# Patient Record
Sex: Male | Born: 2000
Health system: Southern US, Community
[De-identification: ages and names within clinical notes are randomized; demographics above are authoritative.]

## PROBLEM LIST (undated history)

## (undated) DIAGNOSIS — F32A Depression, unspecified: Secondary | ICD-10-CM

## (undated) DIAGNOSIS — K5090 Crohn's disease, unspecified, without complications: Secondary | ICD-10-CM

## (undated) DIAGNOSIS — T7840XA Allergy, unspecified, initial encounter: Secondary | ICD-10-CM

## (undated) DIAGNOSIS — H209 Unspecified iridocyclitis: Secondary | ICD-10-CM

## (undated) DIAGNOSIS — L309 Dermatitis, unspecified: Secondary | ICD-10-CM

## (undated) DIAGNOSIS — A281 Cat-scratch disease: Secondary | ICD-10-CM

## (undated) HISTORY — DX: Cat-scratch disease: A28.1

## (undated) HISTORY — DX: Allergy, unspecified, initial encounter: T78.40XA

## (undated) HISTORY — DX: Crohn's disease, unspecified, without complications: K50.90

---
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
## 2021-01-08 DIAGNOSIS — F4323 Adjustment disorder with mixed anxiety and depressed mood: Secondary | ICD-10-CM | POA: Diagnosis not present

## 2021-01-14 DIAGNOSIS — Z23 Encounter for immunization: Secondary | ICD-10-CM | POA: Diagnosis not present

## 2021-01-16 ENCOUNTER — Other Ambulatory Visit (HOSPITAL_BASED_OUTPATIENT_CLINIC_OR_DEPARTMENT_OTHER): Payer: Self-pay

## 2021-01-21 ENCOUNTER — Other Ambulatory Visit (INDEPENDENT_AMBULATORY_CARE_PROVIDER_SITE_OTHER): Payer: Self-pay | Admitting: Pediatric Gastroenterology

## 2021-01-21 MED FILL — buPROPion HCL ER (XL) 150 M: 150 | 30 days supply | Qty: 90 | Fill #1

## 2021-01-21 MED FILL — ESCITALOPRAM 10 MG TABLET: 10 | 30 days supply | Qty: 30 | Fill #2

## 2021-01-22 DIAGNOSIS — F4323 Adjustment disorder with mixed anxiety and depressed mood: Secondary | ICD-10-CM | POA: Diagnosis not present

## 2021-01-28 DIAGNOSIS — J343 Hypertrophy of nasal turbinates: Secondary | ICD-10-CM | POA: Diagnosis not present

## 2021-01-28 DIAGNOSIS — J310 Chronic rhinitis: Secondary | ICD-10-CM | POA: Diagnosis not present

## 2021-01-28 DIAGNOSIS — H9313 Tinnitus, bilateral: Secondary | ICD-10-CM | POA: Diagnosis not present

## 2021-01-29 ENCOUNTER — Other Ambulatory Visit (HOSPITAL_COMMUNITY): Payer: Self-pay

## 2021-02-02 ENCOUNTER — Other Ambulatory Visit (HOSPITAL_COMMUNITY): Payer: Self-pay

## 2021-02-02 MED FILL — Methotrexate Sodium Inj 50 MG/2ML (25 MG/ML): INTRAMUSCULAR | 28 days supply | Qty: 4 | Fill #0 | Status: AC

## 2021-02-16 ENCOUNTER — Encounter (INDEPENDENT_AMBULATORY_CARE_PROVIDER_SITE_OTHER): Payer: Self-pay

## 2021-02-16 ENCOUNTER — Telehealth (INDEPENDENT_AMBULATORY_CARE_PROVIDER_SITE_OTHER): Payer: Self-pay | Admitting: Pediatric Gastroenterology

## 2021-02-16 ENCOUNTER — Other Ambulatory Visit (HOSPITAL_COMMUNITY): Payer: Self-pay

## 2021-02-16 MED FILL — Escitalopram Oxalate Tab 10 MG (Base Equiv): ORAL | 30 days supply | Qty: 30 | Fill #0 | Status: AC

## 2021-02-16 MED FILL — Methotrexate Sodium Inj 50 MG/2ML (25 MG/ML): INTRAMUSCULAR | 84 days supply | Qty: 8 | Fill #1 | Status: AC

## 2021-02-16 MED FILL — Bupropion HCl Tab ER 24HR 150 MG: ORAL | 30 days supply | Qty: 90 | Fill #0 | Status: AC

## 2021-02-16 NOTE — Telephone Encounter (Signed)
Who's calling (name and relationship to patient) : [place] contact number: [phone]  Provider they see: Dr. Yehuda Savannah  Reason for call:  Pharmacy called in with questions regarding the rx dosing of methotrexate   Call ID:      PRESCRIPTION REFILL ONLY  Name of prescription: methotrexate 50 MG/2ML injection [[id number]]    Pharmacy:    [facility], [place] [address]  Phone:  [phone] Fax:  [phone]  DEA #:  [id number]

## 2021-02-16 NOTE — Telephone Encounter (Signed)
Returned call to pharmacy. Mom stated to the pharmacy that the 0.6 mL dose is not the right dose, and that it should be 0.8 mL. I relayed to the pharmacy that Billy Allen has not been seen since 06/16/2020, and according to the note, the dose is 15 mg (0.6 mL), so unless Billy Allen has seen another provider, the dose that is noted is 0.6 mL. Billy Allen has an appointment on 02/23/21, so the pharmacy is going to call mom and verify that he is not seeing another provider for the dosage change, and let them know that they need to keep their appointment next week.

## 2021-02-17 ENCOUNTER — Other Ambulatory Visit: Payer: Self-pay | Admitting: Otolaryngology

## 2021-02-17 NOTE — Telephone Encounter (Signed)
Mom sent back a My Chart message confirming dose. Billy Allen is taking the dosage as prescribed. Message from mom - Billy Allen is taking 15 mg which is .6 ml of methotrexate subcutaneous once a week. I think the refills in the past was a bit short. So this time we got 4 vials of 51m/2 ml so if none will be wasted during withdrawal then it should last 3 months.

## 2021-02-23 ENCOUNTER — Ambulatory Visit (INDEPENDENT_AMBULATORY_CARE_PROVIDER_SITE_OTHER): Payer: Self-pay | Admitting: Pediatric Gastroenterology

## 2021-02-25 ENCOUNTER — Ambulatory Visit (INDEPENDENT_AMBULATORY_CARE_PROVIDER_SITE_OTHER): Payer: 59 | Admitting: Adult Health

## 2021-02-25 ENCOUNTER — Encounter (INDEPENDENT_AMBULATORY_CARE_PROVIDER_SITE_OTHER): Payer: Self-pay

## 2021-02-25 ENCOUNTER — Other Ambulatory Visit (HOSPITAL_COMMUNITY): Payer: Self-pay

## 2021-02-25 ENCOUNTER — Other Ambulatory Visit (INDEPENDENT_AMBULATORY_CARE_PROVIDER_SITE_OTHER): Payer: Self-pay

## 2021-02-25 ENCOUNTER — Encounter: Payer: Self-pay | Admitting: Adult Health

## 2021-02-25 ENCOUNTER — Other Ambulatory Visit: Payer: Self-pay

## 2021-02-25 DIAGNOSIS — F331 Major depressive disorder, recurrent, moderate: Secondary | ICD-10-CM | POA: Diagnosis not present

## 2021-02-25 DIAGNOSIS — K5090 Crohn's disease, unspecified, without complications: Secondary | ICD-10-CM

## 2021-02-25 DIAGNOSIS — H209 Unspecified iridocyclitis: Secondary | ICD-10-CM | POA: Diagnosis not present

## 2021-02-25 MED ORDER — PREDNISOLONE ACETATE 1 % OP SUSP
OPHTHALMIC | 0 refills | Status: DC
  Filled 2021-02-25: qty 5, 4d supply, fill #0

## 2021-02-25 MED ORDER — ESCITALOPRAM OXALATE 10 MG PO TABS
ORAL_TABLET | Freq: Every day | ORAL | 5 refills | Status: DC
  Filled 2021-02-25: qty 30, 30d supply, fill #0
  Filled 2021-04-15: qty 30, 30d supply, fill #1

## 2021-02-25 MED ORDER — BUPROPION HCL ER (XL) 150 MG PO TB24
ORAL_TABLET | Freq: Every day | ORAL | 5 refills | Status: DC
  Filled 2021-02-25: qty 90, 30d supply, fill #0
  Filled 2021-04-15: qty 90, 30d supply, fill #1

## 2021-02-25 NOTE — Progress Notes (Signed)
Billy Allen [id number] [birth year] 20 y.o.  Subjective:   Patient ID:  Billy Allen is a 20 y.o. (DOB [birth year]) male.  Chief Complaint: No chief complaint on file.   HPI Billy Allen presents to the office today for follow-up of MDD.   Describes mood today as "ok". Denies tearfulness. Mood symptoms - reports decreased depression - "still somewhat there". Denies anxiety and irritability. Decreased intrusive thoughts. Has been journaling. Has set some goals, but has difficulties taking action. Stating "I have not set up an environment and lifestyle that would be conducive to mood maintenance". Plans to work on himself over the summer to be better prepared for fall semester. Seeing therapist at [place]. Taking medications as prescribed.  Energy levels stable. Active, has started going to the gym - taking some time off for finals.  Enjoys some usual interests and activities. Live alone in residence hall. Family local. Spending time with family and friends. Appetite adequate. Weight gain - noticing more body fat. Sleeps well most nights. Averages 5 to 6 hours with completing semester.  Focus and concentration about the same. Completing tasks. Managing aspects of household. Student - freshman at [employer]. Denies SI or HI. Passive ideation - "less" frequent. Denies AH or VH.  Previous medication trials: Denies  Review of Systems:  Review of Systems  Musculoskeletal: Negative for gait problem.  Neurological: Negative for tremors.  Psychiatric/Behavioral:       Please refer to HPI    Medications: I have reviewed the patient's current medications.  Current Outpatient Medications  Medication Sig Dispense Refill  . B-D 3CC LUER-LOK SYR 25GX5/8" 25G X 5/8" 3 ML MISC USE TO INJECT METHOTREXATE INTO THE SKIN ONCE A WEEK.    Marland Kitchen buPROPion (WELLBUTRIN XL) 150 MG 24 hr tablet TAKE 3 TABLETS BY MOUTH ONCE A DAY 90 tablet 5  . clindamycin-benzoyl peroxide (BENZACLIN) gel Apply 1  application topically daily.  3  . clindamycin-benzoyl peroxide (BENZACLIN) gel APPLY TO ACNE AREAS ON SKIN IN THE MORNING 50 g 11  . clobetasol ointment (TEMOVATE) 0.05 % APPLY TO THE AFFECTED AREA(S) TWICE DAILY 60 g 3  . Dapsone 5 % topical gel APPLY 1 APPLICATION ON THE SKIN IN THE MORNING 60 g 1  . dupilumab (DUPIXENT) 300 MG/2ML prefilled syringe Inject 300 mg into the skin once.    Marland Kitchen EPINEPHrine 0.3 mg/0.3 mL IJ SOAJ injection Inject into the muscle as directed.    . escitalopram (LEXAPRO) 10 MG tablet TAKE 1 TABLET BY MOUTH DAILY 30 tablet 5  . FOLIC ACID PO Take by mouth.    . levocetirizine (XYZAL) 5 MG tablet TAKE 1 TABLET BY MOUTH EVERY EVENING 30 tablet 5  . loratadine (CLARITIN REDITABS) 10 MG dissolvable tablet Take by mouth. (Patient not taking: Reported on 06/16/2020)    . methotrexate 50 MG/2ML injection INJECT 0.6 ML INTO THE SKIN ONCE A WEEK DISCARD VIAL 28 DAYS AFTER INITIAL USE 8 mL 1  . tacrolimus (PROTOPIC) 0.1 % ointment APPLY TO THE ECZEMA PRONE AREAS DAILY 60 g 2  . tretinoin (RETIN-A) 0.05 % cream Apply topically.    Marland Kitchen VITAMIN D PO Take by mouth.     No current facility-administered medications for this visit.    Medication Side Effects: None  Allergies: No Known Allergies  Past Medical History:  Diagnosis Date  . Crohn's disease ([place])   . Eczema     Past Medical History, Surgical history, Social history, and Family history were  reviewed and updated as appropriate.   Please see review of systems for further details on the patient's review from today.   Objective:   Physical Exam:  There were no vitals taken for this visit.  Physical Exam Constitutional:      General: He is not in acute distress. Musculoskeletal:        General: No deformity.  Neurological:     Mental Status: He is alert and oriented to person, place, and time.     Coordination: Coordination normal.  Psychiatric:        Attention and Perception: Attention and perception normal.  He does not perceive auditory or visual hallucinations.        Mood and Affect: Mood normal. Mood is not anxious or depressed. Affect is not labile, blunt, angry or inappropriate.        Speech: Speech normal.        Behavior: Behavior normal.        Thought Content: Thought content normal. Thought content is not paranoid or delusional. Thought content does not include homicidal or suicidal ideation. Thought content does not include homicidal or suicidal plan.        Cognition and Memory: Cognition and memory normal.        Judgment: Judgment normal.     Comments: Insight intact     Lab Review:     Component Value Date/Time   NA 141 10/27/2020 0925   K 3.8 10/27/2020 0925   CL 105 10/27/2020 0925   CO2 26 10/27/2020 0925   GLUCOSE 76 10/27/2020 0925   BUN 14 10/27/2020 0925   CREATININE 0.79 10/27/2020 0925   CALCIUM 9.8 10/27/2020 0925   PROT 7.4 10/27/2020 0925   ALBUMIN 3.0 (L) 11/24/2019 1315   AST 20 10/27/2020 0925   ALT 22 10/27/2020 0925   ALKPHOS 150 11/24/2019 1315   BILITOT 0.6 10/27/2020 0925   GFRNONAA NOT CALCULATED 11/26/2019 0649   GFRAA NOT CALCULATED 11/26/2019 0649       Component Value Date/Time   WBC 8.3 10/27/2020 0925   RBC 4.79 10/27/2020 0925   HGB 14.5 10/27/2020 0925   HCT 44.4 10/27/2020 0925   PLT 377 10/27/2020 0925   MCV 92.7 10/27/2020 0925   MCH 30.3 10/27/2020 0925   MCHC 32.7 10/27/2020 0925   RDW 12.1 10/27/2020 0925   LYMPHSABS 2,117 10/27/2020 0925   MONOABS 0.6 11/27/2019 1549   EOSABS 506 (H) 10/27/2020 0925   BASOSABS 91 10/27/2020 0925    No results found for: POCLITH, LITHIUM   No results found for: PHENYTOIN, PHENOBARB, VALPROATE, CBMZ   .res Assessment: Plan:     Plan:  1. Wellbutrin XL 469m daily. 2. Lexapro 110mdaily  Read and reviewed note with patient for accuracy.   RTC 8/10 weeks  Patient advised to contact office with any questions, adverse effects, or acute worsening in signs and  symptoms.     Diagnoses and all orders for this visit:  Major depressive disorder, recurrent episode, moderate (HCC) -     buPROPion (WELLBUTRIN XL) 150 MG 24 hr tablet; TAKE 3 TABLETS BY MOUTH ONCE A DAY -     escitalopram (LEXAPRO) 10 MG tablet; TAKE 1 TABLET BY MOUTH DAILY     Please see After Visit Summary for patient specific instructions.  Future Appointments  Date Time Provider DeR[place]8/01/2021  9:00 AM Brody Bonneau, ReBerdie OgrenNP CP-CP None  06/15/2021  2:30 PM SyKandis BanMD PS-[facility]  No orders of the defined types were placed in this encounter.   -------------------------------

## 2021-02-26 ENCOUNTER — Other Ambulatory Visit (INDEPENDENT_AMBULATORY_CARE_PROVIDER_SITE_OTHER): Payer: Self-pay

## 2021-02-26 DIAGNOSIS — K50118 Crohn's disease of large intestine with other complication: Secondary | ICD-10-CM

## 2021-02-26 DIAGNOSIS — K5090 Crohn's disease, unspecified, without complications: Secondary | ICD-10-CM

## 2021-02-26 LAB — COMPREHENSIVE METABOLIC PANEL
AG Ratio: 1.7 (calc) (ref 1.0–2.5)
ALT: 157 U/L — ABNORMAL HIGH (ref 8–46)
AST: 72 U/L — ABNORMAL HIGH (ref 12–32)
Albumin: 4.5 g/dL (ref 3.6–5.1)
Alkaline phosphatase (APISO): 112 U/L (ref 46–169)
BUN: 11 mg/dL (ref 7–20)
CO2: 26 mmol/L (ref 20–32)
Calcium: 9.4 mg/dL (ref 8.9–10.4)
Chloride: 104 mmol/L (ref 98–110)
Creat: 0.77 mg/dL (ref 0.60–1.26)
Globulin: 2.7 g/dL (ref 2.1–3.5)
Glucose, Bld: 112 mg/dL (ref 65–139)
Potassium: 4 mmol/L (ref 3.8–5.1)
Sodium: 140 mmol/L (ref 135–146)
Total Bilirubin: 0.3 mg/dL (ref 0.2–1.1)
Total Protein: 7.2 g/dL (ref 6.3–8.2)

## 2021-02-26 LAB — CBC WITH DIFFERENTIAL/PLATELET
Absolute Monocytes: 703 {cells}/uL (ref 200–900)
Basophils Absolute: 57 {cells}/uL (ref 0–200)
Basophils Relative: 0.6 %
Eosinophils Absolute: 333 {cells}/uL (ref 15–500)
Eosinophils Relative: 3.5 %
HCT: 39.4 % (ref 36.0–49.0)
Hemoglobin: 12.9 g/dL (ref 12.0–16.9)
Lymphs Abs: 2214 {cells}/uL (ref 1200–5200)
MCH: 31.2 pg (ref 25.0–35.0)
MCHC: 32.7 g/dL (ref 31.0–36.0)
MCV: 95.4 fL (ref 78.0–98.0)
MPV: 9.2 fL (ref 7.5–12.5)
Monocytes Relative: 7.4 %
Neutro Abs: 6194 {cells}/uL (ref 1800–8000)
Neutrophils Relative %: 65.2 %
Platelets: 347 10*3/uL (ref 140–400)
RBC: 4.13 10*6/uL (ref 4.10–5.70)
RDW: 11.4 % (ref 11.0–15.0)
Total Lymphocyte: 23.3 %
WBC: 9.5 10*3/uL (ref 4.5–13.0)

## 2021-02-26 LAB — SEDIMENTATION RATE: Sed Rate: 6 mm/h (ref 0–15)

## 2021-02-26 LAB — GAMMA GT: GGT: 123 U/L — ABNORMAL HIGH (ref 9–31)

## 2021-02-26 LAB — C-REACTIVE PROTEIN: CRP: 9.1 mg/L — ABNORMAL HIGH (ref <8.0)

## 2021-02-27 ENCOUNTER — Other Ambulatory Visit (HOSPITAL_COMMUNITY): Payer: Self-pay

## 2021-02-27 DIAGNOSIS — H209 Unspecified iridocyclitis: Secondary | ICD-10-CM | POA: Diagnosis not present

## 2021-02-27 DIAGNOSIS — K50118 Crohn's disease of large intestine with other complication: Secondary | ICD-10-CM | POA: Diagnosis not present

## 2021-02-27 MED ORDER — PREDNISOLONE ACETATE 1 % OP SUSP
OPHTHALMIC | 0 refills | Status: DC
  Filled 2021-02-27: qty 5, 7d supply, fill #0

## 2021-02-28 ENCOUNTER — Other Ambulatory Visit (HOSPITAL_COMMUNITY): Payer: Self-pay

## 2021-03-03 ENCOUNTER — Other Ambulatory Visit (HOSPITAL_COMMUNITY): Payer: Self-pay

## 2021-03-04 ENCOUNTER — Encounter (HOSPITAL_BASED_OUTPATIENT_CLINIC_OR_DEPARTMENT_OTHER): Payer: Self-pay | Admitting: Otolaryngology

## 2021-03-04 ENCOUNTER — Other Ambulatory Visit: Payer: Self-pay

## 2021-03-06 ENCOUNTER — Other Ambulatory Visit (HOSPITAL_COMMUNITY)
Admission: RE | Admit: 2021-03-06 | Discharge: 2021-03-06 | Disposition: A | Discharge status: Home / Self Care | Payer: 59 | Source: Ambulatory Visit | Attending: Otolaryngology | Admitting: Otolaryngology

## 2021-03-06 DIAGNOSIS — Z20822 Contact with and (suspected) exposure to covid-19: Secondary | ICD-10-CM | POA: Diagnosis not present

## 2021-03-06 DIAGNOSIS — Z01812 Encounter for preprocedural laboratory examination: Secondary | ICD-10-CM | POA: Insufficient documentation

## 2021-03-06 DIAGNOSIS — H209 Unspecified iridocyclitis: Secondary | ICD-10-CM | POA: Diagnosis not present

## 2021-03-06 DIAGNOSIS — H16002 Unspecified corneal ulcer, left eye: Secondary | ICD-10-CM | POA: Diagnosis not present

## 2021-03-07 LAB — CALPROTECTIN: Calprotectin: 1710 ug/g — ABNORMAL HIGH

## 2021-03-07 LAB — SARS CORONAVIRUS 2 (TAT 6-24 HRS): SARS Coronavirus 2: NEGATIVE

## 2021-03-10 ENCOUNTER — Ambulatory Visit (HOSPITAL_BASED_OUTPATIENT_CLINIC_OR_DEPARTMENT_OTHER): Payer: 59 | Admitting: Anesthesiology

## 2021-03-10 ENCOUNTER — Ambulatory Visit (HOSPITAL_BASED_OUTPATIENT_CLINIC_OR_DEPARTMENT_OTHER)
Admission: RE | Admit: 2021-03-10 | Discharge: 2021-03-10 | Disposition: A | Discharge status: Home / Self Care | Payer: 59 | Attending: Otolaryngology | Admitting: Otolaryngology

## 2021-03-10 ENCOUNTER — Other Ambulatory Visit: Payer: Self-pay

## 2021-03-10 ENCOUNTER — Encounter (HOSPITAL_BASED_OUTPATIENT_CLINIC_OR_DEPARTMENT_OTHER)
Admission: RE | Disposition: A | Discharge status: Home / Self Care | Payer: Self-pay | Source: Home / Self Care | Attending: Otolaryngology

## 2021-03-10 ENCOUNTER — Encounter (HOSPITAL_BASED_OUTPATIENT_CLINIC_OR_DEPARTMENT_OTHER): Payer: Self-pay | Admitting: Otolaryngology

## 2021-03-10 ENCOUNTER — Other Ambulatory Visit (HOSPITAL_COMMUNITY): Payer: Self-pay

## 2021-03-10 DIAGNOSIS — J343 Hypertrophy of nasal turbinates: Secondary | ICD-10-CM | POA: Insufficient documentation

## 2021-03-10 DIAGNOSIS — J310 Chronic rhinitis: Secondary | ICD-10-CM | POA: Insufficient documentation

## 2021-03-10 DIAGNOSIS — J3489 Other specified disorders of nose and nasal sinuses: Secondary | ICD-10-CM | POA: Diagnosis not present

## 2021-03-10 DIAGNOSIS — F32A Depression, unspecified: Secondary | ICD-10-CM | POA: Diagnosis not present

## 2021-03-10 DIAGNOSIS — L309 Dermatitis, unspecified: Secondary | ICD-10-CM | POA: Diagnosis not present

## 2021-03-10 HISTORY — DX: Unspecified iridocyclitis: H20.9

## 2021-03-10 HISTORY — DX: Depression, unspecified: F32.A

## 2021-03-10 HISTORY — PX: TURBINATE REDUCTION: SHX6157

## 2021-03-10 SURGERY — REDUCTION, NASAL TURBINATE
Anesthesia: General | Site: Nose | Laterality: Bilateral

## 2021-03-10 MED ORDER — PROMETHAZINE HCL 25 MG/ML IJ SOLN
6.2500 mg | INTRAMUSCULAR | Status: DC | PRN
Start: 2021-03-10 — End: 2021-03-10

## 2021-03-10 MED ORDER — CEFAZOLIN SODIUM-DEXTROSE 2-3 GM-%(50ML) IV SOLR
INTRAVENOUS | Status: DC | PRN
  Administered 2021-03-10: 2 g via INTRAVENOUS

## 2021-03-10 MED ORDER — ONDANSETRON HCL 4 MG/2ML IJ SOLN
INTRAMUSCULAR | Status: DC | PRN
  Administered 2021-03-10: 4 mg via INTRAVENOUS

## 2021-03-10 MED ORDER — OXYCODONE-ACETAMINOPHEN 5-325 MG PO TABS
1.0000 | ORAL_TABLET | ORAL | 0 refills | Status: AC | PRN
  Filled 2021-03-10: qty 12, 2d supply, fill #0

## 2021-03-10 MED ORDER — ONDANSETRON HCL 4 MG/2ML IJ SOLN
INTRAMUSCULAR | Status: AC
  Filled 2021-03-10: qty 2

## 2021-03-10 MED ORDER — MEPERIDINE HCL 25 MG/ML IJ SOLN: 6.2500 mg | INTRAMUSCULAR | Status: DC | PRN

## 2021-03-10 MED ORDER — OXYCODONE HCL 5 MG PO TABS: 5.0000 mg | ORAL_TABLET | Freq: Once | ORAL | Status: DC | PRN

## 2021-03-10 MED ORDER — OXYCODONE HCL 5 MG/5ML PO SOLN: 5.0000 mg | Freq: Once | ORAL | Status: DC | PRN

## 2021-03-10 MED ORDER — DEXAMETHASONE SODIUM PHOSPHATE 4 MG/ML IJ SOLN
INTRAMUSCULAR | Status: DC | PRN
  Administered 2021-03-10: 10 mg via INTRAVENOUS

## 2021-03-10 MED ORDER — AMISULPRIDE (ANTIEMETIC) 5 MG/2ML IV SOLN: 10.0000 mg | Freq: Once | INTRAVENOUS | Status: DC | PRN

## 2021-03-10 MED ORDER — PROPOFOL 500 MG/50ML IV EMUL
INTRAVENOUS | Status: AC
  Filled 2021-03-10: qty 50

## 2021-03-10 MED ORDER — MIDAZOLAM HCL 2 MG/2ML IJ SOLN
INTRAMUSCULAR | Status: AC
  Filled 2021-03-10: qty 2

## 2021-03-10 MED ORDER — PROPOFOL 10 MG/ML IV BOLUS
INTRAVENOUS | Status: DC | PRN
  Administered 2021-03-10: 150 mg via INTRAVENOUS

## 2021-03-10 MED ORDER — FENTANYL CITRATE (PF) 100 MCG/2ML IJ SOLN
INTRAMUSCULAR | Status: DC | PRN
  Administered 2021-03-10: 100 ug via INTRAVENOUS

## 2021-03-10 MED ORDER — OXYMETAZOLINE HCL 0.05 % NA SOLN
NASAL | Status: DC | PRN
  Administered 2021-03-10: 1 via TOPICAL

## 2021-03-10 MED ORDER — FENTANYL CITRATE (PF) 100 MCG/2ML IJ SOLN
INTRAMUSCULAR | Status: AC
  Filled 2021-03-10: qty 2

## 2021-03-10 MED ORDER — HYDROMORPHONE HCL 1 MG/ML IJ SOLN: 0.2500 mg | INTRAMUSCULAR | Status: DC | PRN

## 2021-03-10 MED ORDER — DEXAMETHASONE SODIUM PHOSPHATE 10 MG/ML IJ SOLN
INTRAMUSCULAR | Status: AC
  Filled 2021-03-10: qty 1

## 2021-03-10 MED ORDER — MIDAZOLAM HCL 5 MG/5ML IJ SOLN
INTRAMUSCULAR | Status: DC | PRN
  Administered 2021-03-10: 2 mg via INTRAVENOUS

## 2021-03-10 MED ORDER — LIDOCAINE HCL (CARDIAC) PF 100 MG/5ML IV SOSY
PREFILLED_SYRINGE | INTRAVENOUS | Status: DC | PRN
  Administered 2021-03-10: 100 mg via INTRAVENOUS

## 2021-03-10 MED ORDER — LACTATED RINGERS IV SOLN: INTRAVENOUS | Status: DC

## 2021-03-10 MED ORDER — SUCCINYLCHOLINE CHLORIDE 20 MG/ML IJ SOLN
INTRAMUSCULAR | Status: DC | PRN
  Administered 2021-03-10: 120 mg via INTRAVENOUS

## 2021-03-10 SURGICAL SUPPLY — 21 items
ATTRACTOMAT 16X20 MAGNETIC DRP (DRAPES) IMPLANT
CANISTER SUCT 1200ML W/VALVE (MISCELLANEOUS) ×2 IMPLANT
COAGULATOR SUCT 8FR VV (MISCELLANEOUS) ×2 IMPLANT
COVER WAND RF STERILE (DRAPES) IMPLANT
DECANTER SPIKE VIAL GLASS SM (MISCELLANEOUS) IMPLANT
ELECT REM PT RETURN 9FT ADLT (ELECTROSURGICAL) ×2 IMPLANT
ELECTRODE REM PT RTRN 9FT ADLT (ELECTROSURGICAL) ×1 IMPLANT
GLOVE SURG ENC MOIS LTX SZ7.5 (GLOVE) ×4 IMPLANT
GOWN STRL REUS W/ TWL LRG LVL3 (GOWN DISPOSABLE) ×2 IMPLANT
GOWN STRL REUS W/TWL LRG LVL3 (GOWN DISPOSABLE) ×4
NEEDLE HYPO 25X1 1.5 SAFETY (NEEDLE) IMPLANT
NS IRRIG 1000ML POUR BTL (IV SOLUTION) ×2 IMPLANT
PACK BASIN DAY SURGERY FS (CUSTOM PROCEDURE TRAY) ×2 IMPLANT
PACK ENT DAY SURGERY (CUSTOM PROCEDURE TRAY) ×2 IMPLANT
PATTIES SURGICAL .5 X3 (DISPOSABLE) ×2 IMPLANT
SLEEVE SCD COMPRESS KNEE MED (STOCKING) IMPLANT
SOLUTION BUTLER CLEAR DIP (MISCELLANEOUS) ×2 IMPLANT
SPONGE GAUZE 2X2 8PLY STRL LF (GAUZE/BANDAGES/DRESSINGS) ×2 IMPLANT
SPONGE NEURO XRAY DETECT 1X3 (DISPOSABLE) ×2 IMPLANT
TOWEL GREEN STERILE FF (TOWEL DISPOSABLE) ×2 IMPLANT
YANKAUER SUCT BULB TIP NO VENT (SUCTIONS) ×2 IMPLANT

## 2021-03-10 NOTE — H&P (Signed)
Cc: Chronic nasal obstruction  HPI: The patient is an 20 year old male who presents today complaining of chronic nasal obstruction.  According to the patient, he has been symptomatic most of his life. He was previously treated by his allergist, Dr. Orvil Feil, with 2 years of immunotherapy.  He was also on steroid nasal sprays and allergy medications.  However, he continues to be symptomatic.  He has significant difficulty breathing through his nostrils.  He has been a habitual mouth breather.  He has no previous history of ENT surgery.  In addition, he also complains of bilateral high pitched tinnitus.  The tinnitus is constant, worse at night.  He denies any otitis media or otitis externa. Currently he has no otalgia, otorrhea or vertigo.   The patient's review of systems (constitutional, eyes, ENT, cardiovascular, respiratory, GI, musculoskeletal, skin, neurologic, psychiatric, endocrine, hematologic, allergic) is noted in the ROS questionnaire.  It is reviewed with the patient and his mother.  Major events: None.  Ongoing medical problems: Depression, anxiety, allergies, autoimmune disorder, Crohn's disease.  Family health history: No HTN, DM, CAD, hearing loss or bleeding disorder.  Social history: The patient is single. He denies the use of tobacco, alcohol or illegal drugs..    Exam: General: Communicates without difficulty, well nourished, no acute distress. Head: Normocephalic, no evidence injury, no tenderness, facial buttresses intact without stepoff. Face/sinus: No tenderness to palpation and percussion. Facial movement is normal and symmetric. Eyes: PERRL, EOMI. No scleral icterus, conjunctivae clear. Neuro: CN II exam reveals vision grossly intact.  No nystagmus at any point of gaze. Ears: Auricles well formed without lesions.  Ear canals are intact without mass or lesion.  No erythema or edema is appreciated.  The TMs are intact without fluid. Nose: External evaluation reveals normal support  and skin without lesions.  Dorsum is intact.  Anterior rhinoscopy reveals congested mucosa over anterior aspect of inferior turbinates and intact septum.  No purulence noted. Oral:  Oral cavity and oropharynx are intact, symmetric, without erythema or edema.  Mucosa is moist without lesions. Neck: Full range of motion without pain.  There is no significant lymphadenopathy.  No masses palpable.  Thyroid bed within normal limits to palpation.  Parotid glands and submandibular glands equal bilaterally without mass.  Trachea is midline. Neuro:  CN 2-12 grossly intact. Gait normal.   AUDIOMETRIC TESTING:  I reviewed the audiometric result. The test shows normal hearing bilaterally across all frequencies. The speech reception threshold is 5dB AD and 5dB AS. The discrimination score is 100% AD and 100% AS. The tympanogram is normal bilaterally.   Assessment  1.  Chronic rhinitis with nasal mucosal congestion, and bilateral severe inferior turbinate hypertrophy.  More than 95% of his nasal passageways are obstructed bilaterally.  No polyps, mass or lesions are noted today.  2.  Subjective tinnitus.  3.  The patient's ear canals, tympanic membranes, and middle ear spaces are all normal. His hearing is normal bilaterally across all frequencies.   Plan  1.  The physical exam and nasal endoscopy findings are reviewed with the patient.  2.  The patient should continue with Flonase nasal spray and allergy medications.  3.  In light of his persistent symptoms, he may benefit from surgical intervention with bilateral turbinate reduction.  The risks, benefits, alternatives and details of the procedures are reviewed with the patient and his mother.   4.  The patient would like to proceed with the procedure.

## 2021-03-10 NOTE — Discharge Instructions (Signed)
Post Anesthesia Home Care Instructions  Activity: Get plenty of rest for the remainder of the day. A responsible individual must stay with you for 24 hours following the procedure.  For the next 24 hours, DO NOT: -Drive a car -Paediatric nurse -Drink alcoholic beverages -Take any medication unless instructed by your physician -Make any legal decisions or sign important papers.  Meals: Start with liquid foods such as gelatin or soup. Progress to regular foods as tolerated. Avoid greasy, spicy, heavy foods. If nausea and/or vomiting occur, drink only clear liquids until the nausea and/or vomiting subsides. Call your physician if vomiting continues.  Special Instructions/Symptoms: Your throat may feel dry or sore from the anesthesia or the breathing tube placed in your throat during surgery. If this causes discomfort, gargle with warm salt water. The discomfort should disappear within 24 hours.  If you had a scopolamine patch placed behind your ear for the management of post- operative nausea and/or vomiting:  1. The medication in the patch is effective for 72 hours, after which it should be removed.  Wrap patch in a tissue and discard in the trash. Wash hands thoroughly with soap and water. 2. You may remove the patch earlier than 72 hours if you experience unpleasant side effects which may include dry mouth, dizziness or visual disturbances. 3. Avoid touching the patch. Wash your hands with soap and water after contact with the patch.    -----------------  POSTOPERATIVE INSTRUCTIONS FOR PATIENTS HAVING NASAL OR SINUS OPERATIONS ACTIVITY: Restrict activity at home for the first two days, resting as much as possible. Light activity is best. You may usually return to work within a week. You should refrain from nose blowing, strenuous activity, or heavy lifting greater than 20lbs for a total of one week after your operation.  If sneezing cannot be avoided, sneeze with your mouth  open. DISCOMFORT: You may experience a dull headache and pressure along with nasal congestion and discharge. These symptoms may be worse during the first week after the operation but may last as long as two to four weeks.  Please take Tylenol or the pain medication that has been prescribed for you. Do not take aspirin or aspirin containing medications since they may cause bleeding.  You may experience symptoms of post nasal drainage, nasal congestion, headaches and fatigue for two or three months after your operation.  BLEEDING: You may have some blood tinged nasal drainage for approximately two weeks after the operation.  The discharge will be worse for the first week.  Please call our office at [phone] or go to the nearest hospital emergency room if you experience any of the following: heavy, bright red blood from your nose or mouth that lasts longer than 15 minutes or coughing up or vomiting bright red blood or blood clots. GENERAL CONSIDERATIONS: 1. A gauze dressing will be placed on your upper lip to absorb any drainage after the operation. You may need to change this several times a day.  If you do not have very much drainage, you may remove the dressing.  Remember that you may gently wipe your nose with a tissue and sniff in, but DO NOT blow your nose. 2. Please keep all of your postoperative appointments.  Your final results after the operation will depend on proper follow-up.  The initial visit is usually 2 to 5 days after the operation.  During this visit, the remaining nasal packing and internal septal splints will be removed.  Your nasal and sinus cavities will  be cleaned.  During the second visit, your nasal and sinus cavities will be cleaned again. Have someone drive you to your first two postoperative appointments.  3. How you care for your nose after the operation will influence the results that you obtain.  You should follow all directions, take your medication as prescribed, and call  our office [phone] with any problems or questions. 4. You may be more comfortable sleeping with your head elevated on two pillows. 5. Do not take any medications that we have not prescribed or recommended. WARNING SIGNS: if any of the following should occur, please call our office: 1. Persistent fever greater than 102F. 2. Persistent vomiting. 3. Severe and constant pain that is not relieved by prescribed pain medication. 4. Trauma to the nose. 5. Rash or unusual side effects from any medicines.

## 2021-03-10 NOTE — Anesthesia Preprocedure Evaluation (Signed)
Anesthesia Evaluation  Patient identified by MRN, date of birth, ID band Patient awake    Reviewed: Allergy & Precautions, NPO status , Patient's Chart, lab work & pertinent test results  Airway Mallampati: II  TM Distance: >3 FB Neck ROM: Full    Dental no notable dental hx.    Pulmonary neg pulmonary ROS,    Pulmonary exam normal breath sounds clear to auscultation       Cardiovascular negative cardio ROS Normal cardiovascular exam Rhythm:Regular Rate:Normal     Neuro/Psych Depression negative neurological ROS  negative psych ROS   GI/Hepatic negative GI ROS, Neg liver ROS,   Endo/Other  negative endocrine ROS  Renal/GU negative Renal ROS  negative genitourinary   Musculoskeletal negative musculoskeletal ROS (+)   Abdominal (+) + obese,   Peds negative pediatric ROS (+)  Hematology negative hematology ROS (+)   Anesthesia Other Findings   Reproductive/Obstetrics negative OB ROS                             Anesthesia Physical Anesthesia Plan  ASA: II  Anesthesia Plan: General   Post-op Pain Management:    Induction: Intravenous  PONV Risk Score and Plan: 2 and Ondansetron, Midazolam and Treatment may vary due to age or medical condition  Airway Management Planned: Oral ETT  Additional Equipment:   Intra-op Plan:   Post-operative Plan: Extubation in OR  Informed Consent: I have reviewed the patients History and Physical, chart, labs and discussed the procedure including the risks, benefits and alternatives for the proposed anesthesia with the patient or authorized representative who has indicated his/her understanding and acceptance.     Dental advisory given  Plan Discussed with: CRNA  Anesthesia Plan Comments:         Anesthesia Quick Evaluation

## 2021-03-10 NOTE — Transfer of Care (Signed)
Immediate Anesthesia Transfer of Care Note  Patient: Billy Allen  Procedure(s) Performed: BILATERAL TURBINATE REDUCTION (Bilateral Nose)  Patient Location: PACU  Anesthesia Type:General  Level of Consciousness: sedated  Airway & Oxygen Therapy: Patient Spontanous Breathing and Patient connected to face mask oxygen  Post-op Assessment: Report given to RN and Post -op Vital signs reviewed and stable  Post vital signs: Reviewed and stable  Last Vitals:  Vitals Value Taken Time  BP    Temp    Pulse    Resp 15 03/10/21 0854  SpO2    Vitals shown include unvalidated device data.  Last Pain:  Vitals:   03/10/21 0652  TempSrc: Oral  PainSc: 0-No pain      Patients Stated Pain Goal: 3 (01/12/21 4825)  Complications: No complications documented.

## 2021-03-10 NOTE — Anesthesia Postprocedure Evaluation (Signed)
Anesthesia Post Note  Patient: Billy Allen  Procedure(s) Performed: BILATERAL TURBINATE REDUCTION (Bilateral Nose)     Patient location during evaluation: PACU Anesthesia Type: General Level of consciousness: awake and alert Pain management: pain level controlled Vital Signs Assessment: post-procedure vital signs reviewed and stable Respiratory status: spontaneous breathing, nonlabored ventilation and respiratory function stable Cardiovascular status: blood pressure returned to baseline and stable Postop Assessment: no apparent nausea or vomiting Anesthetic complications: no   No complications documented.  Last Vitals:  Vitals:   03/10/21 0915 03/10/21 0951  BP: (!) 146/78 135/90  Pulse: 96 78  Resp: 14 16  Temp:  36.7 C  SpO2: 98% 95%    Last Pain:  Vitals:   03/10/21 0951  TempSrc:   PainSc: 0-No pain                 Lynda Rainwater

## 2021-03-10 NOTE — Op Note (Signed)
DATE OF PROCEDURE: 03/10/2021  OPERATIVE REPORT   SURGEON: Leta Baptist, MD   PREOPERATIVE DIAGNOSES:  1. Chronic nasal obstruction.  2. Bilateral inferior turbinate hypertrophy.   POSTOPERATIVE DIAGNOSES:  1. Chronic nasal obstruction.  2. Bilateral inferior turbinate hypertrophy.   PROCEDURE PERFORMED: Bilateral partial inferior turbinate resection.   ANESTHESIA: General endotracheal tube anesthesia.   COMPLICATIONS: None.   ESTIMATED BLOOD LOSS: Minimal.   INDICATION FOR PROCEDURE :Billy Allen is a 20 y.o. male with a history of chronic nasal obstruction. The patient was treated with immunotherapy, antihistamine, decongestant, and steroid nasal sprays. However, the patient continued to be symptomatic. On examination, the patient was noted to have bilateral severe inferior turbinate hypertrophy, causing significant nasal obstruction. Based on the above findings, the decision was made for the patient to undergo the above-stated procedure. The risks, benefits, alternatives, and details of the procedure were discussed with the patient. Questions were invited and answered. Informed consent was obtained.   DESCRIPTION: The patient was taken to the operating room and placed supine on the operating table. General endotracheal tube anesthesia was administered by the anesthesiologist. The patient was positioned and prepped and draped in a standard fashion for nasal surgery. Pledgets soaked with Afrin were placed in both nasal cavities. The pledgets were subsequently removed. Examination of the nasal cavities revealed bilateral severe inferior turbinate hypertrophy. The inferior one-half of each inferior turbinate was then crossclamped with a straight Kelly clamp. The inferior one-half of each inferior turbinate was then resected with a pair of cross cutting scissors. Hemostasis was achieved with suction electrocautery, under direct visual guidance of the zero-degree endoscope. Good hemostasis was  achieved. The care of the patient was turned over to the anesthesiologist. The patient was awakened from anesthesia without difficulty. The patient was extubated and transferred to the recovery room in good condition.   OPERATIVE FINDINGS: Bilateral inferior turbinate hypertrophy.   SPECIMEN: None.   FOLLOWUP CARE: The patient will be discharged home once he is awake and alert. The patient will be placed on percocet p.r.n. pain, and amoxicillin 875 mg p.o. b.i.d. for 3 days. The patient will follow up in my office in approximately 1 week.   Leta Baptist, MD

## 2021-03-10 NOTE — Anesthesia Procedure Notes (Signed)
Procedure Name: Intubation Performed by: Tawni Millers, CRNA Pre-anesthesia Checklist: Patient identified, Emergency Drugs available, Suction available and Patient being monitored Patient Re-evaluated:Patient Re-evaluated prior to induction Oxygen Delivery Method: Circle system utilized Preoxygenation: Pre-oxygenation with 100% oxygen Induction Type: IV induction Ventilation: Mask ventilation without difficulty Laryngoscope Size: Mac and 3 Grade View: Grade II Tube type: Oral Rae Number of attempts: 1 Airway Equipment and Method: Stylet and Oral airway Placement Confirmation: ETT inserted through vocal cords under direct vision,  positive ETCO2 and breath sounds checked- equal and bilateral Tube secured with: Tape Dental Injury: Teeth and Oropharynx as per pre-operative assessment

## 2021-03-11 ENCOUNTER — Ambulatory Visit (INDEPENDENT_AMBULATORY_CARE_PROVIDER_SITE_OTHER): Payer: 59 | Admitting: Mental Health

## 2021-03-11 ENCOUNTER — Other Ambulatory Visit (HOSPITAL_COMMUNITY): Payer: Self-pay

## 2021-03-11 DIAGNOSIS — F331 Major depressive disorder, recurrent, moderate: Secondary | ICD-10-CM

## 2021-03-11 NOTE — Progress Notes (Signed)
[facility] Counselor Initial Adult Exam  Name: Billy Allen Date: 03/11/2021 MRN: [id number] DOB: December 06, 2000 PCP: Sydell Axon, MD  Time spent: 50 minutes  Reason for Visit Tyna Jaksch Problem: Patient stated he was in therapy last year at [facility]. He identified coping w/ "self-hating, instant gratification addiction". He stated our current culture reinforces this partially. He stated college was "a pretty rough time". He has consistently taken his rx'd wellbutrin, he is uncertain if how helpful it has been, due to his sharing that he has not made lifestyle changes "I have a plan to get rid of instantly gratifying things and replace them other things that are helpful like journaling". He can go a few days but then "slips" back to porn, junk food, social media. 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 He has been feeling this way since high school. The end of the first year of college was rough, he lost a close friend due to a falling out. He has a couple of other friends, but is often alone; others have "massive friend groups". He lives alone, considering getting a roommate Due to his medical issues, he gets a his own room accomodations. He was overweight as a child, began swimming in 8th grade to lose weight which worked. He stated his mother was soothing and would often come to his aid to avoid upsetting events, such as avoiding a swim meeting. He feels this connects to his instant gratification tendencies today.  He grew up in this area, not many friends in high school.  He wants more friends  Mental Status Exam:    Appearance:    Casual     Behavior:   Appropriate  Motor:   WNL  Speech/Language:    Clear and Coherent  Affect:   Full range   Mood:   Euthymic  Thought process:   normal  Thought content:     WNL  Sensory/Perceptual disturbances:     none  Orientation:   x4  Attention:   Good  Concentration:   Good  Memory:   Intact  Fund of knowledge:    Consistent with  age and development  Insight:     Good  Judgment:    Good  Impulse Control:   Good     Reported Symptoms:  Low motivation / procrastination, negative talk-"you should be doing this", compulsive bx's at times  Risk Assessment: Danger to Self:  No Self-injurious Behavior: No Danger to Others: No Duty to Warn:no Physical Aggression / Violence:No  Access to Firearms a concern: No  Gang Involvement:No  Patient / guardian was educated about steps to take if suicide or homicide risk level increases between visits: yes While future psychiatric events cannot be accurately predicted, the patient does not currently require acute inpatient psychiatric care and does not currently meet [facility] involuntary commitment criteria.  Substance Abuse History: Current substance abuse: No     Past Psychiatric History:   Outpatient Providers: [facility] History of Psych Hospitalization: No  Psychological Testing: none  Family History: Raised by his mother, has an older sister-age 66.  Never saw his father until 2 years ago, they went out to dinner for his birthday.  He currently lives w/ his mother, Mariana Single, aunts, uncles Family History  Problem Relation Age of Onset  . Hypertension Maternal Grandmother   . Cancer Maternal Grandfather   . Hypertension Maternal Grandfather   . Hyperlipidemia Maternal Grandfather   . Glaucoma Maternal Grandfather    Medical History/Surgical History: Past  Medical History:  Diagnosis Date  . Crohn's disease ([place])   . Depression   . Eczema   . Uveitis     No past surgical history on file.     Diagnoses:    ICD-10-CM   1. Major depressive disorder, recurrent episode, moderate ([place])  F33.1     Plan of Care: TBD   [place], [facility]

## 2021-03-12 ENCOUNTER — Other Ambulatory Visit (HOSPITAL_COMMUNITY): Payer: Self-pay

## 2021-03-12 DIAGNOSIS — J301 Allergic rhinitis due to pollen: Secondary | ICD-10-CM | POA: Diagnosis not present

## 2021-03-12 DIAGNOSIS — J3089 Other allergic rhinitis: Secondary | ICD-10-CM | POA: Diagnosis not present

## 2021-03-12 DIAGNOSIS — J3081 Allergic rhinitis due to animal (cat) (dog) hair and dander: Secondary | ICD-10-CM | POA: Diagnosis not present

## 2021-03-13 ENCOUNTER — Encounter (HOSPITAL_BASED_OUTPATIENT_CLINIC_OR_DEPARTMENT_OTHER): Payer: Self-pay | Admitting: Otolaryngology

## 2021-03-13 ENCOUNTER — Encounter (INDEPENDENT_AMBULATORY_CARE_PROVIDER_SITE_OTHER): Payer: Self-pay

## 2021-03-13 ENCOUNTER — Other Ambulatory Visit (HOSPITAL_COMMUNITY): Payer: Self-pay

## 2021-03-13 MED ORDER — PREDNISOLONE ACETATE 1 % OP SUSP
OPHTHALMIC | 0 refills | Status: DC
  Filled 2021-03-13: qty 5, 7d supply, fill #0

## 2021-03-13 MED ORDER — PREDNISOLONE ACETATE 1 % OP SUSP
OPHTHALMIC | 0 refills | Status: DC
  Filled 2021-03-13: qty 5, 5d supply, fill #0

## 2021-03-16 ENCOUNTER — Encounter (INDEPENDENT_AMBULATORY_CARE_PROVIDER_SITE_OTHER): Payer: Self-pay | Admitting: *Deleted

## 2021-03-19 ENCOUNTER — Other Ambulatory Visit: Payer: Self-pay

## 2021-03-19 ENCOUNTER — Ambulatory Visit (INDEPENDENT_AMBULATORY_CARE_PROVIDER_SITE_OTHER): Payer: 59 | Admitting: Mental Health

## 2021-03-19 DIAGNOSIS — F331 Major depressive disorder, recurrent, moderate: Secondary | ICD-10-CM | POA: Diagnosis not present

## 2021-03-19 NOTE — Progress Notes (Signed)
[facility] Counselor psychotherapy note  Name: Billy Allen Date: 03/19/2021 MRN: [id number] DOB: 04/04/2001 PCP: Sydell Axon, MD  Time spent: 53 minutes  Treatment:   Individual therapy  Mental Status Exam:    Appearance:    Casual     Behavior:   Appropriate  Motor:   WNL  Speech/Language:    Clear and Coherent  Affect:   Full range   Mood:   Euthymic  Thought process:   normal  Thought content:     WNL  Sensory/Perceptual disturbances:     none  Orientation:   x4  Attention:   Good  Concentration:   Good  Memory:   Intact  Fund of knowledge:    Consistent with age and development  Insight:     Good  Judgment:    Good  Impulse Control:   Good     Reported Symptoms:  Low motivation / procrastination, negative talk-"you should be doing this", compulsive bx's at times  Risk Assessment: Danger to Self:  No Self-injurious Behavior: No Danger to Others: No Duty to Warn:no Physical Aggression / Violence:No  Access to Firearms a concern: No  Gang Involvement:No  Patient / guardian was educated about steps to take if suicide or homicide risk level increases between visits: yes While future psychiatric events cannot be accurately predicted, the patient does not currently require acute inpatient psychiatric care and does not currently meet [facility] involuntary commitment criteria.  Substance Abuse History: Current substance abuse: No     Past Psychiatric History:   Outpatient Providers: [facility] History of Psych Hospitalization: No  Psychological Testing: none  Family History: Raised by his mother, has an older sister-age 67.  Never saw his father until 2 years ago, they went out to dinner for his birthday.  He currently lives w/ his mother, Billy Allen, aunts, uncles Family History  Problem Relation Age of Onset  . Hypertension Maternal Grandmother   . Cancer Maternal Grandfather   . Hypertension Maternal Grandfather   . Hyperlipidemia Maternal Grandfather   .  Glaucoma Maternal Grandfather    Medical History/Surgical History: Past Medical History:  Diagnosis Date  . Crohn's disease ([place])   . Depression   . Eczema   . Uveitis     Past Surgical History:  Procedure Laterality Date  . TURBINATE REDUCTION Bilateral 03/10/2021   Procedure: BILATERAL TURBINATE REDUCTION;  Surgeon: Leta Baptist, MD;  Location: [place];  Service: ENT;  Laterality: Bilateral;      Abuse History: Victim -none reported Report needed: No. Victim of Neglect:No. Perpetrator of -none  Witness / Exposure to Domestic Violence: No   Protective Services Involvement: No  Witness to [employer] Violence:  No   Family History:  Family History  Problem Relation Age of Onset  . Hypertension Maternal Grandmother   . Cancer Maternal Grandfather   . Hypertension Maternal Grandfather   . Hyperlipidemia Maternal Grandfather   . Glaucoma Maternal Grandfather     Social History:  Social History   Socioeconomic History  . Marital status: Allen    Spouse name: Not on file  . Number of children: Not on file  . Years of education: Not on file  . Highest education level: Not on file  Occupational History  . Not on file  Tobacco Use  . Smoking status: Never Smoker  . Smokeless tobacco: Never Used  Vaping Use  . Vaping Use: Never used  Substance and Sexual Activity  . Alcohol use: Never  .  Drug use: Never  . Sexual activity: Never  Other Topics Concern  . Not on file  Social History Narrative   No contact with father. Lives with mom, sister, Maternal grandparents, aunt, uncle, 1 dog Graduated from [employer]. Going to go to [employer] for college.   Social Determinants of Health   Financial Resource Strain: Not on file  Food Insecurity: Not on file  Transportation Needs: Not on file  Physical Activity: Not on file  Stress: Not on file  Social Connections: Not on file      Sexual Orientation:  hetero  Relationship Status: Allen  Merchandiser, retail; mother, friends  Financial Stress:  No   Income/Employment/Disability:  Educational psychologist: No   Educational History: Education: high school diploma/GED  Religion/Sprituality/World View:   none  Any cultural differences that may affect / interfere with treatment:  none  Recreation/Hobbies: weight lifting, reading, video games  Stressors: interpersonal  Strengths:  Support system  Barriers:   none  Legal History: Pending legal issue / charges: none. History of legal issue / charges: none   Subjective: Patient arrived on time for today's session.  Completed part to the assessment with patient continuing to assess relevant history, identify needs.  He stated he continues to struggle with instant gratification going on to share how he tried to set a schedule for himself daily but found himself deviating.  He stated that his schedule consistent with waking up, completing hygiene, being mindful of thoughts toward gratitude, meditation, journaling, and visualization exercises centered around what he wants for his future.  He stated that he would like to lose some weight and continue to work out, [employer].  He shared how he was obese during his childhood and adolescence and wants to maintain steps toward health.  He said his tendencies toward instant gratification interfere with his goals and at times his academic performance.  He shared how he has maintained a 3.9 GPA thus far in college, maintaining primarily A's.  He is concerned that his tendency to "cram for tests" is not sufficient particularly for his major as he is to engage in 300-400 level classes coming semesters.  After receiving his bachelor's of science degree in biology, he wants to go to medical school. He has for reasons, he struggles to state that this schedule created, he shared 2 reasons that go through his mind "I can change", and "I want to, you failed before so you probably will again".  Through guided  discovery, patient connected these thoughts to his past particularly experiences with his mother who was highly critical at times of his academic performance, often comparing him to one of his closest friends, that they were also friends with his family.  He stated this other peer excelled exceptionally academically.  Patient shared how he was able to talk with his mother about how he feels this criticism throughout his childhood affected him.  It was also discovered that patient did not emotionally express his feelings, limited the discussion recalling the behaviors while also stating that it did have an impact on him emotionally.  Encouraged patient to continue to remind with a focus on allowing himself to identify and feel the emotions connected to these past situations with his mother.  Interventions: Further assessment, supportive therapy, CBT, MI   Diagnoses:    ICD-10-CM   1. Major depressive disorder, recurrent episode, moderate ([place])  F33.1     Plan: Patient to continue journaling with a focus on areas  mentioned above.  Patient to continue to identify thoughts and behaviors that reinforce his tendencies toward instant gratification.   Long-term goal:   Reduce instead gratification of behaviors that interfere with patient achieving life goals as identified.  Increase self confidence.   Short-term goal:  Identify and remove barriers that increase tendencies toward instant gratification Identifying thoughts, feelings related to past events that persist and increased feelings of shame and guilt Identify thoughts, past events that are catalysts toward patient's self-described feelings of "self hatred" that can manifest   Assessment of progress:  progressing   An[place], [facility]

## 2021-03-20 DIAGNOSIS — J3089 Other allergic rhinitis: Secondary | ICD-10-CM | POA: Diagnosis not present

## 2021-03-20 DIAGNOSIS — J301 Allergic rhinitis due to pollen: Secondary | ICD-10-CM | POA: Diagnosis not present

## 2021-03-20 DIAGNOSIS — J3081 Allergic rhinitis due to animal (cat) (dog) hair and dander: Secondary | ICD-10-CM | POA: Diagnosis not present

## 2021-03-27 DIAGNOSIS — J3081 Allergic rhinitis due to animal (cat) (dog) hair and dander: Secondary | ICD-10-CM | POA: Diagnosis not present

## 2021-03-27 DIAGNOSIS — J3089 Other allergic rhinitis: Secondary | ICD-10-CM | POA: Diagnosis not present

## 2021-03-27 DIAGNOSIS — Z00129 Encounter for routine child health examination without abnormal findings: Secondary | ICD-10-CM | POA: Diagnosis not present

## 2021-03-27 DIAGNOSIS — J301 Allergic rhinitis due to pollen: Secondary | ICD-10-CM | POA: Diagnosis not present

## 2021-04-03 DIAGNOSIS — J3089 Other allergic rhinitis: Secondary | ICD-10-CM | POA: Diagnosis not present

## 2021-04-03 DIAGNOSIS — J301 Allergic rhinitis due to pollen: Secondary | ICD-10-CM | POA: Diagnosis not present

## 2021-04-03 DIAGNOSIS — J3081 Allergic rhinitis due to animal (cat) (dog) hair and dander: Secondary | ICD-10-CM | POA: Diagnosis not present

## 2021-04-07 ENCOUNTER — Other Ambulatory Visit: Payer: Self-pay

## 2021-04-07 ENCOUNTER — Ambulatory Visit (INDEPENDENT_AMBULATORY_CARE_PROVIDER_SITE_OTHER): Payer: 59 | Admitting: Mental Health

## 2021-04-07 DIAGNOSIS — F331 Major depressive disorder, recurrent, moderate: Secondary | ICD-10-CM

## 2021-04-07 NOTE — Progress Notes (Signed)
[facility] Counselor psychotherapy note  Name: Billy Allen Date: 04/07/2021 MRN: [id number] DOB: May 08, 2001 PCP: Sydell Axon, MD  Time spent: 54 minutes  Treatment:   Individual therapy  Mental Status Exam:    Appearance:    Casual     Behavior:   Appropriate  Motor:   WNL  Speech/Language:    Clear and Coherent  Affect:   Full range   Mood:   Euthymic  Thought process:   normal  Thought content:     WNL  Sensory/Perceptual disturbances:     none  Orientation:   x4  Attention:   Good  Concentration:   Good  Memory:   Intact  Fund of knowledge:    Consistent with age and development  Insight:     Good  Judgment:    Good  Impulse Control:   Good     Reported Symptoms:  Low motivation / procrastination, negative talk-"you should be doing this", compulsive bx's at times  Risk Assessment: Danger to Self:  No Self-injurious Behavior: No Danger to Others: No Duty to Warn:no Physical Aggression / Violence:No  Access to Firearms a concern: No  Gang Involvement:No  Patient / guardian was educated about steps to take if suicide or homicide risk level increases between visits: yes While future psychiatric events cannot be accurately predicted, the patient does not currently require acute inpatient psychiatric care and does not currently meet [facility] involuntary commitment criteria.  Legal History: Pending legal issue / charges:  none. History of legal issue / charges: none   Subjective: Patient arrived on time for today's session.  He shared recent challenges, he continues to cope with days of instant gratification.  He wants to have this managed prior to this fall semester of school.  Time was spent to continue to explore history, how he feels he is managing the relationship with his mother as he felt that she had high expectations and he.  He does feel a connection between his engaging in instant gratification and what he went through in the past of meeting his  mother's expectations academically at times.  We discussed STOPP coping skill to utilize between sessions, facilitating patient to integrate some recent experiences for further understanding and application.  Interventions: Further assessment, supportive therapy, CBT, MI   Diagnoses:    ICD-10-CM   1. Major depressive disorder, recurrent episode, moderate ([place])  F33.1        Plan: Patient to continue journaling with a focus on areas mentioned above.  Patient to continue to identify thoughts and behaviors that reinforce his tendencies toward instant gratification.   Long-term goal:   Reduce instead gratification of behaviors that interfere with patient achieving life goals as identified.  Increase self confidence.   Short-term goal:  Identify and remove barriers that increase tendencies toward instant gratification Identifying thoughts, feelings related to past events that persist and increased feelings of shame and guilt Identify thoughts, past events that are catalysts toward patient's self-described feelings of "self hatred" that can manifest   Assessment of progress:  progressing   Anson [place], [facility]

## 2021-04-10 DIAGNOSIS — J3081 Allergic rhinitis due to animal (cat) (dog) hair and dander: Secondary | ICD-10-CM | POA: Diagnosis not present

## 2021-04-10 DIAGNOSIS — J301 Allergic rhinitis due to pollen: Secondary | ICD-10-CM | POA: Diagnosis not present

## 2021-04-10 DIAGNOSIS — J3089 Other allergic rhinitis: Secondary | ICD-10-CM | POA: Diagnosis not present

## 2021-04-13 DIAGNOSIS — K5080 Crohn's disease of both small and large intestine without complications: Secondary | ICD-10-CM | POA: Diagnosis not present

## 2021-04-13 DIAGNOSIS — K5090 Crohn's disease, unspecified, without complications: Secondary | ICD-10-CM | POA: Diagnosis not present

## 2021-04-15 ENCOUNTER — Other Ambulatory Visit (HOSPITAL_COMMUNITY): Payer: Self-pay

## 2021-04-15 DIAGNOSIS — H209 Unspecified iridocyclitis: Secondary | ICD-10-CM | POA: Diagnosis not present

## 2021-04-16 DIAGNOSIS — Z8719 Personal history of other diseases of the digestive system: Secondary | ICD-10-CM | POA: Diagnosis not present

## 2021-04-16 DIAGNOSIS — K5090 Crohn's disease, unspecified, without complications: Secondary | ICD-10-CM | POA: Diagnosis not present

## 2021-04-16 DIAGNOSIS — K529 Noninfective gastroenteritis and colitis, unspecified: Secondary | ICD-10-CM | POA: Diagnosis not present

## 2021-04-21 ENCOUNTER — Other Ambulatory Visit: Payer: Self-pay

## 2021-04-21 ENCOUNTER — Ambulatory Visit (INDEPENDENT_AMBULATORY_CARE_PROVIDER_SITE_OTHER): Payer: 59 | Admitting: Mental Health

## 2021-04-21 DIAGNOSIS — F331 Major depressive disorder, recurrent, moderate: Secondary | ICD-10-CM | POA: Diagnosis not present

## 2021-04-21 NOTE — Progress Notes (Signed)
[facility] Counselor psychotherapy note  Name: Billy Allen Date: 04/21/2021 MRN: [id number] DOB: 2001/07/14 PCP: Sydell Axon, MD  Time spent: 53 minutes  Treatment:   Individual therapy  Mental Status Exam:    Appearance:    Casual     Behavior:   Appropriate  Motor:   WNL  Speech/Language:    Clear and Coherent  Affect:   Full range   Mood:   Euthymic  Thought process:   normal  Thought content:     WNL  Sensory/Perceptual disturbances:     none  Orientation:   x4  Attention:   Good  Concentration:   Good  Memory:   Intact  Fund of knowledge:    Consistent with age and development  Insight:     Good  Judgment:    Good  Impulse Control:   Good     Reported Symptoms:  Low motivation / procrastination, negative talk-"you should be doing this", compulsive bx's at times  Risk Assessment: Danger to Self:  No Self-injurious Behavior: No Danger to Others: No Duty to Warn:no Physical Aggression / Violence:No  Access to Firearms a concern: No  Gang Involvement:No  Patient / guardian was educated about steps to take if suicide or homicide risk level increases between visits: yes While future psychiatric events cannot be accurately predicted, the patient does not currently require acute inpatient psychiatric care and does not currently meet [facility] involuntary commitment criteria.  Legal History: Pending legal issue / charges:  none. History of legal issue / charges: none   Subjective: Patient arrived on time for today's session.  Patient shared how he continues to struggle with his goals that he has set for himself which are to lose some weight, continue to work out and exercise, avoid engaging in viewing of pornography, avoid watching hours of videos on line.  He stated that his mother, who has struggled with some weight issues tends to buy snacks and junk food.  He stated that he recently had a discussion with her and threw away some items she had bought.  He stated  that this was his reacting due to also having some recent health issues as he engaged in a colonoscopy recently and was told he had some inflammation occurring.  He wants to improve his diet, eat healthy which he has told his mom before in the past.  He also lives with his grandparents and he knows that he is going to have to make changes for himself regardless of the food this in the house.  He went on to share how he has struggled to maintain his workout regimen which is 6 days a week.  He identified the need to change his mind set where we discussed coping skills, specifically start behaviors.  He was able to identify specific steps such as brushing his teeth after getting out of bed and doing his 5-minute meditation as precursors to his going to work out.  He plans to follow through with focusing on those start behaviors only and thought blocking out the idea of whether he wants to work out or not as a way to   Interventions: Further assessment, supportive therapy, CBT, MI   Diagnoses:    ICD-10-CM   1. Major depressive disorder, recurrent episode, moderate ([place])  F33.1         Plan: Patient to continue journaling with a focus on areas mentioned above.  Patient to continue to identify thoughts and behaviors that reinforce his tendencies  toward instant gratification.  Patient to follow through with start behaviors as discussed in session to follow through with increasing his consistency toward goals.   Long-term goal:   Reduce instead gratification of behaviors that interfere with patient achieving life goals as identified.  Increase self confidence.   Short-term goal:  Identify and remove barriers that increase tendencies toward instant gratification Identifying thoughts, feelings related to past events that persist and increased feelings of shame and guilt Identify thoughts, past events that are catalysts toward patient's self-described feelings of "self hatred" that can manifest    Assessment of progress:  progressing   Anson [place], [facility]

## 2021-04-22 DIAGNOSIS — J3081 Allergic rhinitis due to animal (cat) (dog) hair and dander: Secondary | ICD-10-CM | POA: Diagnosis not present

## 2021-04-22 DIAGNOSIS — J3089 Other allergic rhinitis: Secondary | ICD-10-CM | POA: Diagnosis not present

## 2021-04-22 DIAGNOSIS — J301 Allergic rhinitis due to pollen: Secondary | ICD-10-CM | POA: Diagnosis not present

## 2021-04-23 ENCOUNTER — Other Ambulatory Visit (INDEPENDENT_AMBULATORY_CARE_PROVIDER_SITE_OTHER): Payer: Self-pay | Admitting: Pediatric Gastroenterology

## 2021-04-23 ENCOUNTER — Encounter (INDEPENDENT_AMBULATORY_CARE_PROVIDER_SITE_OTHER): Payer: Self-pay

## 2021-04-23 ENCOUNTER — Other Ambulatory Visit (HOSPITAL_COMMUNITY): Payer: Self-pay

## 2021-04-23 DIAGNOSIS — K5010 Crohn's disease of large intestine without complications: Secondary | ICD-10-CM

## 2021-04-23 DIAGNOSIS — K5090 Crohn's disease, unspecified, without complications: Secondary | ICD-10-CM

## 2021-04-23 DIAGNOSIS — K5080 Crohn's disease of both small and large intestine without complications: Secondary | ICD-10-CM | POA: Diagnosis not present

## 2021-04-23 MED ORDER — TRETINOIN 0.05 % EX CREA
TOPICAL_CREAM | CUTANEOUS | 2 refills | Status: DC
  Filled 2021-04-23: qty 20, 30d supply, fill #0
  Filled 2022-02-19: qty 20, 30d supply, fill #1

## 2021-04-23 MED FILL — Dapsone Gel 5%: CUTANEOUS | 30 days supply | Qty: 60 | Fill #0 | Status: CN

## 2021-04-24 ENCOUNTER — Other Ambulatory Visit (HOSPITAL_COMMUNITY): Payer: Self-pay

## 2021-04-24 ENCOUNTER — Other Ambulatory Visit (INDEPENDENT_AMBULATORY_CARE_PROVIDER_SITE_OTHER): Payer: Self-pay

## 2021-04-24 DIAGNOSIS — K5010 Crohn's disease of large intestine without complications: Secondary | ICD-10-CM

## 2021-04-24 MED FILL — Dapsone Gel 5%: CUTANEOUS | 30 days supply | Qty: 60 | Fill #0 | Status: AC

## 2021-04-25 LAB — VITAMIN D 25 HYDROXY (VIT D DEFICIENCY, FRACTURES): Vit D, 25-Hydroxy: 67 ng/mL (ref 30–100)

## 2021-04-28 ENCOUNTER — Ambulatory Visit: Payer: 59 | Admitting: Mental Health

## 2021-04-28 ENCOUNTER — Other Ambulatory Visit (HOSPITAL_COMMUNITY): Payer: Self-pay

## 2021-04-28 LAB — CBC WITH DIFFERENTIAL/PLATELET
Absolute Monocytes: 616 {cells}/uL (ref 200–900)
Basophils Absolute: 53 {cells}/uL (ref 0–200)
Basophils Relative: 0.6 %
Eosinophils Absolute: 150 {cells}/uL (ref 15–500)
Eosinophils Relative: 1.7 %
HCT: 42.9 % (ref 36.0–49.0)
Hemoglobin: 14.1 g/dL (ref 12.0–16.9)
Lymphs Abs: 1760 {cells}/uL (ref 1200–5200)
MCH: 30.5 pg (ref 25.0–35.0)
MCHC: 32.9 g/dL (ref 31.0–36.0)
MCV: 92.9 fL (ref 78.0–98.0)
MPV: 9.3 fL (ref 7.5–12.5)
Monocytes Relative: 7 %
Neutro Abs: 6222 {cells}/uL (ref 1800–8000)
Neutrophils Relative %: 70.7 %
Platelets: 407 10*3/uL — ABNORMAL HIGH (ref 140–400)
RBC: 4.62 10*6/uL (ref 4.10–5.70)
RDW: 11.9 % (ref 11.0–15.0)
Total Lymphocyte: 20 %
WBC: 8.8 10*3/uL (ref 4.5–13.0)

## 2021-04-28 LAB — COMPREHENSIVE METABOLIC PANEL
AG Ratio: 1.6 (calc) (ref 1.0–2.5)
ALT: 57 U/L — ABNORMAL HIGH (ref 8–46)
AST: 31 U/L (ref 12–32)
Albumin: 4.6 g/dL (ref 3.6–5.1)
Alkaline phosphatase (APISO): 104 U/L (ref 46–169)
BUN: 15 mg/dL (ref 7–20)
CO2: 24 mmol/L (ref 20–32)
Calcium: 9.9 mg/dL (ref 8.9–10.4)
Chloride: 104 mmol/L (ref 98–110)
Creat: 0.79 mg/dL (ref 0.60–1.26)
Globulin: 2.8 g/dL (ref 2.1–3.5)
Glucose, Bld: 82 mg/dL (ref 65–99)
Potassium: 4.2 mmol/L (ref 3.8–5.1)
Sodium: 137 mmol/L (ref 135–146)
Total Bilirubin: 0.5 mg/dL (ref 0.2–1.1)
Total Protein: 7.4 g/dL (ref 6.3–8.2)

## 2021-04-28 LAB — HEPATITIS B SURFACE ANTIGEN: Hepatitis B Surface Ag: NONREACTIVE

## 2021-04-28 LAB — GAMMA GT: GGT: 84 U/L — ABNORMAL HIGH (ref 9–31)

## 2021-04-28 LAB — VARICELLA ZOSTER ANTIBODY, IGG: Varicella IgG: <135 {index} — ABNORMAL LOW

## 2021-04-28 LAB — C-REACTIVE PROTEIN: CRP: 8.1 mg/L — ABNORMAL HIGH (ref <8.0)

## 2021-04-28 LAB — QUANTIFERON-TB GOLD PLUS
Mitogen-NIL: 2.88 [IU]/mL
NIL: 0.03 [IU]/mL
QuantiFERON-TB Gold Plus: NEGATIVE
TB1-NIL: 0 [IU]/mL
TB2-NIL: 0.01 [IU]/mL

## 2021-04-28 LAB — SEDIMENTATION RATE: Sed Rate: 6 mm/h (ref 0–15)

## 2021-04-28 LAB — HEPATITIS B CORE ANTIBODY, IGM: Hep B C IgM: NONREACTIVE

## 2021-04-28 LAB — HEPATITIS B SURFACE ANTIBODY,QUALITATIVE: Hep B S Ab: REACTIVE — AB

## 2021-05-04 ENCOUNTER — Other Ambulatory Visit (INDEPENDENT_AMBULATORY_CARE_PROVIDER_SITE_OTHER): Payer: Self-pay

## 2021-05-04 DIAGNOSIS — K5010 Crohn's disease of large intestine without complications: Secondary | ICD-10-CM

## 2021-05-04 MED ORDER — HUMIRA (2 PEN) 40 MG/0.4ML ~~LOC~~ AJKT
AUTO-INJECTOR | SUBCUTANEOUS | 1 refills | Status: DC
  Filled 2021-05-04: qty 6, fill #0

## 2021-05-04 MED ORDER — HUMIRA (2 PEN) 80 MG/0.8ML ~~LOC~~ PNKT
PEN_INJECTOR | SUBCUTANEOUS | 0 refills | Status: DC
  Filled 2021-05-04: qty 3, fill #0

## 2021-05-05 ENCOUNTER — Other Ambulatory Visit (HOSPITAL_COMMUNITY): Payer: Self-pay

## 2021-05-05 ENCOUNTER — Other Ambulatory Visit: Payer: Self-pay | Admitting: Pharmacist

## 2021-05-05 ENCOUNTER — Ambulatory Visit: Payer: 59 | Attending: Pediatrics | Admitting: Pharmacist

## 2021-05-05 DIAGNOSIS — Z7189 Other specified counseling: Secondary | ICD-10-CM

## 2021-05-05 DIAGNOSIS — K5010 Crohn's disease of large intestine without complications: Secondary | ICD-10-CM

## 2021-05-05 MED ORDER — HUMIRA (2 PEN) 40 MG/0.4ML ~~LOC~~ AJKT
AUTO-INJECTOR | SUBCUTANEOUS | 1 refills | Status: DC
  Filled 2021-05-05: qty 2, 28d supply, fill #0

## 2021-05-05 MED ORDER — HUMIRA (2 PEN) 80 MG/0.8ML ~~LOC~~ PNKT
PEN_INJECTOR | SUBCUTANEOUS | 0 refills | Status: DC
  Filled 2021-05-05: qty 3, fill #0
  Filled 2021-05-08: qty 3, 28d supply, fill #0
  Filled 2021-05-14: qty 2, 14d supply, fill #0
  Filled 2021-05-14: qty 2, 28d supply, fill #0

## 2021-05-05 NOTE — Progress Notes (Signed)
S: Patient presents for review of their specialty medication therapy.  Patient is about to start Humira for Crohn's disease. Patient is managed by Dr. Yehuda Savannah for this.   Adherence: has not started   Efficacy:has not yet started  Dosing:  Crohn disease: SubQ (may continue aminosalicylates and/or corticosteroids; if necessary, azathioprine, mercaptopurine, or methotrexate may also be continued): Initial: 160 mg (given as four 40 mg injections on day 1 or given as two 40 mg injections per day over 2 consecutive days), then 80 mg 2 weeks later (day 15). Maintenance: 40 mg every other week beginning day 29. Note: Some patients may require 40 mg every week as maintenance therapy Karl Pock [id number]).  Dose adjustments: Renal: no dose adjustments (has not been studied) Hepatic: no dose adjustments (has not been studied)  Drug-drug interactions:none identified   Screening: TB test: negative Hepatitis:  - Hep B: Heb b Ab pos; surface Ag, core ag negative  Monitoring: S/sx of infection: none CBC: WNL from 04/24/2021 S/sx of hypersensitivity: has not yet started  S/sx of malignancy: has not yet started  S/sx of heart failure: has not yet started   Other side effects: has not yet started   O: Lab Results  Component Value Date   WBC 8.8 04/24/2021   HGB 14.1 04/24/2021   HCT 42.9 04/24/2021   MCV 92.9 04/24/2021   PLT 407 (H) 04/24/2021      Chemistry      Component Value Date/Time   NA 137 04/24/2021 1133   K 4.2 04/24/2021 1133   CL 104 04/24/2021 1133   CO2 24 04/24/2021 1133   BUN 15 04/24/2021 1133   CREATININE 0.79 04/24/2021 1133      Component Value Date/Time   CALCIUM 9.9 04/24/2021 1133   ALKPHOS 150 11/24/2019 1315   AST 31 04/24/2021 1133   ALT 57 (H) 04/24/2021 1133   BILITOT 0.5 04/24/2021 1133       A/P: 1. Medication review: Patient currently prescribed Humira for Crohn's disease. Reviewed the medication with the patient, including the following:  Humira is a TNF blocking agent indicated for ankylosing spondylitis, Crohn's disease, Hidradenitis suppurativa, psoriatic arthritis, plaque psoriasis, ulcerative colitis, and uveitis. Patient educated on purpose, proper use and potential adverse effects of Humira. Possible adverse effects are increased risk of infections, headache, and injection site reactions. There is the possibility of an increased risk of malignancy but it is not well understood if this increased risk is due to there medication or the disease state. There are rare cases of pancytopenia and aplastic anemia. For SubQ injection at separate sites in the thigh or lower abdomen (avoiding areas within 2 inches of navel); rotate injection sites. May leave at room temperature for ~15 to 30 minutes prior to use; do not remove cap or cover while allowing product to reach room temperature. Do not use if solution is discolored or contains particulate matter. Do not administer to skin which is red, tender, bruised, hard, or that has scars, stretch marks, or psoriasis plaques. Needle cap of the prefilled syringe or needle cover for the adalimumab pen may contain latex. Prefilled pens and syringes are available for use by patients and the full amount of the syringe should be injected (self-administration); the vial is intended for institutional use only. Vials do not contain a preservative; discard unused portion. No recommendations for any changes at this time.  Benard Halsted, PharmD, [facility], [place] [phone]

## 2021-05-06 DIAGNOSIS — J3081 Allergic rhinitis due to animal (cat) (dog) hair and dander: Secondary | ICD-10-CM | POA: Diagnosis not present

## 2021-05-06 DIAGNOSIS — J301 Allergic rhinitis due to pollen: Secondary | ICD-10-CM | POA: Diagnosis not present

## 2021-05-06 DIAGNOSIS — J3089 Other allergic rhinitis: Secondary | ICD-10-CM | POA: Diagnosis not present

## 2021-05-08 ENCOUNTER — Other Ambulatory Visit (HOSPITAL_COMMUNITY): Payer: Self-pay

## 2021-05-12 ENCOUNTER — Ambulatory Visit (INDEPENDENT_AMBULATORY_CARE_PROVIDER_SITE_OTHER): Payer: 59 | Admitting: Mental Health

## 2021-05-12 ENCOUNTER — Other Ambulatory Visit: Payer: Self-pay

## 2021-05-12 ENCOUNTER — Encounter (INDEPENDENT_AMBULATORY_CARE_PROVIDER_SITE_OTHER): Payer: Self-pay

## 2021-05-12 ENCOUNTER — Other Ambulatory Visit (HOSPITAL_COMMUNITY): Payer: Self-pay

## 2021-05-12 DIAGNOSIS — F331 Major depressive disorder, recurrent, moderate: Secondary | ICD-10-CM

## 2021-05-12 NOTE — Progress Notes (Signed)
[facility] psychotherapy note  Name: Billy Allen Date: 05/12/2021 MRN: [id number] DOB: 2001-05-03 PCP: Sydell Axon, MD  Time spent: 54 minutes  Treatment:   Individual therapy  Mental Status Exam:    Appearance:    Casual     Behavior:   Appropriate  Motor:   WNL  Speech/Language:    Clear and Coherent  Affect:   Full range   Mood:   Euthymic  Thought process:   normal  Thought content:     WNL  Sensory/Perceptual disturbances:     none  Orientation:   x4  Attention:   Good  Concentration:   Good  Memory:   Intact  Fund of knowledge:    Consistent with age and development  Insight:     Good  Judgment:    Good  Impulse Control:   Good     Reported Symptoms:  Low motivation / procrastination, negative talk-"you should be doing this", compulsive bx's at times  Risk Assessment: Danger to Self:  No Self-injurious Behavior: No Danger to Others: No he does not Duty to Warn:no Physical Aggression / Violence:No  Access to Firearms a concern: No  Gang Involvement:No  Patient / guardian was educated about steps to take if suicide or homicide risk level increases between visits: yes While future psychiatric events cannot be accurately predicted, the patient does not currently require acute inpatient psychiatric care and does not currently meet [facility] involuntary commitment criteria.  Legal History: Pending legal issue / charges:  none. History of legal issue / charges: none   Subjective: Patient arrived on time for today's session.  He shared how he feels he has been "stalling", referring to his not making the progress that he would like with giving into tendencies to view pornography, watching excessive amounts of media etc.  He identified "I have to let go of what numbs me from reality but I cannot".  He went on to share how he does not feel confident.  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  He doubts his ability to be successful, struggled to identify specifically why he thinks he will not be able to fulfill this obligation initially, eventually identifying how he feels he might be judged and discounted by the participants which are medical practitioners.  Through guided discovery, he identified how this goes back to his low self image which was first present in childhood and adolescence, being teased by bullies or his being overweight.  Facilitated patient identifying cognitions of what he knows to be true about himself where he identified "no matter what, I keep trying"; facilitated discussion related to the concept of perseverance through adversity and encouraged him to consider between sessions.   Interventions: Further assessment, supportive therapy, CBT, MI   Diagnoses:    ICD-10-CM   1. Major depressive disorder, recurrent episode, moderate ([place])  F33.1          Plan: Patient to continue journaling with a focus on areas mentioned above.  Patient to continue to identify thoughts and behaviors that reinforce his tendencies toward instant gratification.  Patient to follow through with start behaviors as discussed in session to follow through with increasing his consistency toward goals.   Long-term goal:   Reduce instead gratification of behaviors that interfere with patient achieving life goals as identified.  Increase self confidence.  Short-term goal:  Identify and remove barriers that increase tendencies toward instant gratification Identifying thoughts, feelings related to past events that persist and increased feelings of shame and guilt Identify thoughts, past events that are catalysts toward patient's self-described feelings of "self hatred" that can manifest   Assessment of progress:  progressing   Anson [place], [facility]

## 2021-05-13 ENCOUNTER — Other Ambulatory Visit (HOSPITAL_COMMUNITY): Payer: Self-pay

## 2021-05-14 ENCOUNTER — Telehealth (INDEPENDENT_AMBULATORY_CARE_PROVIDER_SITE_OTHER): Payer: Self-pay | Admitting: Pediatric Gastroenterology

## 2021-05-14 ENCOUNTER — Other Ambulatory Visit (HOSPITAL_COMMUNITY): Payer: Self-pay

## 2021-05-14 DIAGNOSIS — K5010 Crohn's disease of large intestine without complications: Secondary | ICD-10-CM

## 2021-05-14 DIAGNOSIS — J301 Allergic rhinitis due to pollen: Secondary | ICD-10-CM | POA: Diagnosis not present

## 2021-05-14 DIAGNOSIS — J3081 Allergic rhinitis due to animal (cat) (dog) hair and dander: Secondary | ICD-10-CM | POA: Diagnosis not present

## 2021-05-14 MED ORDER — HUMIRA-CD/UC/HS STARTER 40 MG/0.8ML ~~LOC~~ PNKT
PEN_INJECTOR | SUBCUTANEOUS | 0 refills | Status: DC
  Filled 2021-05-14: qty 6, 28d supply, fill #0

## 2021-05-14 NOTE — Addendum Note (Signed)
Addended by: Gunnar Bulla on: 05/14/2021 04:37 PM   Modules accepted: Orders

## 2021-05-14 NOTE — Telephone Encounter (Signed)
Returned call to [employer]. She was busy and could not speak but I spoke to another pharmacist, and he explained that the medication that was approved is the non-citrate-free Humira. A new prescription needs to be sent in for the non-citrate free Humira. I verbalized understanding and we ended the call.

## 2021-05-14 NOTE — Telephone Encounter (Signed)
  Who's calling (name and relationship to patient) : Chasity with [facility] contact number: [phone]  Provider they see: Yehuda Savannah   Reason for call: Needs to discuss the prior auth for Humira. The Humira that was approved differs than what they have at the pharmacy. Please advise.      PRESCRIPTION REFILL ONLY  Name of prescription:  Pharmacy:

## 2021-05-15 ENCOUNTER — Other Ambulatory Visit (HOSPITAL_COMMUNITY): Payer: Self-pay

## 2021-05-15 DIAGNOSIS — J3089 Other allergic rhinitis: Secondary | ICD-10-CM | POA: Diagnosis not present

## 2021-05-15 DIAGNOSIS — J301 Allergic rhinitis due to pollen: Secondary | ICD-10-CM | POA: Diagnosis not present

## 2021-05-15 DIAGNOSIS — J3081 Allergic rhinitis due to animal (cat) (dog) hair and dander: Secondary | ICD-10-CM | POA: Diagnosis not present

## 2021-05-18 ENCOUNTER — Other Ambulatory Visit (HOSPITAL_COMMUNITY): Payer: Self-pay

## 2021-05-19 ENCOUNTER — Other Ambulatory Visit (HOSPITAL_COMMUNITY): Payer: Self-pay

## 2021-05-19 ENCOUNTER — Other Ambulatory Visit: Payer: Self-pay | Admitting: Pharmacist

## 2021-05-19 DIAGNOSIS — K5010 Crohn's disease of large intestine without complications: Secondary | ICD-10-CM

## 2021-05-19 MED ORDER — HUMIRA-CD/UC/HS STARTER 40 MG/0.8ML ~~LOC~~ PNKT
PEN_INJECTOR | SUBCUTANEOUS | 0 refills | Status: DC
  Filled 2021-05-19: qty 6, 28d supply, fill #0

## 2021-05-20 ENCOUNTER — Other Ambulatory Visit: Payer: Self-pay

## 2021-05-20 ENCOUNTER — Ambulatory Visit (INDEPENDENT_AMBULATORY_CARE_PROVIDER_SITE_OTHER): Payer: 59

## 2021-05-20 VITALS — HR 72 | Temp 96.7°F | Ht 68.31 in | Wt 212.2 lb

## 2021-05-20 DIAGNOSIS — K5090 Crohn's disease, unspecified, without complications: Secondary | ICD-10-CM

## 2021-05-20 MED ORDER — ADALIMUMAB 40 MG/0.8ML ~~LOC~~ AJKT
160.0000 mg | AUTO-INJECTOR | Freq: Once | SUBCUTANEOUS | Status: AC
  Administered 2021-05-20: 160 mg via SUBCUTANEOUS

## 2021-05-20 NOTE — Progress Notes (Signed)
Reviewed injection with patient and mom.  Reviewed cleaning, site placement, & injection of pen (check for clarity, particles, push in to skin with plum end at thumb and white end against skin, hold until window has a yellow marker, then place in sharps container.  Patient watched 1 injection, did one, mom did one and then patient did the last one.  Questions answered or sent to Dr. Yehuda Savannah.   (Should they have an Epi pen on hand, patient would like to have a food sensitivity test) Provided mom and patient with information from Humira website including that Humira has resources and assistance for injections at home.

## 2021-05-20 NOTE — Progress Notes (Signed)
Name of Medication:  Humira Pen (59m/0.8 ml)  NDC number:  [address]-24 Lot Number: [id number] Expiration Date: 04/23/2022  Who administered the injection? KMike Gip RN  Administration Site:  4 pen injections (used each quadrant)    Patient supplied: Yes  Was the patient observed for 10-15 minutes after injection was given? Yes If not, why?  Was there an adverse reaction after giving medication? No If yes, what reaction?

## 2021-05-22 DIAGNOSIS — J3089 Other allergic rhinitis: Secondary | ICD-10-CM | POA: Diagnosis not present

## 2021-05-22 DIAGNOSIS — J3081 Allergic rhinitis due to animal (cat) (dog) hair and dander: Secondary | ICD-10-CM | POA: Diagnosis not present

## 2021-05-22 DIAGNOSIS — J301 Allergic rhinitis due to pollen: Secondary | ICD-10-CM | POA: Diagnosis not present

## 2021-05-26 ENCOUNTER — Other Ambulatory Visit: Payer: Self-pay

## 2021-05-26 ENCOUNTER — Ambulatory Visit (INDEPENDENT_AMBULATORY_CARE_PROVIDER_SITE_OTHER): Payer: 59 | Admitting: Mental Health

## 2021-05-26 ENCOUNTER — Other Ambulatory Visit (HOSPITAL_COMMUNITY): Payer: Self-pay

## 2021-05-26 DIAGNOSIS — F331 Major depressive disorder, recurrent, moderate: Secondary | ICD-10-CM | POA: Diagnosis not present

## 2021-05-26 NOTE — Progress Notes (Signed)
[facility] psychotherapy note  Name: Billy Allen Date: 05/26/2021 MRN: [id number] DOB: 11-28-2000 PCP: Sydell Axon, MD  Time spent: 55 minutes  Treatment:   Individual therapy  Mental Status Exam:    Appearance:    Casual     Behavior:   Appropriate  Motor:   WNL  Speech/Language:    Clear and Coherent  Affect:   Full range   Mood:   Euthymic  Thought process:   normal  Thought content:     WNL  Sensory/Perceptual disturbances:     none  Orientation:   x4  Attention:   Good  Concentration:   Good  Memory:   Intact  Fund of knowledge:    Consistent with age and development  Insight:     Good  Judgment:    Good  Impulse Control:   Good     Reported Symptoms:  Low motivation / procrastination, negative talk-"you should be doing this", compulsive bx's at times  Risk Assessment: Danger to Self:  No Self-injurious Behavior: No Danger to Others: No he does not Duty to Warn:no Physical Aggression / Violence:No  Access to Firearms a concern: No  Gang Involvement:No  Patient / guardian was educated about steps to take if suicide or homicide risk level increases between visits: yes While future psychiatric events cannot be accurately predicted, the patient does not currently require acute inpatient psychiatric care and does not currently meet [facility] involuntary commitment criteria.  Legal History: Pending legal issue / charges:  none. History of legal issue / charges: none   Subjective: Patient arrived on time for today's session.  Patient shared how he is to start school in about 2 weeks. He went on to share how he has minimal friendships, wanting to develop his social life. He recalled having two friends from last year that he's kept in contact with over the summer, one of which he takes classes with. Through guided Discovery he identified the need to set some internal boundaries related to is not "oversharing" his personal issues are stressors as he states  that he has realized that it has pushed some friends away in the past. He went on to share an example how this occurred last year with one of his friends who was helping him with injections related to his Crohn's disease. He stated that he was upset due to her not being reliable and she had expressed wanting to be helpful initially. He said as a result of his confronting her about the situation, he stated she decreased contact w/ him. Throughout guided discovery, he identified wanting to lose some weight, eat healthier diet and work on setting interpersonal boundaries to decrease over-sharing issues.  Interventions: Further assessment, supportive therapy, CBT, MI   Diagnoses:    ICD-10-CM   1. Major depressive disorder, recurrent episode, moderate ([place])  F33.1           Plan: Patient to continue journaling with a focus on areas mentioned above.  Patient to continue to identify thoughts and behaviors that reinforce his tendencies toward instant gratification.  Patient to follow through with start behaviors as discussed in session to follow through with increasing his consistency toward goals.   Long-term goal:   Reduce instead gratification of behaviors that interfere with patient achieving life goals as identified.  Increase self confidence.   Short-term goal:  Identify and remove barriers that increase tendencies toward instant gratification Identifying thoughts, feelings related to past events that persist and increased feelings  of shame and guilt Identify thoughts, past events that are catalysts toward patient's self-described feelings of "self hatred" that can manifest   Assessment of progress:  progressing   Anson [place], [facility]

## 2021-05-28 ENCOUNTER — Ambulatory Visit (INDEPENDENT_AMBULATORY_CARE_PROVIDER_SITE_OTHER): Payer: 59 | Admitting: Adult Health

## 2021-05-28 ENCOUNTER — Other Ambulatory Visit: Payer: Self-pay

## 2021-05-28 ENCOUNTER — Encounter: Payer: Self-pay | Admitting: Adult Health

## 2021-05-28 DIAGNOSIS — F331 Major depressive disorder, recurrent, moderate: Secondary | ICD-10-CM

## 2021-05-28 DIAGNOSIS — J3081 Allergic rhinitis due to animal (cat) (dog) hair and dander: Secondary | ICD-10-CM | POA: Diagnosis not present

## 2021-05-28 DIAGNOSIS — J301 Allergic rhinitis due to pollen: Secondary | ICD-10-CM | POA: Diagnosis not present

## 2021-05-28 DIAGNOSIS — J3089 Other allergic rhinitis: Secondary | ICD-10-CM | POA: Diagnosis not present

## 2021-05-28 NOTE — Progress Notes (Signed)
USMAN MILLETT [id number] July 05, 2001 20 y.o.  Subjective:   Patient ID:  Billy Allen is a 20 y.o. (DOB [birth year]) male.  Chief Complaint: No chief complaint on file.   HPI Billy Allen presents to the office today for follow-up of MDD.   Describes mood today as "ok". Denies tearfulness. Mood symptoms - denies anxiety and irritability. Decreased depression. Denies intrusive thoughts. Stating "all those symptoms went away when I returned home for the summer". Stopped medications Wellbutrin and Lexapro for 2 months - recently restarted. Returning to school in August. Working on a strategy to help with upcoming semester - trying to avoid feeling overwhelmed. Seeing Lanetta Inch for therapy. Taking medications as prescribed.  Energy levels stable. Active, going to the gym - 3 to 4 times a week. Enjoys some usual interests and activities. Student. Living at home over the summer. Family local. Spending time with family and friends. Appetite adequate. Weight stable - 210 pounds. Sleeps well most nights. Averages 7 hours with completing semester.  Focus and concentration stable - "it's ok". Completing tasks. Managing aspects of household. Student - rising sophomore at [employer]. Denies SI or HI.  Denies AH or VH.  Previous medication trials: Denies    Flowsheet Row Admission (Discharged) from 03/10/2021 in [place] No Risk        Review of Systems:  Review of Systems  Musculoskeletal:  Negative for gait problem.  Neurological:  Negative for tremors.  Psychiatric/Behavioral:         Please refer to HPI   Medications: I have reviewed the patient's current medications.  Current Outpatient Medications  Medication Sig Dispense Refill   Adalimumab (HUMIRA PEN-CD/UC/HS STARTER) 40 MG/0.8ML PNKT Inject 4 (40 mg/0.33m) pens under the skin on day 1. After 14 days, inject 2 pens. After another 14 days, inject 1 pen. Continue 1 pen every 14 days. 6 each 0   B-D 3CC  LUER-LOK SYR 25GX5/8" 25G X 5/8" 3 ML MISC USE TO INJECT METHOTREXATE INTO THE SKIN ONCE A WEEK.     buPROPion (WELLBUTRIN XL) 150 MG 24 hr tablet TAKE 3 TABLETS BY MOUTH ONCE A DAY 90 tablet 5   clindamycin-benzoyl peroxide (BENZACLIN) gel Apply 1 application topically daily.  3   clobetasol ointment (TEMOVATE) 0.05 % APPLY TO THE AFFECTED AREA(S) TWICE DAILY 60 g 3   Dapsone 5 % topical gel APPLY 1 APPLICATION ON THE SKIN IN THE MORNING 60 g 1   dupilumab (DUPIXENT) 300 MG/2ML prefilled syringe Inject 300 mg into the skin once.     EPINEPHrine 0.3 mg/0.3 mL IJ SOAJ injection Inject into the muscle as directed.     escitalopram (LEXAPRO) 10 MG tablet TAKE 1 TABLET BY MOUTH DAILY 30 tablet 5   FOLIC ACID PO Take by mouth.     levocetirizine (XYZAL) 5 MG tablet TAKE 1 TABLET BY MOUTH EVERY EVENING 30 tablet 5   loratadine (CLARITIN REDITABS) 10 MG dissolvable tablet Take by mouth.     prednisoLONE acetate (PRED FORTE) 1 % ophthalmic suspension Instill 1 drop into left eye every hour while awake. 5 mL 0   prednisoLONE acetate (PRED FORTE) 1 % ophthalmic suspension instill 1 drop every hour while awake into left eye 5 mL 0   tacrolimus (PROTOPIC) 0.1 % ointment APPLY TO THE ECZEMA PRONE AREAS DAILY 60 g 2   tretinoin (RETIN-A) 0.05 % cream Apply topically.     tretinoin (RETIN-A) 0.05 % cream Apply on  the skin nightly; Use a few nights a week and increase to nightly as tolerated. 20 g 2   VITAMIN D PO Take by mouth.     No current facility-administered medications for this visit.    Medication Side Effects: None  Allergies: No Known Allergies  Past Medical History:  Diagnosis Date   Crohn's disease ([place])    Depression    Eczema    Uveitis     Past Medical History, Surgical history, Social history, and Family history were reviewed and updated as appropriate.   Please see review of systems for further details on the patient's review from today.   Objective:   Physical Exam:  There  were no vitals taken for this visit.  Physical Exam Constitutional:      General: He is not in acute distress. Musculoskeletal:        General: No deformity.  Neurological:     Mental Status: He is alert and oriented to person, place, and time.     Coordination: Coordination normal.  Psychiatric:        Attention and Perception: Attention and perception normal. He does not perceive auditory or visual hallucinations.        Mood and Affect: Mood normal. Mood is not anxious or depressed. Affect is not labile, blunt, angry or inappropriate.        Speech: Speech normal.        Behavior: Behavior normal.        Thought Content: Thought content normal. Thought content is not paranoid or delusional. Thought content does not include homicidal or suicidal ideation. Thought content does not include homicidal or suicidal plan.        Cognition and Memory: Cognition and memory normal.        Judgment: Judgment normal.     Comments: Insight intact    Lab Review:     Component Value Date/Time   NA 137 04/24/2021 1133   K 4.2 04/24/2021 1133   CL 104 04/24/2021 1133   CO2 24 04/24/2021 1133   GLUCOSE 82 04/24/2021 1133   BUN 15 04/24/2021 1133   CREATININE 0.79 04/24/2021 1133   CALCIUM 9.9 04/24/2021 1133   PROT 7.4 04/24/2021 1133   ALBUMIN 3.0 (L) 11/24/2019 1315   AST 31 04/24/2021 1133   ALT 57 (H) 04/24/2021 1133   ALKPHOS 150 11/24/2019 1315   BILITOT 0.5 04/24/2021 1133   GFRNONAA NOT CALCULATED 11/26/2019 0649   GFRAA NOT CALCULATED 11/26/2019 0649       Component Value Date/Time   WBC 8.8 04/24/2021 1133   RBC 4.62 04/24/2021 1133   HGB 14.1 04/24/2021 1133   HCT 42.9 04/24/2021 1133   PLT 407 (H) 04/24/2021 1133   MCV 92.9 04/24/2021 1133   MCH 30.5 04/24/2021 1133   MCHC 32.9 04/24/2021 1133   RDW 11.9 04/24/2021 1133   LYMPHSABS 1,760 04/24/2021 1133   MONOABS 0.6 11/27/2019 1549   EOSABS 150 04/24/2021 1133   BASOSABS 53 04/24/2021 1133    No results found  for: POCLITH, LITHIUM   No results found for: PHENYTOIN, PHENOBARB, VALPROATE, CBMZ   .res Assessment: Plan:    Plan:  1. Wellbutrin XL 413m daily. 2. Lexapro 170mdaily  Read and reviewed note with patient for accuracy.   RTC 3 months   Patient advised to contact office with any questions, adverse effects, or acute worsening in signs and symptoms.  Diagnoses and all orders for this visit:  Major depressive disorder,  recurrent episode, moderate ([place])    Please see After Visit Summary for patient specific instructions.  Future Appointments  Date Time Provider [place]  06/11/2021  1:00 PM Anson [place], [facility] CP-CP None  06/15/2021  2:30 PM Kandis Ban, MD PS-PEDGI PSSG  06/23/2021  3:20 PM [name], MD LBPC-GR None  06/25/2021 11:00 AM Anson [place], [facility] CP-CP None  07/09/2021  1:00 PM Anson [place], [facility] CP-CP None  07/23/2021  1:00 PM Anson [place], [facility] CP-CP None  07/29/2021  3:20 PM Khristian Seals, Berdie Ogren, NP CP-CP None  08/06/2021  1:00 PM Anson [place], [facility] CP-CP None  08/20/2021  1:00 PM Anson [place], [facility] CP-CP None    No orders of the defined types were placed in this encounter.   -------------------------------

## 2021-06-03 ENCOUNTER — Other Ambulatory Visit (HOSPITAL_COMMUNITY): Payer: Self-pay

## 2021-06-03 DIAGNOSIS — J3081 Allergic rhinitis due to animal (cat) (dog) hair and dander: Secondary | ICD-10-CM | POA: Diagnosis not present

## 2021-06-03 DIAGNOSIS — J301 Allergic rhinitis due to pollen: Secondary | ICD-10-CM | POA: Diagnosis not present

## 2021-06-03 DIAGNOSIS — J3089 Other allergic rhinitis: Secondary | ICD-10-CM | POA: Diagnosis not present

## 2021-06-05 ENCOUNTER — Other Ambulatory Visit (HOSPITAL_COMMUNITY): Payer: Self-pay

## 2021-06-09 ENCOUNTER — Other Ambulatory Visit (INDEPENDENT_AMBULATORY_CARE_PROVIDER_SITE_OTHER): Payer: Self-pay | Admitting: Pediatric Gastroenterology

## 2021-06-09 ENCOUNTER — Other Ambulatory Visit (HOSPITAL_COMMUNITY): Payer: Self-pay

## 2021-06-09 ENCOUNTER — Other Ambulatory Visit (INDEPENDENT_AMBULATORY_CARE_PROVIDER_SITE_OTHER): Payer: Self-pay

## 2021-06-09 ENCOUNTER — Other Ambulatory Visit: Payer: Self-pay | Admitting: Pharmacist

## 2021-06-09 DIAGNOSIS — K5010 Crohn's disease of large intestine without complications: Secondary | ICD-10-CM

## 2021-06-09 MED ORDER — HUMIRA PEN 40 MG/0.8ML ~~LOC~~ PNKT
PEN_INJECTOR | SUBCUTANEOUS | 1 refills | Status: DC
  Filled 2021-06-09: qty 6, fill #0
  Filled 2021-06-11: qty 2, 28d supply, fill #0
  Filled 2021-07-09: qty 2, 28d supply, fill #1
  Filled 2021-07-29: qty 2, 28d supply, fill #2
  Filled 2021-09-03: qty 2, 28d supply, fill #3
  Filled 2021-10-02: qty 2, 28d supply, fill #4
  Filled 2021-11-03 – 2021-11-12 (×2): qty 2, 28d supply, fill #5

## 2021-06-09 MED ORDER — HUMIRA PEN 40 MG/0.8ML ~~LOC~~ PNKT
PEN_INJECTOR | SUBCUTANEOUS | 1 refills | Status: DC
  Filled 2021-06-09: qty 6, fill #0

## 2021-06-11 ENCOUNTER — Ambulatory Visit (INDEPENDENT_AMBULATORY_CARE_PROVIDER_SITE_OTHER): Payer: 59 | Admitting: Mental Health

## 2021-06-11 ENCOUNTER — Other Ambulatory Visit (HOSPITAL_COMMUNITY): Payer: Self-pay

## 2021-06-11 DIAGNOSIS — F331 Major depressive disorder, recurrent, moderate: Secondary | ICD-10-CM | POA: Diagnosis not present

## 2021-06-11 NOTE — Progress Notes (Signed)
[facility] psychotherapy note  Name: Billy Allen Date: 06/11/2021 MRN: [id number] DOB: 05-Oct-2001 PCP: Sydell Axon, MD  Time spent: 54 minutes  Treatment:   Individual therapy  Virtual Visit via Telephone Note Connected with patient by telephone, with their informed consent, and verified patient privacy and that I am speaking with the correct person using two identifiers. I discussed the limitations, risks, security and privacy concerns of performing psychotherapy and management service by telephone and the availability of in person appointments. I also discussed with the patient that there may be a patient responsible charge related to this service. The patient expressed understanding and agreed to proceed. I discussed the treatment planning with the patient. The patient was provided an opportunity to ask questions and all were answered. The patient agreed with the plan and demonstrated an understanding of the instructions. The patient was advised to call  our office if  symptoms worsen or feel they are in a crisis state and need immediate contact.   Therapist Location: office Patient Location: home   Mental Status Exam:    Appearance:    Casual     Behavior:   Appropriate  Motor:   WNL  Speech/Language:    Clear and Coherent  Affect:   Full range   Mood:   Euthymic  Thought process:   normal  Thought content:     WNL  Sensory/Perceptual disturbances:     none  Orientation:   x4  Attention:   Good  Concentration:   Good  Memory:   Intact  Fund of knowledge:    Consistent with age and development  Insight:     Good  Judgment:    Good  Impulse Control:   Good     Reported Symptoms:  Low motivation / procrastination, negative talk-"you should be doing this", compulsive bx's at times  Risk Assessment: Danger to Self:  No Self-injurious Behavior: No Danger to Others: No he does not Duty to Warn:no Physical Aggression / Violence:No  Access to Firearms a concern:  No  Gang Involvement:No  Patient / guardian was educated about steps to take if suicide or homicide risk level increases between visits: yes While future psychiatric events cannot be accurately predicted, the patient does not currently require acute inpatient psychiatric care and does not currently meet [facility] involuntary commitment criteria.  Legal History: Pending legal issue / charges:  none. History of legal issue / charges: none   Subjective: Patient engaged in telehealth session via telephone. He is preparing for school as it started this week. Not of a lot of assignments yet, but he knows he needs to quit video games. He has adjusted his sleep schedule already. He struggles w/ setting some boundaries with himself to not game as often. He stated "emotionally it not going to happen" in terms of not playing video games. He identified "fear" as the emotional attachment to video games. "If I didn't have it, you'll have nothing to cope with" or "I dont want to study, why dont you just play games". He has journaled about video game addiction. He stated he would use it to escape, to "feel better". He identified the need to "cut it out" referring to video games.  Collaboratively explored ways with which he can set some internal boundaries, he plans to go possibly 1 or 2 days without gaming as he has not done this anytime in the recent past that he can recall.  Facilitated his identifying needs, processing feelings and further  clarifying thoughts associated.   Interventions: Further assessment, supportive therapy, CBT, MI   Diagnoses:    ICD-10-CM   1. Major depressive disorder, recurrent episode, moderate ([place])  F33.1            Plan: Patient to continue journaling with a focus on areas mentioned above.  Patient to continue to identify thoughts and behaviors that reinforce his tendencies toward instant gratification.  Patient to go at least 1 or 2 days without gaming to further  evaluate his ability to begin to set more interpersonal boundaries.   Long-term goal:   Reduce instead gratification of behaviors that interfere with patient achieving life goals as identified.  Increase self confidence.   Short-term goal:  Identify and remove barriers that increase tendencies toward instant gratification Identifying thoughts, feelings related to past events that persist and increased feelings of shame and guilt Identify thoughts, past events that are catalysts toward patient's self-described feelings of "self hatred" that can manifest   Assessment of progress:  progressing   Anson [place], [facility]

## 2021-06-12 ENCOUNTER — Other Ambulatory Visit (HOSPITAL_COMMUNITY): Payer: Self-pay

## 2021-06-15 ENCOUNTER — Encounter (INDEPENDENT_AMBULATORY_CARE_PROVIDER_SITE_OTHER): Payer: Self-pay | Admitting: Pediatric Gastroenterology

## 2021-06-15 ENCOUNTER — Encounter (INDEPENDENT_AMBULATORY_CARE_PROVIDER_SITE_OTHER): Payer: Self-pay

## 2021-06-15 ENCOUNTER — Telehealth (INDEPENDENT_AMBULATORY_CARE_PROVIDER_SITE_OTHER): Payer: 59 | Admitting: Pediatric Gastroenterology

## 2021-06-15 DIAGNOSIS — K5010 Crohn's disease of large intestine without complications: Secondary | ICD-10-CM | POA: Diagnosis not present

## 2021-06-15 NOTE — Progress Notes (Signed)
This is a Pediatric Specialist E-Visit follow up consult provided via [place] and their parent/guardian Billy Allen (name of consenting adult) consented to an E-Visit consult today.  Location of patient: Billy Allen is at his home (location) Location of provider: Harold Allen is at [facility] (location) Patient was referred by Billy Axon, MD   The following participants were involved in this E-Visit: patient, mother and me (list of participants and their roles)  Chief Complain/ Reason for E-Visit today: Crohn's disease Total time on call: 20 minutes, plus 15 minutes of pre- and post visit worg Follow up: 3 months   Pediatric Gastroenterology Follow Up Visit   REFERRING PROVIDER:  Sydell Axon, MD [address] [place],  [place] [id number]   ASSESSMENT:     I had the pleasure of seeing Billy Allen, 20 y.o. male (DOB: 2001/07/02) who I saw in follow up today for evaluation of Crohn's disease, s/p prednisone induction therapy (off since 1 month), and now on Humira due to methotrexate failure. He just completed his induction and will start maintenance. His Crohn's disease is back in clinical remission. He is well nourished and well adjusted. He was exposed to [place]. I reassured him that outcomes of COVID are no different than in the general population.  In addition, I treated him with Augmentin for multiple punctate liver lesions, which according to an MRI at the end of May 2021 could be microabscesses (which is a rare presentation of Crohn's disease and are sterile, or as suggested bt the radiologist, could be secondary to bacterial translocation and dissemination into the liver). On MRCP in July '21, the lesions were "decreased in number since previous study, and of those remaining, no definite peripheral rim enhancement or adjacent parenchymal edema is seen. No new or enlarging liver lesions identified." Therefore, we held off on performing liver  biopsy. His GGT has flucuated and recently it has been downtrending.      PLAN:       Humira 40 mg every 2 weeks SQ CBC, CMP, ESR. CRP, vitamin D, GGT, Humira level 1-2 days before his maintenace dose of Humira at the end of September Thank you for allowing [place] to participate in the care of your patient       HISTORY OF PRESENT ILLNESS: Billy Allen is a 20 y.o. male (DOB: 2001/06/22) who is seen in follow up for Crohn's disease associated with multiple punctate lesions in the liver. History was obtained from Billy Allen and his mother.  Overall, he is doing well, but has a cold today. Stools are 1 per day. The stools are formed consistency. There is no blood in the stool. There is no abdominal pain. There is no vomiting. There is no nausea. Energy level is good. Appetite is good. Weight is stable. He has no signs of extraintestinal manifestations of active IBD, including dysphagia, fever, arthralgia, arthritis, back pain, jaundice, pruritus, erythema nodosum, eye redness, eye pain, shortness of breath, or oral ulceration.   He is off [place] but he wants to go back on it. He takes allergy shots.  He attends [employer] and going into his sophomore year.  Initial history He recovered from an episode of severe C. difficile colitis, admitted to [facility] in January 2021.  After the episode, he was evaluated at [facility] health with CT abdomen imaging on 11/24/2019, which showed "Innumerable punctate foci of low attenuation are seen scattered throughout the liver".  His small bowel  and colon were normal.  Subsequently, he was evaluated endoscopically at [facility].  His upper digestive tract was grossly normal.  However, biopsies revealed mild gastritis with some granuloma and duodenitis.  His colon had scattered aphthous lesions.  He had mild colitis on biopsy.  His terminal ileum also had some inflammation.    He has a history of severe eczema, for which he gets Dupixent since September 2020. He has seasonal  allergies and gets weekly allergy shots.  School is stressful due to the workload and his goal of maintaining a high GPA.   PAST MEDICAL HISTORY: Past Medical History:  Diagnosis Date   Crohn's disease ([place])    Depression    Eczema    Uveitis    Immunization History  Administered Date(s) Administered   Influenza,inj,Quad PF,6+ Mos 08/02/2018   PFIZER(Purple Top)SARS-COV-2 Vaccination 01/26/2020, 02/19/2020, 06/14/2020    PAST SURGICAL HISTORY: Past Surgical History:  Procedure Laterality Date   TURBINATE REDUCTION Bilateral 03/10/2021   Procedure: BILATERAL TURBINATE REDUCTION;  Surgeon: Leta Baptist, MD;  Location: [place];  Service: ENT;  Laterality: Bilateral;    SOCIAL HISTORY: Social History   Socioeconomic History   Marital status: Single    Spouse name: Not on file   Number of children: Not on file   Years of education: Not on file   Highest education level: Not on file  Occupational History   Not on file  Tobacco Use   Smoking status: Never   Smokeless tobacco: Never  Vaping Use   Vaping Use: Never used  Substance and Sexual Activity   Alcohol use: Never   Drug use: Never   Sexual activity: Never  Other Topics Concern   Not on file  Social History Narrative   No contact with father. Lives with mom, sister, Maternal grandparents, aunt, uncle, 1 dog Graduated from [employer]. Going to go to [employer] for college.   Social Determinants of Health   Financial Resource Strain: Not on file  Food Insecurity: Not on file  Transportation Needs: Not on file  Physical Activity: Not on file  Stress: Not on file  Social Connections: Not on file    FAMILY HISTORY: family history includes Cancer in his maternal grandfather; Glaucoma in his maternal grandfather; Hyperlipidemia in his maternal grandfather; Hypertension in his maternal grandfather and maternal grandmother.    REVIEW OF SYSTEMS:  The balance of 12 systems reviewed is negative except as  noted in the HPI.   MEDICATIONS: Current Outpatient Medications  Medication Sig Dispense Refill   Adalimumab (HUMIRA PEN) 40 MG/0.8ML PNKT Inject 1 pen (40 mg/0.8 mL) into skin every 14 days. 6 each 1   B-D 3CC LUER-LOK SYR 25GX5/8" 25G X 5/8" 3 ML MISC USE TO INJECT METHOTREXATE INTO THE SKIN ONCE A WEEK.     Cholecalciferol 125 MCG (5000 UT) capsule Take by mouth.     clindamycin-benzoyl peroxide (BENZACLIN) gel Apply 1 application topically daily.  3   escitalopram (LEXAPRO) 10 MG tablet TAKE 1 TABLET BY MOUTH DAILY 30 tablet 5   tacrolimus (PROTOPIC) 0.1 % ointment APPLY TO THE ECZEMA PRONE AREAS DAILY 60 g 2   tretinoin (RETIN-A) 0.05 % cream Apply on the skin nightly; Use a few nights a week and increase to nightly as tolerated. 20 g 2   buPROPion (WELLBUTRIN XL) 150 MG 24 hr tablet TAKE 3 TABLETS BY MOUTH ONCE A DAY (Patient not taking: Reported on 06/15/2021) 90 tablet 5   Dapsone 5 %  topical gel APPLY 1 APPLICATION ON THE SKIN IN THE MORNING (Patient not taking: Reported on 06/15/2021) 60 g 1   EPINEPHrine 0.3 mg/0.3 mL IJ SOAJ injection Inject into the muscle as directed. (Patient not taking: Reported on 3/78/5885)     FOLIC ACID PO Take by mouth. (Patient not taking: Reported on 06/15/2021)     levocetirizine (XYZAL) 5 MG tablet TAKE 1 TABLET BY MOUTH EVERY EVENING (Patient not taking: Reported on 06/15/2021) 30 tablet 5   loratadine (CLARITIN REDITABS) 10 MG dissolvable tablet Take by mouth. (Patient not taking: Reported on 06/15/2021)     No current facility-administered medications for this visit.    ALLERGIES: Patient has no known allergies.  VITAL SIGNS: There were no vitals taken for this visit.  PHYSICAL EXAM: Looked well on video visit  DIAGNOSTIC STUDIES:  I have reviewed all pertinent diagnostic studies, including:  MRE 03/21/2020 1. Small cystic hepatic foci not present in 2019 question of subtle peripheral enhancement and edema in the hepatic parenchyma.  Small micro abscesses are considered perhaps related to portal dissemination in the setting of recent GI infection. Would suggest follow-up to ensure resolution assuming there has been some therapy in the interval. Again these were not seen in 2019. 2. Mild restricted diffusion and minimal hyperenhancement associated with the RIGHT colon. Appearance grossly similar to the prior study. No small bowel abnormalities are demonstrated to suggest enteritis though there is mild fecalization of the distal ileum which is nonspecific. Study mildly limited by field of view on today's examination. No signs of obstruction, fistula or abscess. Perianal region not examined.  Recent Results (from the past 2160 hour(s))  Hepatitis B Surface AntiGEN     Status: None   Collection Time: 04/24/21 12:00 AM  Result Value Ref Range   Hepatitis B Surface Ag NON-REACTIVE NON-REACTIVE  Hepatitis B Core Antibody, IgM     Status: None   Collection Time: 04/24/21 12:00 AM  Result Value Ref Range   Hep B C IgM NON-REACTIVE NON-REACTIVE  Gamma GT     Status: Abnormal   Collection Time: 04/24/21 12:00 AM  Result Value Ref Range   GGT 84 (H) 9 - 31 U/L  Hepatitis B surface antibody,qualitative     Status: Abnormal   Collection Time: 04/24/21 11:33 AM  Result Value Ref Range   Hep B S Ab REACTIVE (A) NON-REACTIVE  Varicella zoster antibody, IgG     Status: Abnormal   Collection Time: 04/24/21 11:33 AM  Result Value Ref Range   Varicella IgG <135.00 (L) index    Comment:        Index               Interpretation      ---------         ----------------------     <135.00            Negative - Antibody not detected     135.00 - 164.99    Equivocal     > or = 165.00      Positive - Antibody detected .     A positive result indicates that the patient     has antibody to VZV but does not differentiate     between an active or past infection.      The clinical diagnosis must be interpreted in      conjunction with  the clinical signs and symptoms of      the patient. This assay reliably measures  immunity     due to previous infection but may not be      sensitive enough to detect antibodies induced by     vaccination. Thus, a negative result in a vaccinated     individual does not necessarily indicate     susceptibility to VZV infection. A more sensitive     test for vaccination-induced immunity is Varicella     Zoster Virus Antibody Immunity Screen, ACIF.   QuantiFERON-TB Gold Plus     Status: None   Collection Time: 04/24/21 11:33 AM  Result Value Ref Range   QuantiFERON-TB Gold Plus NEGATIVE NEGATIVE    Comment: Negative test result. M. tuberculosis complex  infection unlikely.    NIL 0.03 IU/mL   Mitogen-NIL 2.88 IU/mL   TB1-NIL 0.00 IU/mL   TB2-NIL 0.01 IU/mL    Comment: . The Nil tube value reflects the background interferon gamma immune response of the patient's blood sample. This value has been subtracted from the patient's displayed TB and Mitogen results. . Lower than expected results with the Mitogen tube prevent false-negative Quantiferon readings by detecting a patient with a potential immune suppressive condition and/or suboptimal pre-analytical specimen handling. . The TB1 Antigen tube is coated with the M. tuberculosis-specific antigens designed to elicit responses from TB antigen primed CD4+ helper T-lymphocytes. . The TB2 Antigen tube is coated with the M. tuberculosis-specific antigens designed to elicit responses from TB antigen primed CD4+ helper and CD8+ cytotoxic T-lymphocytes. . For additional information, please refer to https://education.questdiagnostics.com/faq/FAQ204 (This link is being provided for informational/ educational purposes only.) .   Sedimentation rate     Status: None   Collection Time: 04/24/21 11:33 AM  Result Value Ref Range   Sed Rate 6 0 - 15 mm/h  C-reactive protein     Status: Abnormal   Collection Time: 04/24/21 11:33 AM   Result Value Ref Range   CRP 8.1 (H) <8.0 mg/L  Comprehensive metabolic panel     Status: Abnormal   Collection Time: 04/24/21 11:33 AM  Result Value Ref Range   Glucose, Bld 82 65 - 99 mg/dL    Comment: .            Fasting reference interval .    BUN 15 7 - 20 mg/dL   Creat 0.79 0.60 - 1.26 mg/dL   BUN/Creatinine Ratio NOT APPLICABLE 6 - 22 (calc)   Sodium 137 135 - 146 mmol/L   Potassium 4.2 3.8 - 5.1 mmol/L   Chloride 104 98 - 110 mmol/L   CO2 24 20 - 32 mmol/L   Calcium 9.9 8.9 - 10.4 mg/dL   Total Protein 7.4 6.3 - 8.2 g/dL   Albumin 4.6 3.6 - 5.1 g/dL   Globulin 2.8 2.1 - 3.5 g/dL (calc)   AG Ratio 1.6 1.0 - 2.5 (calc)   Total Bilirubin 0.5 0.2 - 1.1 mg/dL   Alkaline phosphatase (APISO) 104 46 - 169 U/L   AST 31 12 - 32 U/L   ALT 57 (H) 8 - 46 U/L  CBC with Differential/Platelet     Status: Abnormal   Collection Time: 04/24/21 11:33 AM  Result Value Ref Range   WBC 8.8 4.5 - 13.0 Thousand/uL   RBC 4.62 4.10 - 5.70 Million/uL   Hemoglobin 14.1 12.0 - 16.9 g/dL   HCT 42.9 36.0 - 49.0 %   MCV 92.9 78.0 - 98.0 fL   MCH 30.5 25.0 - 35.0 pg   MCHC 32.9 31.0 - 36.0 g/dL  RDW 11.9 11.0 - 15.0 %   Platelets 407 (H) 140 - 400 Thousand/uL   MPV 9.3 7.5 - 12.5 fL   Neutro Abs 6,222 1,800 - 8,000 cells/uL   Lymphs Abs 1,760 1,200 - 5,200 cells/uL   Absolute Monocytes 616 200 - 900 cells/uL   Eosinophils Absolute 150 15 - 500 cells/uL   Basophils Absolute 53 0 - 200 cells/uL   Neutrophils Relative % 70.7 %   Total Lymphocyte 20.0 %   Monocytes Relative 7.0 %   Eosinophils Relative 1.7 %   Basophils Relative 0.6 %  Vitamin D (25 hydroxy)     Status: None   Collection Time: 04/24/21  2:35 PM  Result Value Ref Range   Vit D, 25-Hydroxy 67 30 - 100 ng/mL    Comment: Vitamin D Status         25-OH Vitamin D: . Deficiency:                    <20 ng/mL Insufficiency:             20 - 29 ng/mL Optimal:                 > or = 30 ng/mL . For 25-OH Vitamin D testing on  patients on  D2-supplementation and patients for whom quantitation  of D2 and D3 fractions is required, the QuestAssureD(TM) 25-OH VIT D, (D2,D3), LC/MS/MS is recommended: order  code [id number] (patients >26yr). See Note 1 . Note 1 . For additional information, please refer to  http://education.[facility].com/faq/FAQ199  (This link is being provided for informational/ educational purposes only.)     June 2022  Surgical pathology exam Order: [id number] 2 mo ago  Diagnosis    A: Stomach, biopsy - Gastric mucosa with no diagnostic alterations including no Helicobacter pylori organisms seen   B: Small bowel, duodenum, biopsy - Duodenal mucosa with no diagnostic alterations including no evidence of celiac disease   C: Esophagus, biopsy - Fragments of esophageal squamous epithelium with no diagnostic alterations including no increased eosinophil infiltrate   D: Colon, right, biopsy - Chronic active colitis with mild to moderate activity, consistent with the patient's clinical history of Crohn's disease - No evidence of dysplasia   E: Colon, transverse, biopsy - Chronic active colitis with moderate activity and granulomatous formation, consistent with the patient's clinical history of Crohn's disease - No evidence of dysplasia   F: Colon, left, biopsy - Chronic colitis with focal mild activity and granulomatous formation, consistent with the patient's clinical history of Crohn's disease - No evidence of dysplasia   G: Small bowel, terminal ileum, biopsy - Small bowel mucosa with prominent lymphoid aggregates, consistent with ileal mucosa with no diagnostic alterations   This electronic signature is attestation that the pathologist personally reviewed the submitted material(s) and the final diagnosis reflects that evaluation.  Electronically signed by LLewie Chamber MD on 04/17/2021 at  1:28    01/25/20 Gross appearance of colon "The perianal and digital rectal  examinations were normal. An area of mildly congested mucosa with numerous discrete aphthae with a rim of mild erythema interspersed in areas of well-appearing colon throughout the transverse and ascending colon. No bleeding, stricturing, deep or serpiginous ulcerations." Biopsies A: Stomach, biopsy - Chronic superficial active gastritis with rare intramucosal granulomas compatible with involvement by patient's known history of inflammatory bowel disease - Reactive foveolar hyperplasia - An immunohistochemical stain for H. Pylori has been ordered and the results will be issued in  an addendum - No evidence of malignancy, dysplasia, or metaplasia  B: Esophagus, biopsy - Superficial strips of benign squamous mucosa - No evidence of dysplasia or metaplasia  C: Small bowel, duodenum, biopsy - Acute and chronic duodenitis compatible with involvement by patient's known history of inflammatory bowel disease - No evidence of malignancy or dysplasia  D: Colon, right, biopsy - Focally active chronic colitis consistent with involvement by patient's known history of inflammatory bowel disease - No evidence of malignancy, dysplasia, granulomas, or viral cytopathic effect  E: Colon, transverse, biopsy - Focally active chronic colitis consistent with involvement by patient's known history of inflammatory bowel disease - No evidence of malignancy, dysplasia, granulomas, or viral cytopathic effect  F: Colon, left, biopsy - Features of chronic mucosal injury - No evidence of active colitis in this specimen - No evidence of malignancy, dysplasia, granulomas, or viral cytopathic effect  G: Small bowel, terminal ileum, biopsy - Focally active chronic ileitis compatible with involvement by patient's known history of inflammatory bowel disease - No evidence of malignancy, dysplasia, granulomas, or viral cytopathic   Sanjith Siwek A. Yehuda Savannah, MD Chief, Division of Pediatric Gastroenterology Professor of  Pediatrics

## 2021-06-15 NOTE — Patient Instructions (Signed)
Contact information For emergencies after hours, on holidays or weekends: call [phone] and ask for the pediatric gastroenterologist on call.  For regular business hours: Pediatric GI phone number: Darlina Sicilian) [place] [phone] OR Use MyChart to send messages  A special favor Our waiting list is over 2 months. Other children are waiting to be seen in our clinic. If you cannot make your next appointment, please contact us with at least 2 days notice to cancel and reschedule. Your timely phone call will allow another child to use the clinic slot.  Thank you!

## 2021-06-16 ENCOUNTER — Ambulatory Visit: Admit: 2021-06-16 | Discharge status: Against Medical Advice | Payer: 59

## 2021-06-16 ENCOUNTER — Emergency Department
Admission: EM | Admit: 2021-06-16 | Discharge: 2021-06-16 | Disposition: A | Discharge status: Home / Self Care | Payer: 59 | Source: Home / Self Care

## 2021-06-16 ENCOUNTER — Other Ambulatory Visit: Payer: Self-pay

## 2021-06-16 ENCOUNTER — Other Ambulatory Visit (HOSPITAL_COMMUNITY): Payer: Self-pay

## 2021-06-16 VITALS — BP 134/83 | HR 101 | Temp 98.7°F | Resp 20 | Ht 69.0 in | Wt 210.0 lb

## 2021-06-16 DIAGNOSIS — J029 Acute pharyngitis, unspecified: Secondary | ICD-10-CM | POA: Diagnosis not present

## 2021-06-16 DIAGNOSIS — J3489 Other specified disorders of nose and nasal sinuses: Secondary | ICD-10-CM

## 2021-06-16 DIAGNOSIS — J0100 Acute maxillary sinusitis, unspecified: Secondary | ICD-10-CM

## 2021-06-16 DIAGNOSIS — J309 Allergic rhinitis, unspecified: Secondary | ICD-10-CM

## 2021-06-16 DIAGNOSIS — H6692 Otitis media, unspecified, left ear: Secondary | ICD-10-CM

## 2021-06-16 LAB — POC SARS CORONAVIRUS 2 AG -  ED: SARS Coronavirus 2 Ag: NEGATIVE

## 2021-06-16 MED ORDER — AMOXICILLIN-POT CLAVULANATE 875-125 MG PO TABS
1.0000 | ORAL_TABLET | Freq: Two times a day (BID) | ORAL | 0 refills | Status: AC
  Filled 2021-06-16: qty 14, 7d supply, fill #0

## 2021-06-16 MED ORDER — PREDNISONE 20 MG PO TABS
60.0000 mg | ORAL_TABLET | Freq: Every day | ORAL | 0 refills | Status: DC
  Filled 2021-06-16: qty 15, 5d supply, fill #0

## 2021-06-16 MED ORDER — FEXOFENADINE HCL 180 MG PO TABS
180.0000 mg | ORAL_TABLET | Freq: Every day | ORAL | 0 refills | Status: DC
  Filled 2021-06-16: qty 15, 15d supply, fill #0

## 2021-06-16 NOTE — Discharge Instructions (Addendum)
Advised/instructed patient to take medications as directed with food to completion.  Advised patient to take Prednisone and Allegra with first dose of Augmentin for the next 5 of 7 days.  Encouraged patient to increase daily water intake while taking these medications.

## 2021-06-16 NOTE — ED Provider Notes (Signed)
[facility]    CSN: [id number] Arrival date & time: 06/16/21  1554      History   Chief Complaint Chief Complaint  Patient presents with   Nasal Congestion   Otalgia    HPI LATHAM KINZLER is a 20 y.o. male.   HPI 20 year old male presents with nasal congestion, sore throat and right ear pain for 4 days.  Reports negative home COVID-19 test yesterday and is vaccinated for COVID-19.  Past Medical History:  Diagnosis Date   Crohn's disease ([place])    Depression    Eczema    Uveitis     Patient Active Problem List   Diagnosis Date Noted   Crohn's disease ([place]) 03/31/2020   Abdominal pain 11/24/2019   Fever 11/24/2019   Joint pain 11/24/2019    Past Surgical History:  Procedure Laterality Date   TURBINATE REDUCTION Bilateral 03/10/2021   Procedure: BILATERAL TURBINATE REDUCTION;  Surgeon: Leta Baptist, MD;  Location: [place];  Service: ENT;  Laterality: Bilateral;       Home Medications    Prior to Admission medications   Medication Sig Start Date End Date Taking? Authorizing Provider  amoxicillin-clavulanate (AUGMENTIN) 875-125 MG tablet Take 1 tablet by mouth 2 (two) times daily for 7 days. 06/16/21 06/23/21 Yes Eliezer Lofts, FNP  fexofenadine [facility] ALLERGY) 180 MG tablet Take 1 tablet (180 mg total) by mouth daily for 15 days. 06/16/21 07/01/21 Yes Eliezer Lofts, FNP  predniSONE (DELTASONE) 20 MG tablet Take 3 tablets (60 mg total) by mouth daily for 5 days 06/16/21  Yes Eliezer Lofts, FNP  Adalimumab (HUMIRA PEN) 40 MG/0.8ML PNKT Inject 1 pen (40 mg/0.8 mL) into skin every 14 days. 06/09/21   Tresa Garter, MD  B-D 3CC LUER-LOK SYR 25GX5/8" 25G X 5/8" 3 ML MISC USE TO INJECT METHOTREXATE INTO THE SKIN ONCE A WEEK. 03/11/20   [provider]  buPROPion (WELLBUTRIN XL) 150 MG 24 hr tablet TAKE 3 TABLETS BY MOUTH ONCE A DAY Patient not taking: No sig reported 02/25/21 02/25/22  Mozingo, Berdie Ogren, NP  Cholecalciferol 125 MCG  (5000 UT) capsule Take by mouth.    [provider]  clindamycin-benzoyl peroxide (BENZACLIN) gel Apply 1 application topically daily. 01/18/18   [provider]  Dapsone 5 % topical gel APPLY 1 APPLICATION ON THE SKIN IN THE MORNING Patient not taking: No sig reported 10/27/20 10/27/21  Harriett Sine, MD  EPINEPHrine 0.3 mg/0.3 mL IJ SOAJ injection Inject into the muscle as directed. Patient not taking: Reported on 06/15/2021 06/02/20   [provider]  escitalopram (LEXAPRO) 10 MG tablet TAKE 1 TABLET BY MOUTH DAILY 02/25/21 02/25/22  Mozingo, Berdie Ogren, NP  FOLIC ACID PO Take by mouth. Patient not taking: Reported on 06/15/2021    [provider]  levocetirizine (XYZAL) 5 MG tablet TAKE 1 TABLET BY MOUTH EVERY EVENING Patient not taking: No sig reported 10/02/20 10/02/21  Tiajuana Amass, MD  loratadine (CLARITIN REDITABS) 10 MG dissolvable tablet Take by mouth. Patient not taking: No sig reported    [provider]  tacrolimus (PROTOPIC) 0.1 % ointment APPLY TO THE ECZEMA PRONE AREAS DAILY 10/27/20 10/27/21  Harriett Sine, MD  tretinoin (RETIN-A) 0.05 % cream Apply on the skin nightly; Use a few nights a week and increase to nightly as tolerated. 04/23/21     amitriptyline (ELAVIL) 25 MG tablet Take 1 tablet (25 mg total) by mouth at bedtime as needed for sleep. 02/25/20 03/31/20  Yehuda Savannah,  Larose Hires, MD    Family History Family History  Problem Relation Age of Onset   Hypertension Mother    Hypertension Maternal Grandmother    Cancer Maternal Grandfather    Hypertension Maternal Grandfather    Hyperlipidemia Maternal Grandfather    Glaucoma Maternal Grandfather     Social History Social History   Tobacco Use   Smoking status: Never   Smokeless tobacco: Never  Vaping Use   Vaping Use: Never used  Substance Use Topics   Alcohol use: Never   Drug use: Never     Allergies   Patient has no known allergies.   Review of  Systems Review of Systems  HENT:  Positive for congestion, ear pain, sinus pressure and sore throat.     Physical Exam Triage Vital Signs ED Triage Vitals  Enc Vitals Group     BP 06/16/21 1614 134/83     Pulse Rate 06/16/21 1614 (!) 101     Resp 06/16/21 1614 20     Temp 06/16/21 1614 98.7 F (37.1 C)     Temp Source 06/16/21 1614 Oral     SpO2 06/16/21 1614 96 %     Weight 06/16/21 1609 210 lb (95.3 kg)     Height 06/16/21 1609 5' 9"  (1.753 m)     Head Circumference --      Peak Flow --      Pain Score 06/16/21 1608 3     Pain Loc --      Pain Edu? --      Excl. in [place]? --    No data found.  Updated Vital Signs BP 134/83   Pulse (!) 101   Temp 98.7 F (37.1 C) (Oral)   Resp 20   Ht 5' 9"  (1.753 m)   Wt 210 lb (95.3 kg)   SpO2 96%   BMI 31.01 kg/m    Physical Exam Vitals and nursing note reviewed.  Constitutional:      General: He is not in acute distress.    Appearance: Normal appearance. He is normal weight. He is not ill-appearing.  HENT:     Head: Normocephalic and atraumatic.     Right Ear: Hearing, ear canal and external ear normal. Tympanic membrane is retracted.     Left Ear: Hearing, ear canal and external ear normal. Tympanic membrane is erythematous and bulging.     Nose:     Right Turbinates: Enlarged.     Left Turbinates: Enlarged.     Right Sinus: Maxillary sinus tenderness present.     Left Sinus: Maxillary sinus tenderness present.     Comments: Turbinates are erythematous    Mouth/Throat:     Pharynx: Oropharynx is clear. Uvula midline. Posterior oropharyngeal erythema present. No uvula swelling.     Comments: Moderate-significant amount of clear drainage of posterior oropharynx noted Eyes:     Extraocular Movements: Extraocular movements intact.     Conjunctiva/sclera: Conjunctivae normal.     Pupils: Pupils are equal, round, and reactive to light.  Cardiovascular:     Rate and Rhythm: Normal rate and regular rhythm.     Pulses: Normal  pulses.     Heart sounds: Normal heart sounds.  Pulmonary:     Effort: Pulmonary effort is normal.     Breath sounds: Normal breath sounds. No wheezing, rhonchi or rales.  Musculoskeletal:        General: Normal range of motion.     Cervical back: Normal range of motion and  neck supple. Tenderness present.  Lymphadenopathy:     Cervical: Cervical adenopathy present.  Skin:    General: Skin is warm and dry.  Neurological:     General: No focal deficit present.     Mental Status: He is alert and oriented to person, place, and time. Mental status is at baseline.  Psychiatric:        Mood and Affect: Mood normal.        Behavior: Behavior normal.        Thought Content: Thought content normal.     UC Treatments / Results  Labs (all labs ordered are listed, but only abnormal results are displayed) Labs Reviewed - No data to display  EKG   Radiology No results found.  Procedures Procedures (including critical care time)  Medications Ordered in UC Medications - No data to display  Initial Impression / Assessment and Plan / UC Course  I have reviewed the triage vital signs and the nursing notes.  Pertinent labs & imaging results that were available during my care of the patient were reviewed by me and considered in my medical decision making (see chart for details).     MDM: 1.  Sore throat-rapid COVID-19 negative; 2.  Acute left otitis media-Rx'd Augmentin; 3. Subacute maxillary sinusitis-Rx'd Augmentin; 4.  Sinus pressure-Rx'd Prednisone burst; 5.  Allergic rhinitis-Rx'd Allegra. Advised/instructed patient to take medications as directed with food to completion.  Advised patient to take Prednisone and Allegra with first dose of Augmentin for the next 5 of 7 days.  Encouraged patient to increase daily water intake while taking these medications.  Discharged home, hemodynamically stable Final Clinical Impressions(s) / UC Diagnoses   Final diagnoses:  Sore throat  Acute left  otitis media  Subacute maxillary sinusitis  Sinus pressure  Allergic rhinitis, unspecified seasonality, unspecified trigger     Discharge Instructions      Advised/instructed patient to take medications as directed with food to completion.  Advised patient to take Prednisone and Allegra with first dose of Augmentin for the next 5 of 7 days.  Encouraged patient to increase daily water intake while taking these medications.     ED Prescriptions     Medication Sig Dispense Auth. Provider   amoxicillin-clavulanate (AUGMENTIN) 875-125 MG tablet Take 1 tablet by mouth 2 (two) times daily for 7 days. 14 tablet Eliezer Lofts, FNP   predniSONE (DELTASONE) 20 MG tablet Take 3 tablets (60 mg total) by mouth daily for 5 days 15 tablet Eliezer Lofts, FNP   fexofenadine [facility] ALLERGY) 180 MG tablet Take 1 tablet (180 mg total) by mouth daily for 15 days. 15 tablet Eliezer Lofts, FNP      PDMP not reviewed this encounter.   Eliezer Lofts, [place] 06/16/21 1659

## 2021-06-16 NOTE — ED Triage Notes (Signed)
Pt presents to Urgent Care with c/o nasal congestion and R ear pain, onset of sore throat 4 days ago. Reports negative home COVID test yesterday; has been vaccinated. Afebrile.

## 2021-06-23 ENCOUNTER — Ambulatory Visit: Payer: 59 | Admitting: Internal Medicine

## 2021-06-25 ENCOUNTER — Ambulatory Visit: Payer: 59 | Admitting: Mental Health

## 2021-07-01 ENCOUNTER — Other Ambulatory Visit (HOSPITAL_COMMUNITY): Payer: Self-pay

## 2021-07-03 ENCOUNTER — Other Ambulatory Visit (HOSPITAL_COMMUNITY): Payer: Self-pay

## 2021-07-09 ENCOUNTER — Other Ambulatory Visit (HOSPITAL_COMMUNITY): Payer: Self-pay

## 2021-07-09 ENCOUNTER — Ambulatory Visit: Payer: 59 | Admitting: Mental Health

## 2021-07-13 ENCOUNTER — Other Ambulatory Visit (HOSPITAL_COMMUNITY): Payer: Self-pay

## 2021-07-15 ENCOUNTER — Other Ambulatory Visit (HOSPITAL_COMMUNITY): Payer: Self-pay

## 2021-07-23 ENCOUNTER — Ambulatory Visit (INDEPENDENT_AMBULATORY_CARE_PROVIDER_SITE_OTHER): Payer: 59 | Admitting: Mental Health

## 2021-07-23 ENCOUNTER — Encounter (INDEPENDENT_AMBULATORY_CARE_PROVIDER_SITE_OTHER): Payer: Self-pay

## 2021-07-23 DIAGNOSIS — F331 Major depressive disorder, recurrent, moderate: Secondary | ICD-10-CM

## 2021-07-23 DIAGNOSIS — J3089 Other allergic rhinitis: Secondary | ICD-10-CM | POA: Diagnosis not present

## 2021-07-23 DIAGNOSIS — K5010 Crohn's disease of large intestine without complications: Secondary | ICD-10-CM | POA: Diagnosis not present

## 2021-07-23 NOTE — Progress Notes (Addendum)
[facility] psychotherapy note  Name: Billy Allen Date: 07/23/2021 MRN: [id number] DOB: Dec 25, 2000 PCP: Billy Lima, MD  Time spent: 54 minutes  Treatment:   Individual therapy  Virtual Visit via Video Connected with patient by Alma Friendly with their informed consent, and verified patient privacy and that I am speaking with the correct person using two identifiers. I discussed the limitations, risks, security and privacy concerns of performing psychotherapy and management service by Alma Friendly and the availability of in person appointments. I also discussed with the patient that there may be a patient responsible charge related to this service. The patient expressed understanding and agreed to proceed. I discussed the treatment planning with the patient. The patient was provided an opportunity to ask questions and all were answered. The patient agreed with the plan and demonstrated an understanding of the instructions. The patient was advised to call  our office if  symptoms worsen or feel they are in a crisis state and need immediate contact.   Therapist Location: office Patient Location: home   Mental Status Exam:    Appearance:    Casual     Behavior:   Appropriate  Motor:   WNL  Speech/Language:    Clear and Coherent  Affect:   Full range   Mood:   Euthymic  Thought process:   normal  Thought content:     WNL  Sensory/Perceptual disturbances:     none  Orientation:   x4  Attention:   Good  Concentration:   Good  Memory:   Intact  Fund of knowledge:    Consistent with age and development  Insight:     Good  Judgment:    Good  Impulse Control:   Good     Reported Symptoms:  Low motivation / procrastination, negative talk-"you should be doing this", compulsive bx's at times  Risk Assessment: Danger to Self:  No Self-injurious Behavior: No Danger to Others: No he does not Duty to Warn:no Physical Aggression / Violence:No  Access to Firearms a concern: No  Gang  Involvement:No  Patient / guardian was educated about steps to take if suicide or homicide risk level increases between visits: yes While future psychiatric events cannot be accurately predicted, the patient does not currently require acute inpatient psychiatric care and does not currently meet [facility] involuntary commitment criteria.  Legal History: Pending legal issue / charges:  none. History of legal issue / charges: none   Subjective: Patient engaged in telehealth session via video.  He shared recent progress, how he continues to attend school this semester, is keeping up academically but admits is higher stress this semester.  He shared his recent challenges with "instant gratification".  He stated that on weekends he tends to game more often, sometimes for several hours, has utilized pornography.  He stated he struggled after the first 2 weeks of school, as he started off well keeping a boundary.  He stated at this point he is struggling, his stress is higher and needs to be more focused on getting more schoolwork done on the weekend to keep up.  He shared his efforts to journal in session, we encouraged him to continue.  Collaboratively we explore ways he can cope and care for himself by identifying boundaries.  He plans to follow through with removing all potential triggers for these behaviors.  He was able to identify subsequent thoughts and feelings he will have keeping this boundary in place.    Interventions: Further assessment, supportive therapy,  CBT, MI   Diagnoses:    ICD-10-CM   1. Major depressive disorder, recurrent episode, moderate ([place])  F33.1             Plan: Patient to continue journaling with a focus on areas mentioned above.  Patient to continue to identify thoughts and behaviors that reinforce his tendencies toward stated that he has struggled after the first 2 weeks of the semester, returning to his excessive video game on the weekend, utilizing  patient to go at least 1 or 2 days without gaming to further evaluate his ability to begin to set more interpersonal boundaries.   Long-term goal:   Reduce instead gratification of behaviors that interfere with patient achieving life goals as identified.  Increase self confidence.   Short-term goal:  Identify and remove barriers that increase tendencies toward instant gratification Identifying thoughts, feelings related to past events that persist and increased feelings of shame and guilt Identify thoughts, past events that are catalysts toward patient's self-described feelings of "self hatred" that can manifest   Assessment of progress:  progressing   [place], [facility]

## 2021-07-24 LAB — COMPREHENSIVE METABOLIC PANEL
AG Ratio: 1.6 (calc) (ref 1.0–2.5)
ALT: 46 U/L (ref 8–46)
AST: 29 U/L (ref 12–32)
Albumin: 4.7 g/dL (ref 3.6–5.1)
Alkaline phosphatase (APISO): 53 U/L (ref 46–169)
BUN: 12 mg/dL (ref 7–20)
CO2: 28 mmol/L (ref 20–32)
Calcium: 10.1 mg/dL (ref 8.9–10.4)
Chloride: 104 mmol/L (ref 98–110)
Creat: 0.89 mg/dL (ref 0.60–1.24)
Globulin: 2.9 g/dL (ref 2.1–3.5)
Glucose, Bld: 90 mg/dL (ref 65–139)
Potassium: 4.4 mmol/L (ref 3.8–5.1)
Sodium: 139 mmol/L (ref 135–146)
Total Bilirubin: 0.6 mg/dL (ref 0.2–1.1)
Total Protein: 7.6 g/dL (ref 6.3–8.2)

## 2021-07-24 LAB — CBC WITH DIFFERENTIAL/PLATELET
Absolute Monocytes: 535 {cells}/uL (ref 200–950)
Basophils Absolute: 53 {cells}/uL (ref 0–200)
Basophils Relative: 1 %
Eosinophils Absolute: 143 {cells}/uL (ref 15–500)
Eosinophils Relative: 2.7 %
HCT: 45.5 % (ref 38.5–50.0)
Hemoglobin: 15 g/dL (ref 13.2–17.1)
Lymphs Abs: 2141 {cells}/uL (ref 850–3900)
MCH: 30.3 pg (ref 27.0–33.0)
MCHC: 33 g/dL (ref 32.0–36.0)
MCV: 91.9 fL (ref 80.0–100.0)
MPV: 9.8 fL (ref 7.5–12.5)
Monocytes Relative: 10.1 %
Neutro Abs: 2427 {cells}/uL (ref 1500–7800)
Neutrophils Relative %: 45.8 %
Platelets: 367 10*3/uL (ref 140–400)
RBC: 4.95 10*6/uL (ref 4.20–5.80)
RDW: 11.7 % (ref 11.0–15.0)
Total Lymphocyte: 40.4 %
WBC: 5.3 10*3/uL (ref 3.8–10.8)

## 2021-07-24 LAB — GAMMA GT: GGT: 45 U/L — ABNORMAL HIGH (ref 9–31)

## 2021-07-24 LAB — SEDIMENTATION RATE: Sed Rate: 2 mm/h (ref 0–15)

## 2021-07-24 LAB — C-REACTIVE PROTEIN: CRP: 0.7 mg/L (ref <8.0)

## 2021-07-24 LAB — VITAMIN D 25 HYDROXY (VIT D DEFICIENCY, FRACTURES): Vit D, 25-Hydroxy: 87 ng/mL (ref 30–100)

## 2021-07-28 ENCOUNTER — Other Ambulatory Visit (HOSPITAL_COMMUNITY): Payer: Self-pay

## 2021-07-29 ENCOUNTER — Encounter: Payer: Self-pay | Admitting: Adult Health

## 2021-07-29 ENCOUNTER — Other Ambulatory Visit (HOSPITAL_COMMUNITY): Payer: Self-pay

## 2021-07-29 ENCOUNTER — Other Ambulatory Visit: Payer: Self-pay

## 2021-07-29 ENCOUNTER — Ambulatory Visit (INDEPENDENT_AMBULATORY_CARE_PROVIDER_SITE_OTHER): Payer: 59 | Admitting: Adult Health

## 2021-07-29 DIAGNOSIS — F331 Major depressive disorder, recurrent, moderate: Secondary | ICD-10-CM

## 2021-07-29 NOTE — Progress Notes (Signed)
[name] [id number] [birth year] 20 y.o.  Subjective:   Patient ID:  Billy Allen is a 20 y.o. (DOB August 07, 2001) male.  Chief Complaint: No chief complaint on file.   HPI  Billy Allen presents to the office today for follow-up of MDD.   Describes mood today as "ok". Denies tearfulness. Mood symptoms - denies anxiety and irritability. Decreased depression. Stating "I feel more anhedonic". Denies intrusive thoughts. Stating "I'm doing alright". Stopped taking Wellbutrin and Lexapro. Has been taking vitamin supplementation which has been helpful. Not wanting to restart medications at this time. Feels more grounded. Focusing on school work. Seeing Lanetta Inch for therapy. Taking medications as prescribed.  Energy levels stable. Active, has a regular exercise routine. Enjoys some usual interests and activities. Student. Living on campus. Family local. Spending time with family and friends. Appetite adequate. Weight stable - 210 pounds. Sleeps well most nights. Averages 6 to 7 hours.  Focus and concentration stable - "here and there". Completing tasks. Managing aspects of household. Student - rising sophomore at [employer]. Denies SI or HI.  Denies AH or VH.  Previous medication trials: Denies   [place] ED from 06/16/2021 in [facility] at [facility] Admission (Discharged) from 03/10/2021 in [place] Error: Question 6 not populated No Risk        Review of Systems:  Review of Systems  Musculoskeletal:  Negative for gait problem.  Neurological:  Negative for tremors.  Psychiatric/Behavioral:         Please refer to HPI   Medications: I have reviewed the patient's current medications.  Current Outpatient Medications  Medication Sig Dispense Refill   Adalimumab (HUMIRA PEN) 40 MG/0.8ML PNKT Inject 1 pen (40 mg/0.8 mL) into skin every 14 days. 6 each 1   B-D 3CC LUER-LOK SYR 25GX5/8" 25G X 5/8" 3 ML MISC USE TO INJECT METHOTREXATE  INTO THE SKIN ONCE A WEEK.     buPROPion (WELLBUTRIN XL) 150 MG 24 hr tablet TAKE 3 TABLETS BY MOUTH ONCE A DAY (Patient not taking: No sig reported) 90 tablet 5   Cholecalciferol 125 MCG (5000 UT) capsule Take by mouth.     clindamycin-benzoyl peroxide (BENZACLIN) gel Apply 1 application topically daily.  3   Dapsone 5 % topical gel APPLY 1 APPLICATION ON THE SKIN IN THE MORNING (Patient not taking: No sig reported) 60 g 1   EPINEPHrine 0.3 mg/0.3 mL IJ SOAJ injection Inject into the muscle as directed. (Patient not taking: Reported on 06/15/2021)     escitalopram (LEXAPRO) 10 MG tablet TAKE 1 TABLET BY MOUTH DAILY 30 tablet 5   fexofenadine (ALLEGRA ALLERGY) 180 MG tablet Take 1 tablet by mouth daily as needed for 15 days. 15 tablet 0   FOLIC ACID PO Take by mouth. (Patient not taking: Reported on 06/15/2021)     levocetirizine (XYZAL) 5 MG tablet TAKE 1 TABLET BY MOUTH EVERY EVENING (Patient not taking: No sig reported) 30 tablet 5   loratadine (CLARITIN REDITABS) 10 MG dissolvable tablet Take by mouth. (Patient not taking: No sig reported)     predniSONE (DELTASONE) 20 MG tablet Take 3 tablets by mouth daily for 5 days 15 tablet 0   tacrolimus (PROTOPIC) 0.1 % ointment APPLY TO THE ECZEMA PRONE AREAS DAILY 60 g 2   tretinoin (RETIN-A) 0.05 % cream Apply on the skin nightly; Use a few nights a week and increase to nightly as tolerated. 20 g 2   No  current facility-administered medications for this visit.    Medication Side Effects: None  Allergies: No Known Allergies  Past Medical History:  Diagnosis Date   Crohn's disease ([place])    Depression    Eczema    Uveitis     Past Medical History, Surgical history, Social history, and Family history were reviewed and updated as appropriate.   Please see review of systems for further details on the patient's review from today.   Objective:   Physical Exam:  There were no vitals taken for this visit.  Physical Exam Constitutional:       General: He is not in acute distress. Musculoskeletal:        General: No deformity.  Neurological:     Mental Status: He is alert and oriented to person, place, and time.     Coordination: Coordination normal.  Psychiatric:        Attention and Perception: Attention and perception normal. He does not perceive auditory or visual hallucinations.        Mood and Affect: Mood normal. Mood is not anxious or depressed. Affect is not labile, blunt, angry or inappropriate.        Speech: Speech normal.        Behavior: Behavior normal.        Thought Content: Thought content normal. Thought content is not paranoid or delusional. Thought content does not include homicidal or suicidal ideation. Thought content does not include homicidal or suicidal plan.        Cognition and Memory: Cognition and memory normal.        Judgment: Judgment normal.     Comments: Insight intact    Lab Review:     Component Value Date/Time   NA 139 07/23/2021 1435   K 4.4 07/23/2021 1435   CL 104 07/23/2021 1435   CO2 28 07/23/2021 1435   GLUCOSE 90 07/23/2021 1435   BUN 12 07/23/2021 1435   CREATININE 0.89 07/23/2021 1435   CALCIUM 10.1 07/23/2021 1435   PROT 7.6 07/23/2021 1435   ALBUMIN 3.0 (L) 11/24/2019 1315   AST 29 07/23/2021 1435   ALT 46 07/23/2021 1435   ALKPHOS 150 11/24/2019 1315   BILITOT 0.6 07/23/2021 1435   GFRNONAA NOT CALCULATED 11/26/2019 0649   GFRAA NOT CALCULATED 11/26/2019 0649       Component Value Date/Time   WBC 5.3 07/23/2021 1435   RBC 4.95 07/23/2021 1435   HGB 15.0 07/23/2021 1435   HCT 45.5 07/23/2021 1435   PLT 367 07/23/2021 1435   MCV 91.9 07/23/2021 1435   MCH 30.3 07/23/2021 1435   MCHC 33.0 07/23/2021 1435   RDW 11.7 07/23/2021 1435   LYMPHSABS 2,141 07/23/2021 1435   MONOABS 0.6 11/27/2019 1549   EOSABS 143 07/23/2021 1435   BASOSABS 53 07/23/2021 1435    No results found for: POCLITH, LITHIUM   No results found for: PHENYTOIN, PHENOBARB, VALPROATE, CBMZ    .res Assessment: Plan:    Plan:  Taking herbal supplements and would like to continue for now.   Time spent with patient was 20 minutes. Greater than 50% of face to face time with patient was spent on counseling and coordination of care.  RTC 3 months   Diagnoses and all orders for this visit:  Major depressive disorder, recurrent episode, moderate ([place])    Please see After Visit Summary for patient specific instructions.  Future Appointments  Date Time Provider [place]  08/06/2021  1:00 PM [place], [facility]  CP-CP None  08/13/2021  1:15 PM [facility] [place] [facility] PEC-PEC PEC  08/20/2021  1:00 PM Anson [place], [facility] CP-CP None  09/24/2021  1:00 PM [name], MD [facility] None  10/07/2021  1:20 PM Syretta Kochel, Berdie Ogren, NP CP-CP None    No orders of the defined types were placed in this encounter.   -------------------------------

## 2021-07-30 DIAGNOSIS — J3089 Other allergic rhinitis: Secondary | ICD-10-CM | POA: Diagnosis not present

## 2021-08-03 ENCOUNTER — Other Ambulatory Visit (HOSPITAL_COMMUNITY): Payer: Self-pay

## 2021-08-06 ENCOUNTER — Ambulatory Visit (INDEPENDENT_AMBULATORY_CARE_PROVIDER_SITE_OTHER): Payer: 59 | Admitting: Mental Health

## 2021-08-06 DIAGNOSIS — F331 Major depressive disorder, recurrent, moderate: Secondary | ICD-10-CM | POA: Diagnosis not present

## 2021-08-06 DIAGNOSIS — J3089 Other allergic rhinitis: Secondary | ICD-10-CM | POA: Diagnosis not present

## 2021-08-06 NOTE — Progress Notes (Addendum)
[facility] psychotherapy note  Name: KARAM DUNSON Date: 08/06/2021 MRN: [id number] DOB: 2001/02/10 PCP: [name], MD  Time spent: 51 minutes  Treatment:   Individual therapy  Virtual Visit via Video Connected with patient by Alma Friendly with their informed consent, and verified patient privacy and that I am speaking with the correct person using two identifiers. I discussed the limitations, risks, security and privacy concerns of performing psychotherapy and management service by Alma Friendly and the availability of in person appointments. I also discussed with the patient that there may be a patient responsible charge related to this service. The patient expressed understanding and agreed to proceed. I discussed the treatment planning with the patient. The patient was provided an opportunity to ask questions and all were answered. The patient agreed with the plan and demonstrated an understanding of the instructions. The patient was advised to call  our office if  symptoms worsen or feel they are in a crisis state and need immediate contact.   Therapist Location: office Patient Location: home   Mental Status Exam:    Appearance:    Casual     Behavior:   Appropriate  Motor:   WNL  Speech/Language:    Clear and Coherent  Affect:   Full range   Mood:   Euthymic  Thought process:   normal  Thought content:     WNL  Sensory/Perceptual disturbances:     none  Orientation:   x4  Attention:   Good  Concentration:   Good  Memory:   Intact  Fund of knowledge:    Consistent with age and development  Insight:     Good  Judgment:    Good  Impulse Control:   Good     Reported Symptoms:  Low motivation / procrastination, negative talk-"you should be doing this", compulsive bx's at times  Risk Assessment: Danger to Self:  No Self-injurious Behavior: No Danger to Others: No he does not Duty to Warn:no Physical Aggression / Violence:No  Access to Firearms a concern: No  Gang  Involvement:No  Patient / guardian was educated about steps to take if suicide or homicide risk level increases between visits: yes While future psychiatric events cannot be accurately predicted, the patient does not currently require acute inpatient psychiatric care and does not currently meet [facility] involuntary commitment criteria.  Legal History: Pending legal issue / charges:  none. History of legal issue / charges: none   Subjective:  Patient presents for telehealth session via video.  He stated "since i'm stressed more, I should be sleeping more and eating more healthy".  He stated that he is not sleeping good this week due to increased academic demands. He struggled to follow through with going to [employer] to avoid getting distracted.  He stated this week in school has been particularly stressful, this semester in general has been one of the most difficult.  He shared how he has struggled, eating less healthy, resorting to using pornography recently.  He identified thoughts, feelings of self shame related that can occur after. Discussed difference between shame vs guilt.  He shared how he needed a close friend been supportive of 1 another as they both talk about their interpersonal struggles. He plans to work on being mindful of when he starts to engage in shameful self talk.  Also, he plans to remind himself of working around 1 specific area with which he wants to elicit change, viewing pornography primarily as opposed to focusing on his  identified myriad of issues.  Interventions: Further assessment, supportive therapy, CBT, MI   Diagnoses:    ICD-10-CM   1. Major depressive disorder, recurrent episode, moderate ([place])  F33.1              Plan: Patient to continue journaling with a focus on areas mentioned above.  Patient to continue to identify thoughts and behaviors that adversely affect his ability to abstain from pornography use, eating unhealthy or playing video  games excessively.  Long-term goal:   Reduce instead gratification of behaviors that interfere with patient achieving life goals as identified.  Increase self confidence.   Short-term goal:  Identify and remove barriers that increase tendencies toward instant gratification Identifying thoughts, feelings related to past events that persist and increased feelings of shame and guilt Identify thoughts, past events that are catalysts toward patient's self-described feelings of "self hatred" that can manifest   Assessment of progress:  progressing   Anson [place], [facility]

## 2021-08-13 ENCOUNTER — Ambulatory Visit: Payer: 59

## 2021-08-17 ENCOUNTER — Telehealth: Payer: Self-pay | Admitting: Pharmacist

## 2021-08-17 NOTE — Telephone Encounter (Signed)
Called patient to schedule an appointment for the [employer] [facility]. I was unable to reach the patient so I left a HIPAA-compliant message requesting that the patient return my call.   Luke Van Ausdall, PharmD, BCACP, CPP Clinical Pharmacist [facility] [phone]  

## 2021-08-20 ENCOUNTER — Ambulatory Visit (INDEPENDENT_AMBULATORY_CARE_PROVIDER_SITE_OTHER): Payer: 59 | Admitting: Mental Health

## 2021-08-20 DIAGNOSIS — F331 Major depressive disorder, recurrent, moderate: Secondary | ICD-10-CM

## 2021-08-20 NOTE — Progress Notes (Signed)
[facility] psychotherapy note  Name: Billy Allen Date: 08/20/2021 MRN: [id number] DOB: [birth year] PCP: [name], MD  Time spent: 51 minutes  Treatment:   Individual therapy  Virtual Visit via video Connected with patient by Alma Friendly with their informed consent, and verified patient privacy and that I am speaking with the correct person using two identifiers. I discussed the limitations, risks, security and privacy concerns of performing psychotherapy and management service by Alma Friendly and the availability of in person appointments. I also discussed with the patient that there may be a patient responsible charge related to this service. The patient expressed understanding and agreed to proceed. I discussed the treatment planning with the patient. The patient was provided an opportunity to ask questions and all were answered. The patient agreed with the plan and demonstrated an understanding of the instructions. The patient was advised to call  our office if  symptoms worsen or feel they are in a crisis state and need immediate contact.   Therapist Location: office Patient Location: home   Mental Status Exam:    Appearance:    Casual     Behavior:   Appropriate  Motor:   WNL  Speech/Language:    Clear and Coherent  Affect:   Full range   Mood:   Euthymic  Thought process:   normal  Thought content:     WNL  Sensory/Perceptual disturbances:     none  Orientation:   x4  Attention:   Good  Concentration:   Good  Memory:   Intact  Fund of knowledge:    Consistent with age and development  Insight:     Good  Judgment:    Good  Impulse Control:   Good     Reported Symptoms:  Low motivation / procrastination, negative talk-"you should be doing this", compulsive bx's at times  Risk Assessment: Danger to Self:  No Self-injurious Behavior: No Danger to Others: No he does not Duty to Warn:no Physical Aggression / Violence:No  Access to Firearms a concern: No  Gang  Involvement:No  Patient / guardian was educated about steps to take if suicide or homicide risk level increases between visits: yes While future psychiatric events cannot be accurately predicted, the patient does not currently require acute inpatient psychiatric care and does not currently meet [facility] involuntary commitment criteria.  Legal History: Pending legal issue / charges:  none. History of legal issue / charges: none   Subjective:  Patient presents for telehealth session via video.  He shared his ongoing struggles with instant gratification.  Patient stated that he has been struggling academically, currently has all B's.  He has to get some A's this semester to make sure his GPA states high enough as he wants to get into medical school.  He went on to share the challenges with his engaging in excessive use of video games which was identified in session as one of the primary barriers to his studying consistently.  He expresses having lost any motivation recently to abstain from his other forms of instant gratification, pornography and junk food consumption.  Provide support and understanding as he processed feelings of frustration.  Facilitated his identifying plans he feels may increase his motivation and consistency were he identified the need to go to [employer] more often to remove himself from the distractions at his dorm.   Interventions: Further assessment, supportive therapy, CBT, MI   Diagnoses:    ICD-10-CM   1. Major depressive disorder, recurrent episode,  moderate ([place])  F33.1          Plan: Patient to continue journaling with a focus on areas mentioned above.  Patient to continue to identify thoughts and behaviors that adversely affect his ability to abstain from pornography use, eating unhealthy or playing video games excessively.  Long-term goal:   Reduce instead gratification of behaviors that interfere with patient achieving life goals as identified.   Increase self confidence.   Short-term goal:  Identify and remove barriers that increase tendencies toward instant gratification Identifying thoughts, feelings related to past events that persist and increased feelings of shame and guilt Identify thoughts, past events that are catalysts toward patient's self-described feelings of "self hatred" that can manifest   Assessment of progress:  progressing   An[place], [facility]

## 2021-09-01 ENCOUNTER — Other Ambulatory Visit (HOSPITAL_COMMUNITY): Payer: Self-pay

## 2021-09-03 ENCOUNTER — Other Ambulatory Visit (HOSPITAL_COMMUNITY): Payer: Self-pay

## 2021-09-03 ENCOUNTER — Ambulatory Visit (INDEPENDENT_AMBULATORY_CARE_PROVIDER_SITE_OTHER): Payer: 59 | Admitting: Mental Health

## 2021-09-03 DIAGNOSIS — F331 Major depressive disorder, recurrent, moderate: Secondary | ICD-10-CM

## 2021-09-03 NOTE — Progress Notes (Signed)
[facility] psychotherapy note  Name: Billy Allen Date: 09/03/2021 MRN: [id number] DOB: [birth year] PCP: [name], MD  Time spent: 31mnutes  Treatment:   Individual therapy  Virtual Visit via video Connected with patient by LAlma Friendlywith their informed consent, and verified patient privacy and that I am speaking with the correct person using two identifiers. I discussed the limitations, risks, security and privacy concerns of performing psychotherapy and management service by LAlma Friendlyand the availability of in person appointments. I also discussed with the patient that there may be a patient responsible charge related to this service. The patient expressed understanding and agreed to proceed. I discussed the treatment planning with the patient. The patient was provided an opportunity to ask questions and all were answered. The patient agreed with the plan and demonstrated an understanding of the instructions. The patient was advised to call  our office if  symptoms worsen or feel they are in a crisis state and need immediate contact.   Therapist Location: office Patient Location: home   Mental Status Exam:    Appearance:    Casual     Behavior:   Appropriate  Motor:   WNL  Speech/Language:    Clear and Coherent  Affect:   Full range   Mood:   Euthymic  Thought process:   normal  Thought content:     WNL  Sensory/Perceptual disturbances:     none  Orientation:   x4  Attention:   Good  Concentration:   Good  Memory:   Intact  Fund of knowledge:    Consistent with age and development  Insight:     Good  Judgment:    Good  Impulse Control:   Good     Reported Symptoms:  Low motivation / procrastination, negative talk-"you should be doing this", compulsive bx's at times  Risk Assessment: Danger to Self:  No Self-injurious Behavior: No Danger to Others: No he does not Duty to Warn:no Physical Aggression / Violence:No  Access to Firearms a concern: No  Gang  Involvement:No  Patient / guardian was educated about steps to take if suicide or homicide risk level increases between visits: yes While future psychiatric events cannot be accurately predicted, the patient does not currently require acute inpatient psychiatric care and does not currently meet [facility] criteria.  Legal History: Pending legal issue / charges:  none. History of legal issue / charges: none   Subjective:  Patient presents for telehealth session via video.  Assess progress.  Patient stated that he is "burned out" this college semester.  He stated that he has about 3 weeks left school however, has been struggling in one of his classes, recently had a test and awaits his grade.  He shared other changes, he has a friend.  This involves this semester with which he was open-minded occur prior to the beginning of this semester.  He stated he is also now dating a girl in his major and shared some positive experiences.  He identified the need for focused self-care during this time increased academic demands.  Ensuring that he gets enough rest when possible as a primary focus, identified also putting aside the need to work on instant gratification as discussed in previous sessions until the end of the semester.    Interventions: Further assessment, supportive therapy, CBT, MI   Diagnoses:    ICD-10-CM   1. Major depressive disorder, recurrent episode, moderate ([facility])  F33.1  Plan: Patient to continue journaling with a focus on areas mentioned above.  Patient to continue to identify thoughts and behaviors that adversely affect his ability to abstain from pornography use, eating unhealthy or playing video games excessively.  Long-term goal:   Reduce instead gratification of behaviors that interfere with patient achieving life goals as identified.  Increase self confidence.   Short-term goal:  Identify and remove barriers that increase tendencies  toward instant gratification Identifying thoughts, feelings related to past events that persist and increased feelings of shame and guilt Identify thoughts, past events that are catalysts toward patient's self-described feelings of "self hatred" that can manifest   Assessment of progress:  progressing   [place], [facility]

## 2021-09-07 ENCOUNTER — Other Ambulatory Visit (HOSPITAL_COMMUNITY): Payer: Self-pay

## 2021-09-08 ENCOUNTER — Encounter (INDEPENDENT_AMBULATORY_CARE_PROVIDER_SITE_OTHER): Payer: Self-pay

## 2021-09-09 ENCOUNTER — Other Ambulatory Visit (INDEPENDENT_AMBULATORY_CARE_PROVIDER_SITE_OTHER): Payer: Self-pay

## 2021-09-09 ENCOUNTER — Telehealth: Payer: Self-pay | Admitting: Internal Medicine

## 2021-09-09 DIAGNOSIS — R197 Diarrhea, unspecified: Secondary | ICD-10-CM

## 2021-09-09 DIAGNOSIS — K5090 Crohn's disease, unspecified, without complications: Secondary | ICD-10-CM

## 2021-09-09 NOTE — Telephone Encounter (Signed)
Patient's mom who is a [employer] employee states that patient currently see's a pediatric GI doctor at [facility]. Records are in Epic. Mom states that patient is now 74 and is needing to transfer to another GI doctor. She is requesting that patient see Dr. Carlean Purl. She states that patient is having problems with blood in stool.

## 2021-09-09 NOTE — Telephone Encounter (Signed)
Hui (Mom) informed and she would like to set her son up with another provider here. She said she will keep the appointment with the Ped GI December 5th and book with [place] further out to switch to an adult GI Doctor. I will have one of the Patient Care Coordinators reach out to Mom to set all this up.

## 2021-09-09 NOTE — Telephone Encounter (Signed)
Please tell her that I appreciate the request but due to size of my practice will not accept the patient.  See if she would like him to see one of my partners.

## 2021-09-10 ENCOUNTER — Other Ambulatory Visit (HOSPITAL_BASED_OUTPATIENT_CLINIC_OR_DEPARTMENT_OTHER): Payer: Self-pay

## 2021-09-10 ENCOUNTER — Ambulatory Visit: Payer: 59 | Attending: Internal Medicine

## 2021-09-10 DIAGNOSIS — Z23 Encounter for immunization: Secondary | ICD-10-CM

## 2021-09-10 DIAGNOSIS — R197 Diarrhea, unspecified: Secondary | ICD-10-CM | POA: Diagnosis not present

## 2021-09-10 DIAGNOSIS — K5090 Crohn's disease, unspecified, without complications: Secondary | ICD-10-CM | POA: Diagnosis not present

## 2021-09-10 MED ORDER — INFLUENZA VAC SPLIT QUAD 0.5 ML IM SUSY
PREFILLED_SYRINGE | INTRAMUSCULAR | 0 refills | Status: DC
  Filled 2021-09-10: qty 0.5, 1d supply, fill #0

## 2021-09-10 MED ORDER — PFIZER COVID-19 VAC BIVALENT 30 MCG/0.3ML IM SUSP
INTRAMUSCULAR | 0 refills | Status: DC
  Filled 2021-09-10: qty 0.3, 1d supply, fill #0

## 2021-09-10 NOTE — Progress Notes (Signed)
   Covid-19 Vaccination Clinic  Name:  Billy Allen    MRN: [id number] DOB: [birth year]  09/10/2021  Billy Allen was observed post Covid-19 immunization for 15 minutes without incident. He was provided with Vaccine Information Sheet and instruction to access the V-Safe system.   Billy Allen was instructed to call 911 with any severe reactions post vaccine: Difficulty breathing  Swelling of face and throat  A fast heartbeat  A bad rash all over body  Dizziness and weakness   Immunizations Administered     Name Date Dose VIS Date Route   Pfizer Covid-19 Vaccine Bivalent Booster 09/10/2021  1:54 PM 0.3 mL 06/24/2021 Intramuscular   Manufacturer: [place]   Lot: [id number]   [place]: [phone]

## 2021-09-11 LAB — COMPREHENSIVE METABOLIC PANEL
AG Ratio: 1.6 (calc) (ref 1.0–2.5)
ALT: 27 U/L (ref 8–46)
AST: 17 U/L (ref 12–32)
Albumin: 4.5 g/dL (ref 3.6–5.1)
Alkaline phosphatase (APISO): 49 U/L (ref 46–169)
BUN: 13 mg/dL (ref 7–20)
CO2: 24 mmol/L (ref 20–32)
Calcium: 9.8 mg/dL (ref 8.9–10.4)
Chloride: 105 mmol/L (ref 98–110)
Creat: 0.75 mg/dL (ref 0.60–1.24)
Globulin: 2.9 g/dL (ref 2.1–3.5)
Glucose, Bld: 80 mg/dL (ref 65–139)
Potassium: 4.1 mmol/L (ref 3.8–5.1)
Sodium: 138 mmol/L (ref 135–146)
Total Bilirubin: 0.6 mg/dL (ref 0.2–1.1)
Total Protein: 7.4 g/dL (ref 6.3–8.2)

## 2021-09-11 LAB — CBC WITH DIFFERENTIAL/PLATELET
Absolute Monocytes: 545 {cells}/uL (ref 200–950)
Basophils Absolute: 40 {cells}/uL (ref 0–200)
Basophils Relative: 0.5 %
Eosinophils Absolute: 253 {cells}/uL (ref 15–500)
Eosinophils Relative: 3.2 %
HCT: 43.6 % (ref 38.5–50.0)
Hemoglobin: 14.8 g/dL (ref 13.2–17.1)
Lymphs Abs: 3160 {cells}/uL (ref 850–3900)
MCH: 31.2 pg (ref 27.0–33.0)
MCHC: 33.9 g/dL (ref 32.0–36.0)
MCV: 92 fL (ref 80.0–100.0)
MPV: 9.8 fL (ref 7.5–12.5)
Monocytes Relative: 6.9 %
Neutro Abs: 3903 {cells}/uL (ref 1500–7800)
Neutrophils Relative %: 49.4 %
Platelets: 339 10*3/uL (ref 140–400)
RBC: 4.74 10*6/uL (ref 4.20–5.80)
RDW: 11.5 % (ref 11.0–15.0)
Total Lymphocyte: 40 %
WBC: 7.9 10*3/uL (ref 3.8–10.8)

## 2021-09-11 LAB — SEDIMENTATION RATE: Sed Rate: 2 mm/h (ref 0–15)

## 2021-09-11 LAB — C-REACTIVE PROTEIN: CRP: 1.1 mg/L (ref <8.0)

## 2021-09-11 LAB — GAMMA GT: GGT: 37 U/L — ABNORMAL HIGH (ref 9–31)

## 2021-09-16 ENCOUNTER — Ambulatory Visit: Payer: 59 | Admitting: Mental Health

## 2021-09-21 DIAGNOSIS — K5090 Crohn's disease, unspecified, without complications: Secondary | ICD-10-CM | POA: Diagnosis not present

## 2021-09-21 DIAGNOSIS — R197 Diarrhea, unspecified: Secondary | ICD-10-CM | POA: Diagnosis not present

## 2021-09-24 ENCOUNTER — Encounter: Payer: Self-pay | Admitting: Internal Medicine

## 2021-09-24 ENCOUNTER — Other Ambulatory Visit: Payer: Self-pay

## 2021-09-24 ENCOUNTER — Other Ambulatory Visit (HOSPITAL_COMMUNITY): Payer: Self-pay

## 2021-09-24 ENCOUNTER — Ambulatory Visit (INDEPENDENT_AMBULATORY_CARE_PROVIDER_SITE_OTHER): Payer: 59 | Admitting: Internal Medicine

## 2021-09-24 VITALS — BP 122/80 | HR 82 | Temp 98.4°F | Ht 68.0 in | Wt 219.6 lb

## 2021-09-24 DIAGNOSIS — Z0001 Encounter for general adult medical examination with abnormal findings: Secondary | ICD-10-CM | POA: Diagnosis not present

## 2021-09-24 DIAGNOSIS — J208 Acute bronchitis due to other specified organisms: Secondary | ICD-10-CM | POA: Diagnosis not present

## 2021-09-24 DIAGNOSIS — J069 Acute upper respiratory infection, unspecified: Secondary | ICD-10-CM | POA: Diagnosis not present

## 2021-09-24 DIAGNOSIS — U071 COVID-19: Secondary | ICD-10-CM | POA: Diagnosis not present

## 2021-09-24 DIAGNOSIS — K5090 Crohn's disease, unspecified, without complications: Secondary | ICD-10-CM | POA: Diagnosis not present

## 2021-09-24 LAB — POC COVID19 BINAXNOW: SARS Coronavirus 2 Ag: POSITIVE — AB

## 2021-09-24 LAB — POCT INFLUENZA A/B
Influenza A, POC: NEGATIVE
Influenza B, POC: NEGATIVE

## 2021-09-24 MED ORDER — MOLNUPIRAVIR EUA 200MG CAPSULE
4.0000 | ORAL_CAPSULE | Freq: Two times a day (BID) | ORAL | 0 refills | Status: AC
Start: 2021-09-24 — End: 2021-09-29
  Filled 2021-09-24: qty 40, 5d supply, fill #0

## 2021-09-24 MED ORDER — HYDROCODONE BIT-HOMATROP MBR 5-1.5 MG/5ML PO SOLN
5.0000 mL | Freq: Three times a day (TID) | ORAL | 0 refills | Status: DC | PRN
Start: 2021-09-24 — End: 2021-11-30
  Filled 2021-09-24: qty 120, 8d supply, fill #0

## 2021-09-24 NOTE — Progress Notes (Deleted)
Subjective:  Patient ID: Billy Allen, male    DOB: 2000-11-08  Age: 20 y.o. MRN: [id number]  CC: New Patient (Initial Visit) (He would like to discuss his allergies. )   HPI EITAN DOUBLEDAY presents for ***  History [name] has a past medical history of Crohn's disease ([place]), Depression, Eczema, and Uveitis.   He has a past surgical history that includes Turbinate reduction (Bilateral, 03/10/2021).   His family history includes Cancer in his maternal grandfather; Glaucoma in his maternal grandfather; Hyperlipidemia in his maternal grandfather; Hypertension in his maternal grandfather, maternal grandmother, and mother.He reports that he has never smoked. He has never used smokeless tobacco. He reports that he does not drink alcohol and does not use drugs.  Outpatient Medications Prior to Visit  Medication Sig Dispense Refill   Adalimumab (HUMIRA PEN) 40 MG/0.8ML PNKT Inject 1 pen (40 mg/0.8 mL) into skin every 14 days. 6 each 1   B-D 3CC LUER-LOK SYR 25GX5/8" 25G X 5/8" 3 ML MISC USE TO INJECT METHOTREXATE INTO THE SKIN ONCE A WEEK.     buPROPion (WELLBUTRIN XL) 150 MG 24 hr tablet TAKE 3 TABLETS BY MOUTH ONCE A DAY 90 tablet 5   Cholecalciferol 125 MCG (5000 UT) capsule Take by mouth.     clindamycin-benzoyl peroxide (BENZACLIN) gel Apply 1 application topically daily.  3   COVID-19 mRNA bivalent vaccine, Pfizer, (PFIZER COVID-19 VAC BIVALENT) injection Inject into the muscle. 0.3 mL 0   Dapsone 5 % topical gel APPLY 1 APPLICATION ON THE SKIN IN THE MORNING 60 g 1   EPINEPHrine 0.3 mg/0.3 mL IJ SOAJ injection Inject into the muscle as directed.     escitalopram (LEXAPRO) 10 MG tablet TAKE 1 TABLET BY MOUTH DAILY 30 tablet 5   FOLIC ACID PO Take by mouth.     influenza vac split quadrivalent PF (FLUARIX) 0.5 ML injection Inject into the muscle. 0.5 mL 0   levocetirizine (XYZAL) 5 MG tablet TAKE 1 TABLET BY MOUTH EVERY EVENING 30 tablet 5   loratadine (CLARITIN REDITABS) 10 MG dissolvable  tablet Take by mouth.     predniSONE (DELTASONE) 20 MG tablet Take 3 tablets by mouth daily for 5 days 15 tablet 0   tacrolimus (PROTOPIC) 0.1 % ointment APPLY TO THE ECZEMA PRONE AREAS DAILY 60 g 2   tretinoin (RETIN-A) 0.05 % cream Apply on the skin nightly; Use a few nights a week and increase to nightly as tolerated. 20 g 2   fexofenadine (ALLEGRA ALLERGY) 180 MG tablet Take 1 tablet by mouth daily as needed for 15 days. 15 tablet 0   No facility-administered medications prior to visit.    ROS Review of Systems  Objective:  BP 122/80   Pulse 82   Temp 98.4 F (36.9 C) (Oral)   Ht 5' 8"  (1.727 m)   Wt 219 lb 9.6 oz (99.6 kg)   SpO2 97%   BMI 33.39 kg/m   Physical Exam    Assessment & Plan:   [name] was seen today for new patient (initial visit).  Diagnoses and all orders for this visit:  Viral upper respiratory tract infection  Acute bronchitis due to COVID-19 virus   I am having Estella Husk maintain his clindamycin-benzoyl peroxide, loratadine, B-D 3CC LUER-LOK SYR 25GX5/8", EPINEPHrine, FOLIC ACID PO, Dapsone, tacrolimus, levocetirizine, buPROPion, escitalopram, tretinoin, Humira Pen, Cholecalciferol, predniSONE, fexofenadine, influenza vac split quadrivalent PF, and Pfizer COVID-19 Vac Bivalent.  No orders of the defined types were placed  in this encounter.    Follow-up: No follow-ups on file.  Scarlette Calico, MD

## 2021-09-24 NOTE — Progress Notes (Signed)
Subjective:  Patient ID: Billy Allen, male    DOB: 07-04-2001  Age: 20 y.o. MRN: [id number]  CC: Annual Exam and URI  This visit occurred during the SARS-CoV-2 public health emergency.  Safety protocols were in place, including screening questions prior to the visit, additional usage of staff PPE, and extensive cleaning of exam room while observing appropriate contact time as indicated for disinfecting solutions.    HPI MAKO PELFREY presents for a CPX and to establish.  He complains of a 2-day history of fatigue, sore throat, headache, nasal congestion, sneezing, nonproductive cough.  He has a history of Crohn's disease.  He feels like his symptoms are relatively well controlled with Humira.  He had a brief episode of bloody stool 2 weeks ago after straining to have a bowel movement.  He denies abdominal pain, loss of appetite, or weight loss.  He needs a referral to a new GI doctor.  History [name] has a past medical history of Crohn's disease ([place]), Depression, Eczema, and Uveitis.   He has a past surgical history that includes Turbinate reduction (Bilateral, 03/10/2021).   His family history includes Cancer in his maternal grandfather; Glaucoma in his maternal grandfather; Hyperlipidemia in his maternal grandfather; Hypertension in his father, maternal grandfather, maternal grandmother, and mother.He reports that he has never smoked. He has never used smokeless tobacco. He reports that he does not drink alcohol and does not use drugs.  Outpatient Medications Prior to Visit  Medication Sig Dispense Refill   Adalimumab (HUMIRA PEN) 40 MG/0.8ML PNKT Inject 1 pen (40 mg/0.8 mL) into skin every 14 days. 6 each 1   B-D 3CC LUER-LOK SYR 25GX5/8" 25G X 5/8" 3 ML MISC USE TO INJECT METHOTREXATE INTO THE SKIN ONCE A WEEK.     buPROPion (WELLBUTRIN XL) 150 MG 24 hr tablet TAKE 3 TABLETS BY MOUTH ONCE A DAY 90 tablet 5   Cholecalciferol 125 MCG (5000 UT) capsule Take by mouth.      clindamycin-benzoyl peroxide (BENZACLIN) gel Apply 1 application topically daily.  3   Dapsone 5 % topical gel APPLY 1 APPLICATION ON THE SKIN IN THE MORNING 60 g 1   EPINEPHrine 0.3 mg/0.3 mL IJ SOAJ injection Inject into the muscle as directed.     escitalopram (LEXAPRO) 10 MG tablet TAKE 1 TABLET BY MOUTH DAILY 30 tablet 5   FOLIC ACID PO Take by mouth.     levocetirizine (XYZAL) 5 MG tablet TAKE 1 TABLET BY MOUTH EVERY EVENING 30 tablet 5   loratadine (CLARITIN REDITABS) 10 MG dissolvable tablet Take by mouth.     predniSONE (DELTASONE) 20 MG tablet Take 3 tablets by mouth daily for 5 days 15 tablet 0   tacrolimus (PROTOPIC) 0.1 % ointment APPLY TO THE ECZEMA PRONE AREAS DAILY 60 g 2   tretinoin (RETIN-A) 0.05 % cream Apply on the skin nightly; Use a few nights a week and increase to nightly as tolerated. 20 g 2   COVID-19 mRNA bivalent vaccine, Pfizer, (PFIZER COVID-19 VAC BIVALENT) injection Inject into the muscle. 0.3 mL 0   influenza vac split quadrivalent PF (FLUARIX) 0.5 ML injection Inject into the muscle. 0.5 mL 0   fexofenadine (ALLEGRA ALLERGY) 180 MG tablet Take 1 tablet by mouth daily as needed for 15 days. 15 tablet 0   No facility-administered medications prior to visit.    ROS Review of Systems  Constitutional:  Positive for chills and fatigue.  HENT:  Positive for congestion, rhinorrhea and sore  throat. Negative for sinus pressure, sinus pain and voice change.   Respiratory:  Positive for cough. Negative for wheezing.   Cardiovascular:  Negative for chest pain, palpitations and leg swelling.  Gastrointestinal:  Negative for abdominal pain, blood in stool, constipation, diarrhea, nausea and vomiting.  Genitourinary: Negative.  Negative for difficulty urinating, penile swelling and scrotal swelling.  Musculoskeletal:  Negative for myalgias.  Skin: Negative.  Negative for color change and pallor.  Neurological:  Negative for dizziness and weakness.  Hematological:   Negative for adenopathy. Does not bruise/bleed easily.  Psychiatric/Behavioral: Negative.     Objective:  BP 122/80   Pulse 82   Temp 98.4 F (36.9 C) (Oral)   Ht 5' 8"  (1.727 m)   Wt 219 lb 9.6 oz (99.6 kg)   SpO2 97%   BMI 33.39 kg/m   Physical Exam Vitals reviewed.  Constitutional:      General: He is not in acute distress.    Appearance: He is not ill-appearing, toxic-appearing or diaphoretic.  HENT:     Right Ear: Tympanic membrane normal.     Left Ear: Tympanic membrane normal.     Nose: Congestion and rhinorrhea present. Rhinorrhea is clear.     Right Nostril: No epistaxis.     Left Nostril: No epistaxis.     Right Turbinates: Swollen.     Left Turbinates: Swollen.     Right Sinus: No maxillary sinus tenderness.     Left Sinus: No maxillary sinus tenderness.     Mouth/Throat:     Mouth: Mucous membranes are moist.     Pharynx: Posterior oropharyngeal erythema present. No pharyngeal swelling or oropharyngeal exudate.     Tonsils: No tonsillar exudate.  Eyes:     Conjunctiva/sclera: Conjunctivae normal.  Cardiovascular:     Rate and Rhythm: Normal rate and regular rhythm.     Heart sounds: No murmur heard. Pulmonary:     Effort: Pulmonary effort is normal.     Breath sounds: No stridor. No wheezing, rhonchi or rales.  Abdominal:     General: Abdomen is flat.     Palpations: There is no mass.     Tenderness: There is no abdominal tenderness. There is no guarding.     Hernia: No hernia is present.  Musculoskeletal:        General: Normal range of motion.     Cervical back: Neck supple.     Right lower leg: No edema.     Left lower leg: No edema.  Lymphadenopathy:     Cervical: No cervical adenopathy.  Skin:    General: Skin is warm and dry.     Findings: No rash.  Neurological:     General: No focal deficit present.     Mental Status: He is alert and oriented to person, place, and time. Mental status is at baseline.  Psychiatric:        Mood and Affect:  Mood normal.        Behavior: Behavior normal.    Lab Results  Component Value Date   WBC 7.9 09/10/2021   HGB 14.8 09/10/2021   HCT 43.6 09/10/2021   PLT 339 09/10/2021   GLUCOSE 80 09/10/2021   ALT 27 09/10/2021   AST 17 09/10/2021   NA 138 09/10/2021   K 4.1 09/10/2021   CL 105 09/10/2021   CREATININE 0.75 09/10/2021   BUN 13 09/10/2021   CO2 24 09/10/2021   INR 1.2 11/24/2019     Assessment &  Plan:   [name] was seen today for annual exam and uri.  Diagnoses and all orders for this visit:  Viral upper respiratory tract infection- Testing is negative for influenza A/B but positive for COVID-19.  Will treat with an antiviral and will offer symptom relief. -     POC COVID-19 -     POCT Influenza A/B -     HYDROcodone bit-homatropine (HYCODAN) 5-1.5 MG/5ML syrup; Take 5 mLs by mouth every 8 (eight) hours as needed for cough.  Acute bronchitis due to COVID-19 virus -     POC COVID-19 -     POCT Influenza A/B -     molnupiravir EUA (LAGEVRIO) 200 mg CAPS capsule; Take 4 capsules (800 mg total) by mouth 2 (two) times daily for 5 days. -     HYDROcodone bit-homatropine (HYCODAN) 5-1.5 MG/5ML syrup; Take 5 mLs by mouth every 8 (eight) hours as needed for cough.  Encounter for general adult medical examination with abnormal findings- Exam completed, labs reviewed, vaccines reviewed, no cancer screenings indicated, patient education was given.  Crohn's disease without complication, unspecified gastrointestinal tract location [facility]) -     Ambulatory referral to Gastroenterology  I have discontinued Corena Pilgrim. Lynam's influenza vac split quadrivalent PF and Pfizer COVID-19 Vac Bivalent. I am also having him start on molnupiravir EUA and HYDROcodone bit-homatropine. Additionally, I am having him maintain his clindamycin-benzoyl peroxide, loratadine, B-D 3CC LUER-LOK SYR 25GX5/8", EPINEPHrine, FOLIC ACID PO, Dapsone, tacrolimus, levocetirizine, buPROPion, escitalopram, tretinoin, Humira  Pen, Cholecalciferol, predniSONE, and fexofenadine.  Meds ordered this encounter  Medications   molnupiravir EUA (LAGEVRIO) 200 mg CAPS capsule    Sig: Take 4 capsules (800 mg total) by mouth 2 (two) times daily for 5 days.    Dispense:  40 capsule    Refill:  0   HYDROcodone bit-homatropine (HYCODAN) 5-1.5 MG/5ML syrup    Sig: Take 5 mLs by mouth every 8 (eight) hours as needed for cough.    Dispense:  120 mL    Refill:  0     Follow-up: Return in about 3 months (around 12/23/2021).  Scarlette Calico, MD

## 2021-09-24 NOTE — Patient Instructions (Signed)

## 2021-09-28 ENCOUNTER — Other Ambulatory Visit: Payer: Self-pay

## 2021-09-28 ENCOUNTER — Telehealth (INDEPENDENT_AMBULATORY_CARE_PROVIDER_SITE_OTHER): Payer: 59 | Admitting: Pediatric Gastroenterology

## 2021-09-28 ENCOUNTER — Encounter (INDEPENDENT_AMBULATORY_CARE_PROVIDER_SITE_OTHER): Payer: Self-pay | Admitting: Pediatric Gastroenterology

## 2021-09-28 VITALS — Wt 219.0 lb

## 2021-09-28 DIAGNOSIS — R748 Abnormal levels of other serum enzymes: Secondary | ICD-10-CM

## 2021-09-28 DIAGNOSIS — K5010 Crohn's disease of large intestine without complications: Secondary | ICD-10-CM

## 2021-09-28 LAB — CALPROTECTIN: Calprotectin: 73 ug/g

## 2021-09-28 NOTE — Patient Instructions (Signed)
Contact information For emergencies after hours, on holidays or weekends: call [phone] and ask for the pediatric gastroenterologist on call.  For regular business hours: Pediatric GI phone number: Darlina Sicilian) [place] [phone] OR Use MyChart to send messages  A special favor Our waiting list is over 2 months. Other children are waiting to be seen in our clinic. If you cannot make your next appointment, please contact us with at least 2 days notice to cancel and reschedule. Your timely phone call will allow another child to use the clinic slot.  Thank you!

## 2021-09-28 NOTE — Progress Notes (Signed)
This is a Pediatric Specialist E-Visit follow up consult provided via [place] (name of consenting adult) consented to an E-Visit consult today.  Location of patient: [facility] is at his home (location) Location of provider: [facility] is at [facility] (location) Patient was referred by Billy Lima, MD   The following participants were involved in this E-Visit: patient, mother, ane me (list of participants and their roles)  Chief Complain/ Reason for E-Visit today: Crohn's disease Total time on call: 30 min Follow up: 4 months   Pediatric Gastroenterology Follow Up Visit   REFERRING PROVIDER:  [name], MD [place],  [place] [id number]   ASSESSMENT:     I had the pleasure of seeing [facility], 20 y.o. male (DOB: 2001-07-06) who I saw in follow up today for evaluation of Crohn's disease, s/p prednisone induction therapy (off since 1 month), and now on Humira due to methotrexate failure. His Crohn's disease is in clinical remission. He is well nourished and well adjusted.   He was diagnosed with COVID on 09/24/21 - he is fully immunized for COVID. He will push back his Humira dose by 1 week.  I asked him to stop vitamin D due to high level in September 2022.  In addition, I treated him with Augmentin for multiple punctate liver lesions, which according to an MRI at the end of May 2021 could be microabscesses (which is a rare presentation of Crohn's disease and are sterile, or as suggested by the radiologist, could be secondary to bacterial translocation and dissemination into the liver). On MRCP in July '21, the lesions were "decreased in number since previous study, and of those remaining, no definite peripheral rim enhancement or adjacent parenchymal edema is seen. No new or enlarging liver lesions identified." Therefore, we held off on performing liver biopsy. His GGT has been downtrending and aminotransferases are normal,  suggesting healing of the liver.      PLAN:       Humira 40 mg every 2 weeks SQ CBC, CMP, ESR. CRP, vitamin D, GGT, Quantiferon Gold with next blood work (May 2023) Thank you for allowing [place] to participate in the care of your patient       HISTORY OF PRESENT ILLNESS: [facility] is a 20 y.o. male (DOB: [birth year]) who is seen in follow up for Crohn's disease associated with multiple punctate lesions in the liver. History was obtained from [place] and his mother.  Overall, he is doing well. Stools are 1 -2 per day. The stools are formed consistency. There is no blood in the stool except once in the toilet paper (red blood). There is no abdominal pain. There is no vomiting. There is no nausea. He feels tired - he thinks it is due to school. He gets 6-7 hours of sleep at night. Appetite is good. Weight is stable. He has no signs of extraintestinal manifestations of active IBD, including dysphagia, fever, arthralgia, arthritis, back pain, jaundice, erythema nodosum, eye redness, eye pain, shortness of breath, or oral ulceration. He has eczema.  He is off [facility] [place] (took it for eczema). He takes allergy shots.  He attends [employer] and is in his junior year.  Initial history He recovered from an episode of severe C. difficile colitis, admitted to [facility] in January 2021.  After the episode, he was evaluated at [facility] with CT abdomen imaging on 11/24/2019, which showed "Innumerable punctate foci of low attenuation are  seen scattered throughout the liver".  His small bowel and colon were normal.  Subsequently, he was evaluated endoscopically at [facility].  His upper digestive tract was grossly normal.  However, biopsies revealed mild gastritis with some granuloma and duodenitis.  His colon had scattered aphthous lesions.  He had mild colitis on biopsy.  His terminal ileum also had some inflammation.    He has a history of severe eczema, for which he gets [place] since September 2020. He has  seasonal allergies and gets weekly allergy shots.  School is stressful due to the workload and his goal of maintaining a high GPA.   PAST MEDICAL HISTORY: Past Medical History:  Diagnosis Date   Crohn's disease ([facility])    Depression    Eczema    Uveitis    Immunization History  Administered Date(s) Administered   Hepatitis B, ped/adol 01/14/2021   Influenza,inj,Quad PF,6+ Mos 08/02/2018   Influenza-Unspecified 09/10/2021   PFIZER(Purple Top)SARS-COV-2 Vaccination 01/26/2020, 02/19/2020, 06/14/2020   Pfizer Covid-19 Vaccine Bivalent Booster 33yr & up 09/10/2021   Tdap 03/15/2013    PAST SURGICAL HISTORY: Past Surgical History:  Procedure Laterality Date   TURBINATE REDUCTION Bilateral 03/10/2021   Procedure: BILATERAL TURBINATE REDUCTION;  Surgeon: [name];  Location: [facility]  Service: ENT;  Laterality: Bilateral;    SOCIAL HISTORY: Social History   Socioeconomic History   Marital status: Single    Spouse name: Not on file   Number of children: Not on file   Years of education: Not on file   Highest education level: Not on file  Occupational History   Not on file  Tobacco Use   Smoking status: Never   Smokeless tobacco: Never  Vaping Use   Vaping Use: Never used  Substance and Sexual Activity   Alcohol use: Never   Drug use: Never   Sexual activity: Not Currently    Partners: Female  Other Topics Concern   Not on file  Social History Narrative   No contact with father. Lives with mom, sister, Maternal grandparents, aunt, uncle, 1 dog Graduated from [facility] Going to go to [employer].   Social Determinants of [place]   [place] Strain: Not on file  Food Insecurity: Not on file  Transportation Needs: Not on file  Physical Activity: Not on file  Stress: Not on file  Social Connections: Not on file    FAMILY HISTORY: family history includes Cancer in his maternal grandfather; Glaucoma in his maternal grandfather;  Hyperlipidemia in his maternal grandfather; Hypertension in his father, maternal grandfather, maternal grandmother, and mother.    REVIEW OF SYSTEMS:  The balance of 12 systems reviewed is negative except as noted in the HPI.   MEDICATIONS: Current Outpatient Medications  Medication Sig Dispense Refill   Adalimumab (HUMIRA PEN) 40 MG/0.8ML PNKT Inject 1 pen (40 mg/0.8 mL) into skin every 14 days. 6 each 1   B-D 3CC LUER-LOK SYR 25GX5/8" 25G X 5/8" 3 ML MISC USE TO INJECT METHOTREXATE INTO THE SKIN ONCE A WEEK.     Cholecalciferol 125 MCG (5000 UT) capsule Take by mouth.     clindamycin-benzoyl peroxide (BENZACLIN) gel Apply 1 application topically daily.  3   loratadine (CLARITIN REDITABS) 10 MG dissolvable tablet Take by mouth.     molnupiravir EUA (LAGEVRIO) 200 mg CAPS capsule Take 4 capsules (800 mg total) by mouth 2 (two) times daily for 5 days. 40 capsule 0   tacrolimus (PROTOPIC) 0.1 % ointment APPLY TO  THE ECZEMA PRONE AREAS DAILY 60 g 2   tretinoin (RETIN-A) 0.05 % cream Apply on the skin nightly; Use a few nights a week and increase to nightly as tolerated. 20 g 2   buPROPion (WELLBUTRIN XL) 150 MG 24 hr tablet TAKE 3 TABLETS BY MOUTH ONCE A DAY (Patient not taking: Reported on 09/28/2021) 90 tablet 5   Dapsone 5 % topical gel APPLY 1 APPLICATION ON THE SKIN IN THE MORNING (Patient not taking: Reported on 09/28/2021) 60 g 1   EPINEPHrine 0.3 mg/0.3 mL IJ SOAJ injection Inject into the muscle as directed. (Patient not taking: Reported on 09/28/2021)     escitalopram (LEXAPRO) 10 MG tablet TAKE 1 TABLET BY MOUTH DAILY (Patient not taking: Reported on 09/28/2021) 30 tablet 5   fexofenadine (ALLEGRA ALLERGY) 180 MG tablet Take 1 tablet by mouth daily as needed for 15 days. 15 tablet 0   FOLIC ACID PO Take by mouth. (Patient not taking: Reported on 09/28/2021)     HYDROcodone bit-homatropine (HYCODAN) 5-1.5 MG/5ML syrup Take 5 mLs by mouth every 8 (eight) hours as needed for cough. (Patient  not taking: Reported on 09/28/2021) 120 mL 0   levocetirizine (XYZAL) 5 MG tablet TAKE 1 TABLET BY MOUTH EVERY EVENING (Patient not taking: Reported on 09/28/2021) 30 tablet 5   predniSONE (DELTASONE) 20 MG tablet Take 3 tablets by mouth daily for 5 days (Patient not taking: Reported on 09/28/2021) 15 tablet 0   No current facility-administered medications for this visit.    ALLERGIES: Patient has no known allergies.  VITAL SIGNS: Wt 219 lb (99.3 kg)   BMI 33.30 kg/m   PHYSICAL EXAM: Looked well on video exam  DIAGNOSTIC STUDIES:  I have reviewed all pertinent diagnostic studies, including:  MRE 03/21/2020 1. Small cystic hepatic foci not present in 2019 question of subtle peripheral enhancement and edema in the hepatic parenchyma. Small micro abscesses are considered perhaps related to portal dissemination in the setting of recent GI infection. Would suggest follow-up to ensure resolution assuming there has been some therapy in the interval. Again these were not seen in 2019. 2. Mild restricted diffusion and minimal hyperenhancement associated with the RIGHT colon. Appearance grossly similar to the prior study. No small bowel abnormalities are demonstrated to suggest enteritis though there is mild fecalization of the distal ileum which is nonspecific. Study mildly limited by field of view on today's examination. No signs of obstruction, fistula or abscess. Perianal region not examined.  Recent Results (from the past 2160 hour(s))  Vitamin D (25 hydroxy)     Status: None   Collection Time: 07/23/21  2:35 PM  Result Value Ref Range   Vit D, 25-Hydroxy 87 30 - 100 ng/mL    Comment: Vitamin D Status         25-OH Vitamin D: . Deficiency:                    <20 ng/mL Insufficiency:             20 - 29 ng/mL Optimal:                 > or = 30 ng/mL . For 25-OH Vitamin D testing on patients on  D2-supplementation and patients for whom quantitation  of D2 and D3 fractions is  required, the [facility](TM) 25-OH VIT D, (D2,D3), LC/MS/MS is recommended: order  code [phone] (patients >14yr). See Note 1 . Note 1 . For additional information, please refer to  http://education.[facility].com/faq/FAQ199  (This link is being provided for informational/ educational purposes only.)   Gamma GT     Status: Abnormal   Collection Time: 07/23/21  2:35 PM  Result Value Ref Range   GGT 45 (H) 9 - 31 Billy/L  Sedimentation rate     Status: None   Collection Time: 07/23/21  2:35 PM  Result Value Ref Range   Sed Rate 2 0 - 15 mm/h  C-reactive protein     Status: None   Collection Time: 07/23/21  2:35 PM  Result Value Ref Range   CRP 0.7 <8.0 mg/L  Comprehensive metabolic panel     Status: None   Collection Time: 07/23/21  2:35 PM  Result Value Ref Range   Glucose, Bld 90 65 - 139 mg/dL    Comment: .        Non-fasting reference interval .    BUN 12 7 - 20 mg/dL   Creat 0.89 0.60 - 1.24 mg/dL   BUN/Creatinine Ratio NOT APPLICABLE 6 - 22 (calc)   Sodium 139 135 - 146 mmol/L   Potassium 4.4 3.8 - 5.1 mmol/L   Chloride 104 98 - 110 mmol/L   CO2 28 20 - 32 mmol/L   Calcium 10.1 8.9 - 10.4 mg/dL   Total Protein 7.6 6.3 - 8.2 g/dL   Albumin 4.7 3.6 - 5.1 g/dL   Globulin 2.9 2.1 - 3.5 g/dL (calc)   AG Ratio 1.6 1.0 - 2.5 (calc)   Total Bilirubin 0.6 0.2 - 1.1 mg/dL   Alkaline phosphatase (APISO) 53 46 - 169 Billy/L   AST 29 12 - 32 Billy/L   ALT 46 8 - 46 Billy/L  CBC with Differential/Platelet     Status: None   Collection Time: 07/23/21  2:35 PM  Result Value Ref Range   WBC 5.3 3.8 - 10.8 Thousand/uL   RBC 4.95 4.20 - 5.80 Million/uL   Hemoglobin 15.0 13.2 - 17.1 g/dL   HCT 45.5 38.5 - 50.0 %   MCV 91.9 80.0 - 100.0 fL   MCH 30.3 27.0 - 33.0 pg   MCHC 33.0 32.0 - 36.0 g/dL   RDW 11.7 11.0 - 15.0 %   Platelets 367 140 - 400 Thousand/uL   MPV 9.8 7.5 - 12.5 fL   Neutro Abs 2,427 1,500 - 7,800 cells/uL   Lymphs Abs 2,141 850 - 3,900 cells/uL   Absolute Monocytes  535 200 - 950 cells/uL   Eosinophils Absolute 143 15 - 500 cells/uL   Basophils Absolute 53 0 - 200 cells/uL   Neutrophils Relative % 45.8 %   Total Lymphocyte 40.4 %   Monocytes Relative 10.1 %   Eosinophils Relative 2.7 %   Basophils Relative 1.0 %  CBC with Differential/Platelet     Status: None   Collection Time: 09/10/21  2:48 PM  Result Value Ref Range   WBC 7.9 3.8 - 10.8 Thousand/uL   RBC 4.74 4.20 - 5.80 Million/uL   Hemoglobin 14.8 13.2 - 17.1 g/dL   HCT 43.6 38.5 - 50.0 %   MCV 92.0 80.0 - 100.0 fL   MCH 31.2 27.0 - 33.0 pg   MCHC 33.9 32.0 - 36.0 g/dL   RDW 11.5 11.0 - 15.0 %   Platelets 339 140 - 400 Thousand/uL   MPV 9.8 7.5 - 12.5 fL   Neutro Abs 3,903 1,500 - 7,800 cells/uL   Lymphs Abs 3,160 850 - 3,900 cells/uL   Absolute Monocytes 545 200 - 950 cells/uL  Eosinophils Absolute 253 15 - 500 cells/uL   Basophils Absolute 40 0 - 200 cells/uL   Neutrophils Relative % 49.4 %   Total Lymphocyte 40.0 %   Monocytes Relative 6.9 %   Eosinophils Relative 3.2 %   Basophils Relative 0.5 %  Sedimentation rate     Status: None   Collection Time: 09/10/21  2:48 PM  Result Value Ref Range   Sed Rate 2 0 - 15 mm/h  C-reactive protein     Status: None   Collection Time: 09/10/21  2:48 PM  Result Value Ref Range   CRP 1.1 <8.0 mg/L  Comprehensive metabolic panel     Status: None   Collection Time: 09/10/21  2:48 PM  Result Value Ref Range   Glucose, Bld 80 65 - 139 mg/dL    Comment: .        Non-fasting reference interval .    BUN 13 7 - 20 mg/dL   Creat 0.75 0.60 - 1.24 mg/dL   BUN/Creatinine Ratio NOT APPLICABLE 6 - 22 (calc)   Sodium 138 135 - 146 mmol/L   Potassium 4.1 3.8 - 5.1 mmol/L   Chloride 105 98 - 110 mmol/L   CO2 24 20 - 32 mmol/L   Calcium 9.8 8.9 - 10.4 mg/dL   Total Protein 7.4 6.3 - 8.2 g/dL   Albumin 4.5 3.6 - 5.1 g/dL   Globulin 2.9 2.1 - 3.5 g/dL (calc)   AG Ratio 1.6 1.0 - 2.5 (calc)   Total Bilirubin 0.6 0.2 - 1.1 mg/dL   Alkaline  phosphatase (APISO) 49 46 - 169 Billy/L   AST 17 12 - 32 Billy/L   ALT 27 8 - 46 Billy/L  Gamma GT     Status: Abnormal   Collection Time: 09/10/21  2:48 PM  Result Value Ref Range   GGT 37 (H) 9 - 31 Billy/L  POC COVID-19     Status: Abnormal   Collection Time: 09/24/21  1:27 PM  Result Value Ref Range   SARS Coronavirus 2 Ag Positive (A) Negative  POCT Influenza A/B     Status: Normal   Collection Time: 09/24/21  1:27 PM  Result Value Ref Range   Influenza A, POC Negative Negative   Influenza B, POC Negative Negative    June 2022  Surgical pathology exam Order: [id number] Component 2 mo ago  Diagnosis    A: Stomach, biopsy - Gastric mucosa with no diagnostic alterations including no Helicobacter pylori organisms seen   B: Small bowel, duodenum, biopsy - Duodenal mucosa with no diagnostic alterations including no evidence of celiac disease   C: Esophagus, biopsy - Fragments of esophageal squamous epithelium with no diagnostic alterations including no increased eosinophil infiltrate   D: Colon, right, biopsy - Chronic active colitis with mild to moderate activity, consistent with the patient's clinical history of Crohn's disease - No evidence of dysplasia   E: Colon, transverse, biopsy - Chronic active colitis with moderate activity and granulomatous formation, consistent with the patient's clinical history of Crohn's disease - No evidence of dysplasia   F: Colon, left, biopsy - Chronic colitis with focal mild activity and granulomatous formation, consistent with the patient's clinical history of Crohn's disease - No evidence of dysplasia   G: Small bowel, terminal ileum, biopsy - Small bowel mucosa with prominent lymphoid aggregates, consistent with ileal mucosa with no diagnostic alterations   This electronic signature is attestation that the pathologist personally reviewed the submitted material(s) and the final diagnosis reflects  that evaluation.  Electronically signed by Lewie Chamber, MD on 04/17/2021 at  1:28    01/25/20 Gross appearance of colon "The perianal and digital rectal examinations were normal. An area of mildly congested mucosa with numerous discrete aphthae with a rim of mild erythema interspersed in areas of well-appearing colon throughout the transverse and ascending colon. No bleeding, stricturing, deep or serpiginous ulcerations." Biopsies A: Stomach, biopsy - Chronic superficial active gastritis with rare intramucosal granulomas compatible with involvement by patient's known history of inflammatory bowel disease - Reactive foveolar hyperplasia - An immunohistochemical stain for H. Pylori has been ordered and the results will be issued in an addendum - No evidence of malignancy, dysplasia, or metaplasia  B: Esophagus, biopsy - Superficial strips of benign squamous mucosa - No evidence of dysplasia or metaplasia  C: Small bowel, duodenum, biopsy - Acute and chronic duodenitis compatible with involvement by patient's known history of inflammatory bowel disease - No evidence of malignancy or dysplasia  D: Colon, right, biopsy - Focally active chronic colitis consistent with involvement by patient's known history of inflammatory bowel disease - No evidence of malignancy, dysplasia, granulomas, or viral cytopathic effect  E: Colon, transverse, biopsy - Focally active chronic colitis consistent with involvement by patient's known history of inflammatory bowel disease - No evidence of malignancy, dysplasia, granulomas, or viral cytopathic effect  F: Colon, left, biopsy - Features of chronic mucosal injury - No evidence of active colitis in this specimen - No evidence of malignancy, dysplasia, granulomas, or viral cytopathic effect  G: Small bowel, terminal ileum, biopsy - Focally active chronic ileitis compatible with involvement by patient's known history of inflammatory bowel disease - No evidence of malignancy, dysplasia, granulomas, or viral  cytopathic   Bentley Haralson A. Yehuda Savannah, MD Chief, Division of Pediatric Gastroenterology Professor of Pediatrics

## 2021-10-01 ENCOUNTER — Other Ambulatory Visit (HOSPITAL_COMMUNITY): Payer: Self-pay

## 2021-10-02 ENCOUNTER — Other Ambulatory Visit (HOSPITAL_COMMUNITY): Payer: Self-pay

## 2021-10-02 DIAGNOSIS — J3081 Allergic rhinitis due to animal (cat) (dog) hair and dander: Secondary | ICD-10-CM | POA: Diagnosis not present

## 2021-10-02 DIAGNOSIS — J3089 Other allergic rhinitis: Secondary | ICD-10-CM | POA: Diagnosis not present

## 2021-10-02 DIAGNOSIS — J301 Allergic rhinitis due to pollen: Secondary | ICD-10-CM | POA: Diagnosis not present

## 2021-10-06 DIAGNOSIS — J343 Hypertrophy of nasal turbinates: Secondary | ICD-10-CM | POA: Diagnosis not present

## 2021-10-06 DIAGNOSIS — J310 Chronic rhinitis: Secondary | ICD-10-CM | POA: Diagnosis not present

## 2021-10-07 ENCOUNTER — Ambulatory Visit (INDEPENDENT_AMBULATORY_CARE_PROVIDER_SITE_OTHER): Payer: 59 | Admitting: Adult Health

## 2021-10-07 ENCOUNTER — Other Ambulatory Visit: Payer: Self-pay

## 2021-10-07 ENCOUNTER — Encounter: Payer: Self-pay | Admitting: Adult Health

## 2021-10-07 DIAGNOSIS — F331 Major depressive disorder, recurrent, moderate: Secondary | ICD-10-CM | POA: Diagnosis not present

## 2021-10-07 NOTE — Progress Notes (Signed)
Billy Allen [id number] September 05, 2001 20 y.o.  Subjective:   Patient ID:  Billy Allen is a 20 y.o. (DOB 2001-06-07) male.  Chief Complaint: No chief complaint on file.   HPI [name] presents to the office today for follow-up of MDD.   Describes mood today as "ok". Denies tearfulness. Mood symptoms - denies anxiety and irritability. Decreased depression. Stating "I still feel more anhedonic". Stopped taking Wellbutrin and Lexapro - considers restarting at times. Has been using vitamin supplementation. Debating on whether to restart medications. Stating "I'm managing". Has met some people on campus and is part of a "friend group". Focused more on class work - taking one less class next semester. Seeing Lanetta Inch for therapy. Taking medications as prescribed.  Energy levels stable. Active, has a regular exercise routine. Enjoys some usual interests and activities. Student. Living on campus. Family local. Spending time with family and friends. Appetite adequate. Weight gain - 210+ pounds. Sleeps well most nights. Averages 6 to 7 hours.  Focus and concentration stable. Completing tasks. Managing aspects of household. Student - sophomore at [employer]. Denies SI or HI.  Denies AH or VH.  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  Neurological:  Negative for tremors.  Psychiatric/Behavioral:         Please refer to HPI   Medications: I have reviewed the patient's current medications.  Current Outpatient Medications  Medication Sig Dispense Refill   Adalimumab  (HUMIRA PEN) 40 MG/0.8ML PNKT Inject 1 pen (40 mg/0.8 mL) into skin every 14 days. 6 each 1   B-D 3CC LUER-LOK SYR 25GX5/8" 25G X 5/8" 3 ML MISC USE TO INJECT METHOTREXATE INTO THE SKIN ONCE A WEEK.     buPROPion (WELLBUTRIN XL) 150 MG 24 hr tablet TAKE 3 TABLETS BY MOUTH ONCE A DAY (Patient not taking: Reported on 09/28/2021) 90 tablet 5   Cholecalciferol 125 MCG (5000 UT) capsule Take by mouth.     clindamycin-benzoyl peroxide (BENZACLIN) gel Apply 1 application topically daily.  3   Dapsone 5 % topical gel APPLY 1 APPLICATION ON THE SKIN IN THE MORNING (Patient not taking: Reported on 09/28/2021) 60 g 1   EPINEPHrine 0.3 mg/0.3 mL IJ SOAJ injection Inject into the muscle as directed. (Patient not taking: Reported on 09/28/2021)     escitalopram (LEXAPRO) 10 MG tablet TAKE 1 TABLET BY MOUTH DAILY (Patient not taking: Reported on 09/28/2021) 30 tablet 5   fexofenadine (ALLEGRA ALLERGY) 180 MG tablet Take 1 tablet by mouth daily as needed for 15 days. 15 tablet 0   FOLIC ACID PO Take by mouth. (Patient not taking: Reported on 09/28/2021)     HYDROcodone bit-homatropine (HYCODAN) 5-1.5 MG/5ML syrup Take 5 mLs by mouth every 8 (eight) hours as needed for cough. (Patient not taking: Reported on 09/28/2021) 120 mL 0   levocetirizine (XYZAL) 5 MG tablet TAKE 1 TABLET BY MOUTH EVERY EVENING (Patient not taking: Reported on 09/28/2021) 30 tablet 5   loratadine (CLARITIN REDITABS) 10 MG dissolvable tablet Take by  mouth.     predniSONE (DELTASONE) 20 MG tablet Take 3 tablets by mouth daily for 5 days (Patient not taking: Reported on 09/28/2021) 15 tablet 0   tacrolimus (PROTOPIC) 0.1 % ointment APPLY TO THE ECZEMA PRONE AREAS DAILY 60 g 2   tretinoin (RETIN-A) 0.05 % cream Apply on the skin nightly; Use a few nights a week and increase to nightly as tolerated. 20 g 2   No current facility-administered medications for this visit.    Medication Side Effects: None  Allergies: No Known Allergies  Past Medical  History:  Diagnosis Date   Crohn's disease ([place])    Depression    Eczema    Uveitis     Past Medical History, Surgical history, Social history, and Family history were reviewed and updated as appropriate.   Please see review of systems for further details on the patient's review from today.   Objective:   Physical Exam:  There were no vitals taken for this visit.  Physical Exam Constitutional:      General: He is not in acute distress. Musculoskeletal:        General: No deformity.  Neurological:     Mental Status: He is alert and oriented to person, place, and time.     Coordination: Coordination normal.  Psychiatric:        Attention and Perception: Attention and perception normal. He does not perceive auditory or visual hallucinations.        Mood and Affect: Mood normal. Mood is not anxious or depressed. Affect is not labile, blunt, angry or inappropriate.        Speech: Speech normal.        Behavior: Behavior normal.        Thought Content: Thought content normal. Thought content is not paranoid or delusional. Thought content does not include homicidal or suicidal ideation. Thought content does not include homicidal or suicidal plan.        Cognition and Memory: Cognition and memory normal.        Judgment: Judgment normal.     Comments: Insight intact    Lab Review:     Component Value Date/Time   NA 138 09/10/2021 1448   K 4.1 09/10/2021 1448   CL 105 09/10/2021 1448   CO2 24 09/10/2021 1448   GLUCOSE 80 09/10/2021 1448   BUN 13 09/10/2021 1448   CREATININE 0.75 09/10/2021 1448   CALCIUM 9.8 09/10/2021 1448   PROT 7.4 09/10/2021 1448   ALBUMIN 3.0 (L) 11/24/2019 1315   AST 17 09/10/2021 1448   ALT 27 09/10/2021 1448   ALKPHOS 150 11/24/2019 1315   BILITOT 0.6 09/10/2021 1448   GFRNONAA NOT CALCULATED 11/26/2019 0649   GFRAA NOT CALCULATED 11/26/2019 0649       Component Value Date/Time   WBC 7.9 09/10/2021 1448   RBC 4.74 09/10/2021 1448   HGB 14.8  09/10/2021 1448   HCT 43.6 09/10/2021 1448   PLT 339 09/10/2021 1448   MCV 92.0 09/10/2021 1448   MCH 31.2 09/10/2021 1448   MCHC 33.9 09/10/2021 1448   RDW 11.5 09/10/2021 1448   LYMPHSABS 3,160 09/10/2021 1448   MONOABS 0.6 11/27/2019 1549   EOSABS 253 09/10/2021 1448   BASOSABS 40 09/10/2021 1448    No results found for: POCLITH, LITHIUM   No results found for: PHENYTOIN, PHENOBARB, VALPROATE, CBMZ   .res Assessment: Plan:    Plan:  Taking herbal supplements and would like to continue for now.  D/C  Wellbutrin D/C Lexapro  Time spent with patient was 20 minutes. Greater than 50% of face to face time with patient was spent on counseling and coordination of care.  RTC 3 months   Diagnoses and all orders for this visit:  Major depressive disorder, recurrent episode, moderate ([place])    Please see After Visit Summary for patient specific instructions.  Future Appointments  Date Time Provider [place]  11/19/2021 11:00 AM Anson [place], [facility] CP-CP None  01/07/2022 11:20 AM Tiwanda Threats, Berdie Ogren, NP CP-CP None  03/08/2022  4:00 PM Kandis Ban, MD PS-[facility]    No orders of the defined types were placed in this encounter.   -------------------------------

## 2021-10-08 DIAGNOSIS — J3081 Allergic rhinitis due to animal (cat) (dog) hair and dander: Secondary | ICD-10-CM | POA: Diagnosis not present

## 2021-10-08 DIAGNOSIS — J3089 Other allergic rhinitis: Secondary | ICD-10-CM | POA: Diagnosis not present

## 2021-10-08 DIAGNOSIS — J301 Allergic rhinitis due to pollen: Secondary | ICD-10-CM | POA: Diagnosis not present

## 2021-10-08 DIAGNOSIS — L2089 Other atopic dermatitis: Secondary | ICD-10-CM | POA: Diagnosis not present

## 2021-10-13 ENCOUNTER — Other Ambulatory Visit (HOSPITAL_COMMUNITY): Payer: Self-pay

## 2021-10-15 ENCOUNTER — Other Ambulatory Visit (HOSPITAL_COMMUNITY): Payer: Self-pay

## 2021-10-16 DIAGNOSIS — J301 Allergic rhinitis due to pollen: Secondary | ICD-10-CM | POA: Diagnosis not present

## 2021-10-16 DIAGNOSIS — J3089 Other allergic rhinitis: Secondary | ICD-10-CM | POA: Diagnosis not present

## 2021-10-16 DIAGNOSIS — J3081 Allergic rhinitis due to animal (cat) (dog) hair and dander: Secondary | ICD-10-CM | POA: Diagnosis not present

## 2021-10-21 ENCOUNTER — Other Ambulatory Visit (HOSPITAL_COMMUNITY): Payer: Self-pay

## 2021-10-21 MED ORDER — FLUTICASONE PROPIONATE 50 MCG/ACT NA SUSP
NASAL | 5 refills | Status: AC
  Filled 2021-10-21: qty 16, 30d supply, fill #0
  Filled 2022-01-22: qty 16, 30d supply, fill #1
  Filled 2022-09-30: qty 16, 30d supply, fill #2

## 2021-10-23 DIAGNOSIS — J301 Allergic rhinitis due to pollen: Secondary | ICD-10-CM | POA: Diagnosis not present

## 2021-10-23 DIAGNOSIS — J3081 Allergic rhinitis due to animal (cat) (dog) hair and dander: Secondary | ICD-10-CM | POA: Diagnosis not present

## 2021-10-23 DIAGNOSIS — J3089 Other allergic rhinitis: Secondary | ICD-10-CM | POA: Diagnosis not present

## 2021-10-23 NOTE — Telephone Encounter (Signed)
[place] if [place] still wants to set up an appointment?

## 2021-10-29 DIAGNOSIS — J301 Allergic rhinitis due to pollen: Secondary | ICD-10-CM | POA: Diagnosis not present

## 2021-10-29 DIAGNOSIS — J3089 Other allergic rhinitis: Secondary | ICD-10-CM | POA: Diagnosis not present

## 2021-10-29 DIAGNOSIS — J3081 Allergic rhinitis due to animal (cat) (dog) hair and dander: Secondary | ICD-10-CM | POA: Diagnosis not present

## 2021-11-02 ENCOUNTER — Encounter (INDEPENDENT_AMBULATORY_CARE_PROVIDER_SITE_OTHER): Payer: Self-pay | Admitting: Pediatric Gastroenterology

## 2021-11-03 ENCOUNTER — Other Ambulatory Visit (HOSPITAL_COMMUNITY): Payer: Self-pay

## 2021-11-05 DIAGNOSIS — K6289 Other specified diseases of anus and rectum: Secondary | ICD-10-CM | POA: Diagnosis not present

## 2021-11-05 DIAGNOSIS — R399 Unspecified symptoms and signs involving the genitourinary system: Secondary | ICD-10-CM | POA: Diagnosis not present

## 2021-11-05 DIAGNOSIS — K5090 Crohn's disease, unspecified, without complications: Secondary | ICD-10-CM | POA: Diagnosis not present

## 2021-11-10 ENCOUNTER — Encounter: Payer: Self-pay | Admitting: Gastroenterology

## 2021-11-10 NOTE — Telephone Encounter (Signed)
Patient is scheduled to see an APP 11/30/2021.

## 2021-11-12 ENCOUNTER — Other Ambulatory Visit (HOSPITAL_COMMUNITY): Payer: Self-pay

## 2021-11-13 ENCOUNTER — Other Ambulatory Visit (HOSPITAL_COMMUNITY): Payer: Self-pay

## 2021-11-16 ENCOUNTER — Other Ambulatory Visit (HOSPITAL_COMMUNITY): Payer: Self-pay

## 2021-11-19 ENCOUNTER — Ambulatory Visit (INDEPENDENT_AMBULATORY_CARE_PROVIDER_SITE_OTHER): Payer: 59 | Admitting: Mental Health

## 2021-11-19 DIAGNOSIS — F331 Major depressive disorder, recurrent, moderate: Secondary | ICD-10-CM | POA: Diagnosis not present

## 2021-11-19 NOTE — Progress Notes (Signed)
[facility] psychotherapy note  Name: KANI CHAUVIN Date: 11/19/2021 MRN: [id number] DOB: [birth year] PCP: [name], MD  Time spent: 46 minutes  Treatment:   Individual therapy  Virtual Visit via video Connected with patient by Alma Friendly with their informed consent, and verified patient privacy and that I am speaking with the correct person using two identifiers. I discussed the limitations, risks, security and privacy concerns of performing psychotherapy and management service by Alma Friendly and the availability of in person appointments. I also discussed with the patient that there may be a patient responsible charge related to this service. The patient expressed understanding and agreed to proceed. I discussed the treatment planning with the patient. The patient was provided an opportunity to ask questions and all were answered. The patient agreed with the plan and demonstrated an understanding of the instructions. The patient was advised to call  our office if  symptoms worsen or feel they are in a crisis state and need immediate contact.   Therapist Location: office Patient Location: home   Mental Status Exam:    Appearance:    Casual     Behavior:   Appropriate  Motor:   WNL  Speech/Language:    Clear and Coherent  Affect:   Full range   Mood:   Euthymic  Thought process:   normal  Thought content:     WNL  Sensory/Perceptual disturbances:     none  Orientation:   x4  Attention:   Good  Concentration:   Good  Memory:   Intact  Fund of knowledge:    Consistent with age and development  Insight:     Good  Judgment:    Good  Impulse Control:   Good     Reported Symptoms:  Low motivation / procrastination, negative talk-"you should be doing this", compulsive bx's at times  Risk Assessment: Danger to Self:  No Self-injurious Behavior: No Danger to Others: No he does not Duty to Warn:no Physical Aggression / Violence:No  Access to Firearms a concern: No  Gang  Involvement:No  Patient / guardian was educated about steps to take if suicide or homicide risk level increases between visits: yes While future psychiatric events cannot be accurately predicted, the patient does not currently require acute inpatient psychiatric care and does not currently meet [facility] involuntary commitment criteria.    Subjective:  Patient presents for telehealth session via video.  Patient shared how he completed last semester and has started a new semester over the past 2 weeks.  He stated that his course load is somewhat less intensive, nondistressed about school at this time, keeping up academically.  He primarily focused on the recent stressor related to the relationship with his girlfriend.  He stated that her dog passed away this week and she is experiencing grief.  He stated that they have had less contact as a result, that she needs some space which has resulted in her communicating with him but less often.  An example is her texting him after he text her but this can take up to 2 to 4 hours later, typically within minutes.  He shared how he wrote a letter to her but has not given it to euthymic, sharing some content in session.  Patient identified the need to give her the letter but plans to wait till possibly next week in an effort to continue to give her some emotional space.  Patient was encouraged to remind himself of what he knows to be  true versus what he is feeling emotionally which is more anxious, having thoughts about her being unfaithful or there being other issues in the relationship that have led to the change in her behavior.  Patient was encouraged to recognize, based on his feedback today, that she may in fact just need time to grieve, receive support.    Interventions: Further assessment, supportive therapy, CBT, MI   Diagnoses:    ICD-10-CM   1. Major depressive disorder, recurrent episode, moderate ([place])  F33.1              Plan: Patient  to continue journaling with a focus on areas mentioned above.  Patient to continue to identify thoughts and behaviors that adversely affect his ability to abstain from pornography use, eating unhealthy or playing video games excessively.  Long-term goal:   Reduce instead gratification of behaviors that interfere with patient achieving life goals as identified.  Increase self confidence.   Short-term goal:  Identify and remove barriers that increase tendencies toward instant gratification Identifying thoughts, feelings related to past events that persist and increased feelings of shame and guilt Identify thoughts, past events that are catalysts toward patient's self-described feelings of "self hatred" that can manifest   Assessment of progress:  progressing   Anson [place], [facility]

## 2021-11-23 ENCOUNTER — Other Ambulatory Visit (HOSPITAL_COMMUNITY): Payer: Self-pay

## 2021-11-30 ENCOUNTER — Encounter: Payer: Self-pay | Admitting: Gastroenterology

## 2021-11-30 ENCOUNTER — Telehealth: Payer: Self-pay

## 2021-11-30 ENCOUNTER — Ambulatory Visit (INDEPENDENT_AMBULATORY_CARE_PROVIDER_SITE_OTHER): Payer: 59 | Admitting: Gastroenterology

## 2021-11-30 ENCOUNTER — Other Ambulatory Visit (HOSPITAL_COMMUNITY): Payer: Self-pay

## 2021-11-30 ENCOUNTER — Other Ambulatory Visit: Payer: Self-pay | Admitting: Pharmacist

## 2021-11-30 VITALS — BP 110/76 | HR 80 | Ht 68.25 in | Wt 228.5 lb

## 2021-11-30 DIAGNOSIS — K5010 Crohn's disease of large intestine without complications: Secondary | ICD-10-CM

## 2021-11-30 DIAGNOSIS — K5080 Crohn's disease of both small and large intestine without complications: Secondary | ICD-10-CM | POA: Diagnosis not present

## 2021-11-30 MED ORDER — HUMIRA PEN 40 MG/0.8ML ~~LOC~~ PNKT
PEN_INJECTOR | SUBCUTANEOUS | 1 refills | Status: DC
  Filled 2021-11-30: qty 6, fill #0
  Filled 2021-12-11: qty 2, 28d supply, fill #0
  Filled 2022-01-19: qty 2, 28d supply, fill #1
  Filled 2022-02-15: qty 2, 28d supply, fill #2
  Filled 2022-03-11: qty 2, 28d supply, fill #3

## 2021-11-30 MED ORDER — HUMIRA PEN 40 MG/0.8ML ~~LOC~~ PNKT
PEN_INJECTOR | SUBCUTANEOUS | 1 refills | Status: DC
  Filled 2021-11-30: qty 6, fill #0

## 2021-11-30 NOTE — Patient Instructions (Signed)
If you are age 20 or older, your body mass index should be between 23-30. Your Body mass index is 34.49 kg/m. If this is out of the aforementioned range listed, please consider follow up with your Primary Care Provider.  If you are age 77 or younger, your body mass index should be between 19-25. Your Body mass index is 34.49 kg/m. If this is out of the aformentioned range listed, please consider follow up with your Primary Care Provider.   Please follow up with Dr.Armbruster in 3-4 months or sooner if needed.   We will work on your Humira prescription.   The [facility] providers would like to encourage you to use [facility] to communicate with providers for non-urgent requests or questions.  Due to long hold times on the telephone, sending your provider a message by [facility] may be a faster and more efficient way to get a response.  Please allow 48 business hours for a response.  Please remember that this is for non-urgent requests.   It was a pleasure to see you today!  Thank you for trusting me with your gastrointestinal care!     Alonza Bogus, PA-C

## 2021-11-30 NOTE — Progress Notes (Signed)
Agree with assessment / plan as outlined. Just can you clarify if he is still taking methotrexate or not, or just on Humira monotherapy at this point? If he is on methotrexate he should be taking folic acid. I will review his vaccination history with him at his next visit.

## 2021-11-30 NOTE — Telephone Encounter (Signed)
-----   Message from Loralie Champagne, PA-C sent at 11/30/2021  9:05 AM EST ----- This is a new patient with Crohn's disease.  Establishing here with Dr. Havery Moros from peds GI.  He is on Humira and will need this prescription renewed.    Thank you,  Jess

## 2021-11-30 NOTE — Telephone Encounter (Signed)
The prescription for Humira has been sent to the pharmacy.

## 2021-11-30 NOTE — Progress Notes (Addendum)
11/30/2021 Billy Allen [id number] [birth year]   HISTORY OF PRESENT ILLNESS: This is a 21 year old male who is new to our office.  He is transitioning his care to adult GI as he previously followed with Dr. Yehuda Savannah at [facility].  He was diagnosed with Crohn's disease in April 2021 at which time he was having severe diarrhea.  He was placed on prednisone and was started on methotrexate and folic acid after his colonoscopy at that time.  He then had another colonoscopy in June 2022 and after that discontinued methotrexate and folic acid and started Humira injections instead.  He has done really well with those.  He currently has no GI complaints.  His fecal calprotectin was still 1700 in May 2022 while on methotrexate and then after beginning Humira and being on that for a few months it was down to 73 in November 2022.  All other labs including inflammatory markers were unremarkable in November 2022 as well.  He was seen by the providers at [facility] in early January for some rectal pain/discomfort.  They told him it was probably a hemorrhoid.  He says that this only lasted for about 3 days and is now still resolved.  He denies any abdominal pain.  He says he has 1 or 2 formed bowel movements a day.  No rectal bleeding.   EGD 03/2021: - Normal esophagus.  - Normal stomach. Biopsied.  - Normal examined duodenum. Biopsied.   - Normal esophagus. Biopsied.   Colonoscopy 03/2021: The perianal and digital rectal examinations were normal.       The terminal ileum appeared normal. Estimated blood loss was minimal.       Biopsies were taken with a cold forceps for histology.       Inflammation characterized by multiple shallow ulcerations was found as patches in the transverse colon and in the ascending colon. The anus, the rectum, the recto-sigmoid colon, the sigmoid colon, the descending colon, the splenic flexure and the distal transverse colon were spared. The inflammation was moderate in severity.  Biopsies were taken with a cold forceps for histology. Estimated blood loss was minimal.   Diagnosis    A: Stomach, biopsy - Gastric mucosa with no diagnostic alterations including no Helicobacter pylori organisms seen   B: Small bowel, duodenum, biopsy - Duodenal mucosa with no diagnostic alterations including no evidence of celiac disease   C: Esophagus, biopsy - Fragments of esophageal squamous epithelium with no diagnostic alterations including no increased eosinophil infiltrate   D: Colon, right, biopsy - Chronic active colitis with mild to moderate activity, consistent with the patient's clinical history of Crohn's disease - No evidence of dysplasia   E: Colon, transverse, biopsy - Chronic active colitis with moderate activity and granulomatous formation, consistent with the patient's clinical history of Crohn's disease - No evidence of dysplasia   F: Colon, left, biopsy - Chronic colitis with focal mild activity and granulomatous formation, consistent with the patient's clinical history of Crohn's disease - No evidence of dysplasia   G: Small bowel, terminal ileum, biopsy - Small bowel mucosa with prominent lymphoid aggregates, consistent with ileal mucosa with no diagnostic alterations   MR enterography of the abdomen and pelvis in May 2021 as follows:  IMPRESSION: 1. Small cystic hepatic foci not present in 2019 question of subtle peripheral enhancement and edema in the hepatic parenchyma. Small micro abscesses are considered perhaps related to portal dissemination in the setting of recent GI infection. Would suggest follow-up to  ensure resolution assuming there has been some therapy in the interval. Again these were not seen in 2019. 2. Mild restricted diffusion and minimal hyperenhancement associated with the RIGHT colon. Appearance grossly similar to the prior study. No small bowel abnormalities are demonstrated to suggest enteritis though there is mild fecalization  of the distal ileum which is nonspecific. Study mildly limited by field of view on today's examination. No signs of obstruction, fistula or abscess. Perianal region not evaluated. 3. Stool fills much of the colon limiting assessment.   EGD 01/2020:     - Localized nodularity of the distal esophagus,        otherwise normal esophagus. Biopsied.      - Nodular mucosa in the gastric antrum. Biopsied.      - Normal examined duodenum. Biopsied.   Colonoscopy 01/2020:   -Congested, erythematous areas of colonic mucosa with apthae. Biopsied.    - The examined portion of the ileum was normal.  Biopsied.   A: Stomach, biopsy - Chronic superficial active gastritis with rare intramucosal granulomas compatible with involvement by patient's known history of inflammatory bowel disease - Reactive foveolar hyperplasia - An immunohistochemical stain for H. Pylori has been ordered and the results will be issued in an addendum - No evidence of malignancy, dysplasia, or metaplasia   B: Esophagus, biopsy - Superficial strips of benign squamous mucosa - No evidence of dysplasia or metaplasia   C: Small bowel, duodenum, biopsy - Acute and chronic duodenitis compatible with involvement by patient's known history of inflammatory bowel disease - No evidence of malignancy or dysplasia   D: Colon, right, biopsy - Focally active chronic colitis consistent with involvement by patient's known history of inflammatory bowel disease - No evidence of malignancy, dysplasia, granulomas, or viral cytopathic effect   E: Colon, transverse, biopsy - Focally active chronic colitis consistent with involvement by patient's known history of inflammatory bowel disease - No evidence of malignancy, dysplasia, granulomas, or viral cytopathic effect   F: Colon, left, biopsy - Features of chronic mucosal injury - No evidence of active colitis in this specimen - No evidence of malignancy, dysplasia, granulomas, or viral  cytopathic effect   G: Small bowel, terminal ileum, biopsy - Focally active chronic ileitis compatible with involvement by patient's known history of inflammatory bowel disease - No evidence of malignancy, dysplasia, granulomas, or viral cytopathic effect  Referred here by his PCP, Dr. Ronnald Ramp.   Past Medical History:  Diagnosis Date   Crohn's disease ([place])    Depression    Eczema    Uveitis    Past Surgical History:  Procedure Laterality Date   TURBINATE REDUCTION Bilateral 03/10/2021   Procedure: BILATERAL TURBINATE REDUCTION;  Surgeon: Leta Baptist, MD;  Location: [facility];  Service: ENT;  Laterality: Bilateral;    reports that he has never smoked. He has never used smokeless tobacco. He reports that he does not drink alcohol and does not use drugs. family history includes Diabetes in his maternal uncle and mother; Glaucoma in his maternal grandfather; Hyperlipidemia in his maternal grandfather; Hypertension in his father, maternal grandfather, maternal grandmother, and mother; Prostate cancer in his maternal grandfather. No Known Allergies    Outpatient Encounter Medications as of 11/30/2021  Medication Sig   Adalimumab (HUMIRA PEN) 40 MG/0.8ML PNKT Inject 1 pen (40 mg/0.8 mL) into skin every 14 days.   buPROPion (WELLBUTRIN XL) 150 MG 24 hr tablet TAKE 3 TABLETS BY MOUTH ONCE A DAY   Cholecalciferol 125 MCG (5000  UT) capsule Take by mouth.   clindamycin-benzoyl peroxide (BENZACLIN) gel Apply 1 application topically daily.   fexofenadine (ALLEGRA ALLERGY) 180 MG tablet Take 1 tablet by mouth daily as needed for 15 days.   fluticasone (FLONASE) 50 MCG/ACT nasal spray Use 2 sprays into both nostrils daily   FOLIC ACID PO Take by mouth.   loratadine (CLARITIN REDITABS) 10 MG dissolvable tablet Take by mouth.   tacrolimus (PROTOPIC) 0.1 % ointment Apply 1 Dose topically as needed.   tretinoin (RETIN-A) 0.05 % cream Apply on the skin nightly; Use a few nights a week and  increase to nightly as tolerated.   EPINEPHrine 0.3 mg/0.3 mL IJ SOAJ injection Inject into the muscle as directed. (Patient not taking: Reported on 09/28/2021)   [DISCONTINUED] amitriptyline (ELAVIL) 25 MG tablet Take 1 tablet (25 mg total) by mouth at bedtime as needed for sleep.   [DISCONTINUED] B-D 3CC LUER-LOK SYR 25GX5/8" 25G X 5/8" 3 ML MISC USE TO INJECT METHOTREXATE INTO THE SKIN ONCE A WEEK.   [DISCONTINUED] escitalopram (LEXAPRO) 10 MG tablet TAKE 1 TABLET BY MOUTH DAILY (Patient not taking: Reported on 09/28/2021)   [DISCONTINUED] HYDROcodone bit-homatropine (HYCODAN) 5-1.5 MG/5ML syrup Take 5 mLs by mouth every 8 (eight) hours as needed for cough. (Patient not taking: Reported on 09/28/2021)   [DISCONTINUED] levocetirizine (XYZAL) 5 MG tablet TAKE 1 TABLET BY MOUTH EVERY EVENING (Patient not taking: Reported on 09/28/2021)   [DISCONTINUED] predniSONE (DELTASONE) 20 MG tablet Take 3 tablets by mouth daily for 5 days (Patient not taking: Reported on 09/28/2021)   No facility-administered encounter medications on file as of 11/30/2021.     REVIEW OF SYSTEMS  : All other systems reviewed and negative except where noted in the History of Present Illness.   PHYSICAL EXAM: BP 110/76 (BP Location: Left Arm, Patient Position: Sitting, Cuff Size: Large)    Pulse 80    Ht 5' 8.25" (1.734 m) Comment: height measured without shoes   Wt 228 lb 8 oz (103.6 kg)    BMI 34.49 kg/m  General: Well developed Asian male in no acute distress Head: Normocephalic and atraumatic Eyes:  Sclerae anicteric, conjunctiva pink. Ears: Normal auditory acuity Lungs: Clear throughout to auscultation; no W/R/R. Heart: Regular rate and rhythm; no M/R/G. Abdomen: Soft, non-distended.  BS present.  Non-tender. Musculoskeletal: Symmetrical with no gross deformities  Skin: No lesions on visible extremities Extremities: No edema  Neurological: Alert oriented x 4, grossly non-focal Psychological:  Alert and cooperative.  Normal mood and affect  ASSESSMENT AND PLAN: *Crohn's disease of both the small and large intestines without complications: Initially diagnosed in April 2021.  Has been on Humira since about June or July 2022.  Doing well.  Symptoms well controlled.  Labs from November and fecal calprotectin were normal.  He is here to establish care with adult GI, transitioning from pediatric GI.  We will continue Humira as he is currently taking it.  Will work on renewing his prescription.  We will have him follow-up with Dr. Havery Moros in 3 to 4 months, can be seen sooner if needed in the interim.  CC:  [name], MD

## 2021-12-11 ENCOUNTER — Other Ambulatory Visit (HOSPITAL_COMMUNITY): Payer: Self-pay

## 2021-12-21 ENCOUNTER — Other Ambulatory Visit (HOSPITAL_COMMUNITY): Payer: Self-pay

## 2021-12-31 DIAGNOSIS — J3081 Allergic rhinitis due to animal (cat) (dog) hair and dander: Secondary | ICD-10-CM | POA: Diagnosis not present

## 2021-12-31 DIAGNOSIS — J301 Allergic rhinitis due to pollen: Secondary | ICD-10-CM | POA: Diagnosis not present

## 2021-12-31 IMAGING — CT CT ABD-PELV W/ CM
1 series · 1 of 1 positions shown · IV contrast (omnipaque)
Comparison: July 30, 2018

CLINICAL DATA: Left upper quadrant abdominal pain x3 weeks.

EXAM:
CT ABDOMEN AND PELVIS WITH CONTRAST
TECHNIQUE: Multidetector CT imaging of the abdomen and pelvis was performed
using the standard protocol following bolus administration of
intravenous contrast.
CONTRAST:  <See Chart> OMNIPAQUE IOHEXOL 300 MG/ML SOLN, 100mL
OMNIPAQUE IOHEXOL 300 MG/ML SOLN

[Series 2: topogram 0.6 t20f · sagittal · 1.00mm/px · 1 of 1 slices shown]
[im 1/1]
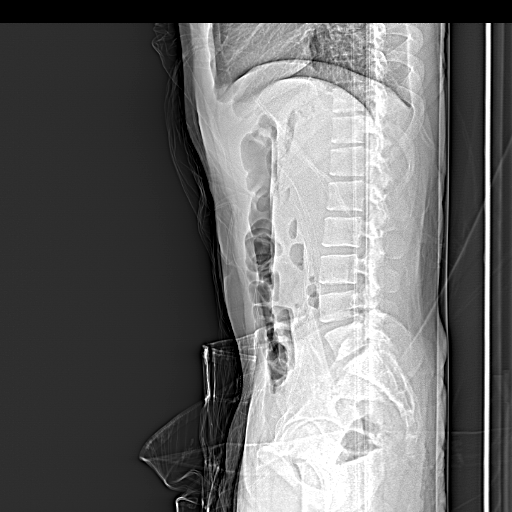

[1 of 1 positions shown; findings below may reference images not displayed]

FINDINGS: Lower chest: No acute abnormality.

Hepatobiliary: Innumerable punctate foci of low attenuation are seen
scattered throughout the liver. This represents a new finding when
compared to the prior study. No gallstones, gallbladder wall
thickening, or biliary dilatation.

Pancreas: Unremarkable. No pancreatic ductal dilatation or
surrounding inflammatory changes.

Spleen: Normal in size without focal abnormality.

Adrenals/Urinary Tract: Adrenal glands are unremarkable. Kidneys are
normal, without renal calculi, focal lesion, or hydronephrosis.
Bladder is unremarkable.

Stomach/Bowel: Stomach is within normal limits. Appendix appears
normal. No evidence of bowel wall thickening, distention, or
inflammatory changes.

Vascular/Lymphatic: No significant vascular findings are present. No
enlarged abdominal or pelvic lymph nodes.

Reproductive: Prostate is unremarkable.

Other: No abdominal wall hernia or abnormality. No abdominopelvic
ascites.

Musculoskeletal: No acute or significant osseous findings.
IMPRESSION: 1. Interval development of innumerable punctate foci of low
attenuation seen scattered throughout both lobes of the liver since
the prior abdomen pelvis CT dated July 30, 2018. While these may
represent numerous small cysts and/or hemangiomas, correlation with
follow-up contrast enhanced abdomen pelvis CT and/or hepatic
ultrasound is recommended.

## 2021-12-31 IMAGING — DX DG ABDOMEN ACUTE W/ 1V CHEST
3 series · 3 of 3 positions shown · non-contrast
Comparison: Abdominal film on 11/16/2019

CLINICAL DATA: Left upper abdominal pain with nausea and diarrhea.

EXAM:
DG ABDOMEN ACUTE W/ 1V CHEST

[chest pa]
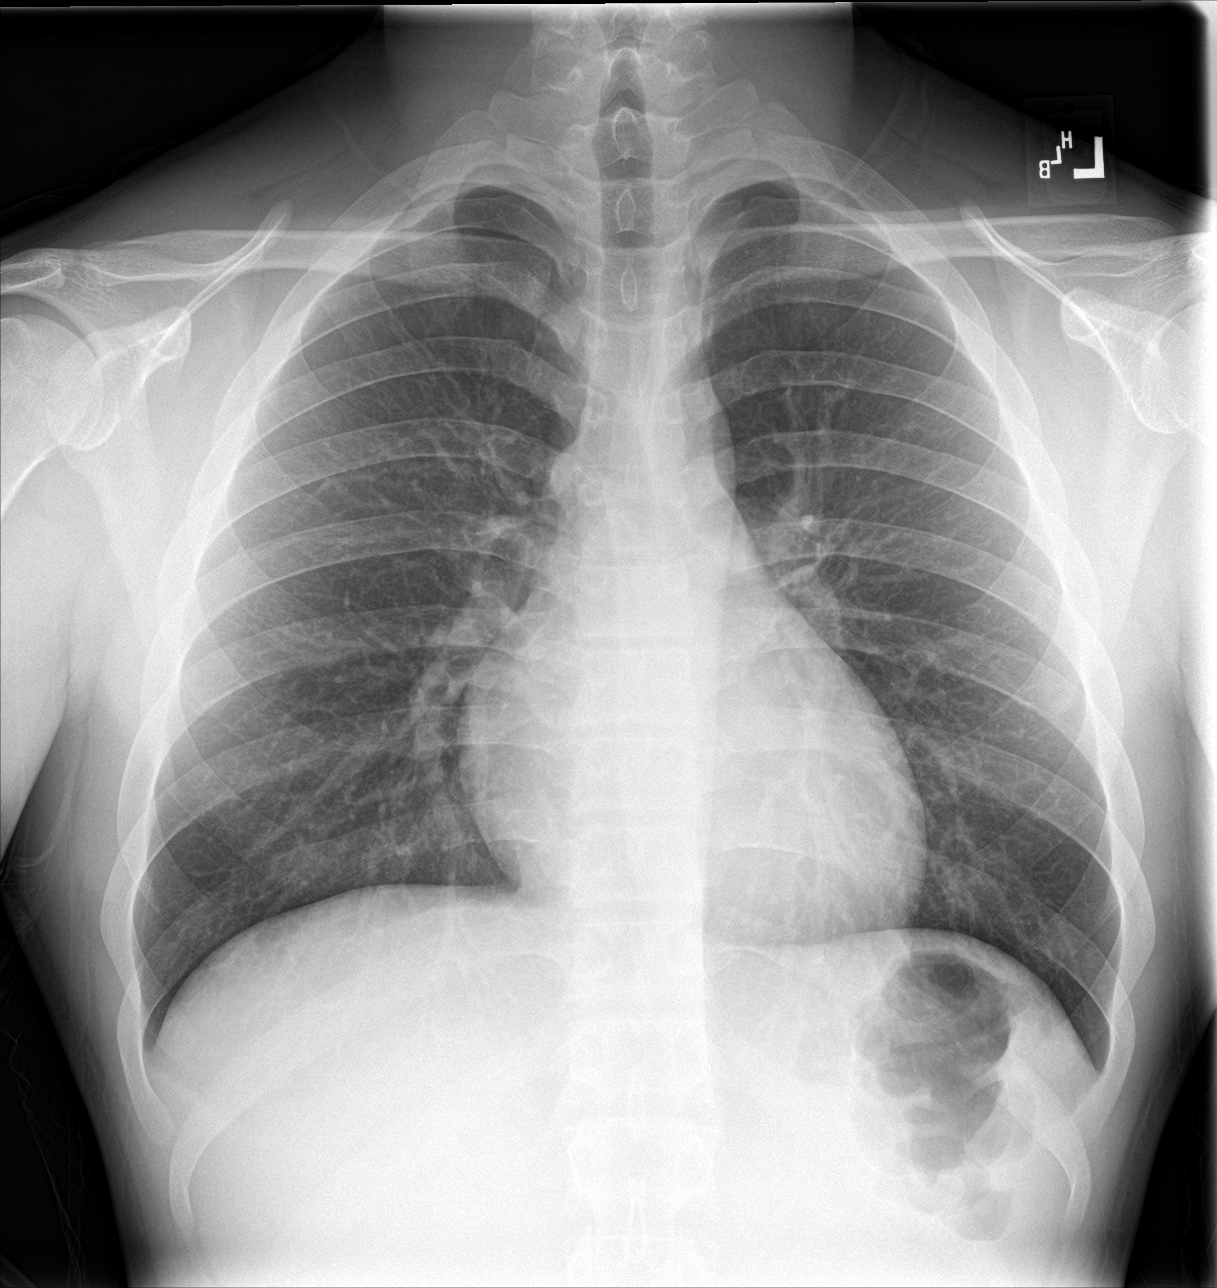

[abdomen erect]
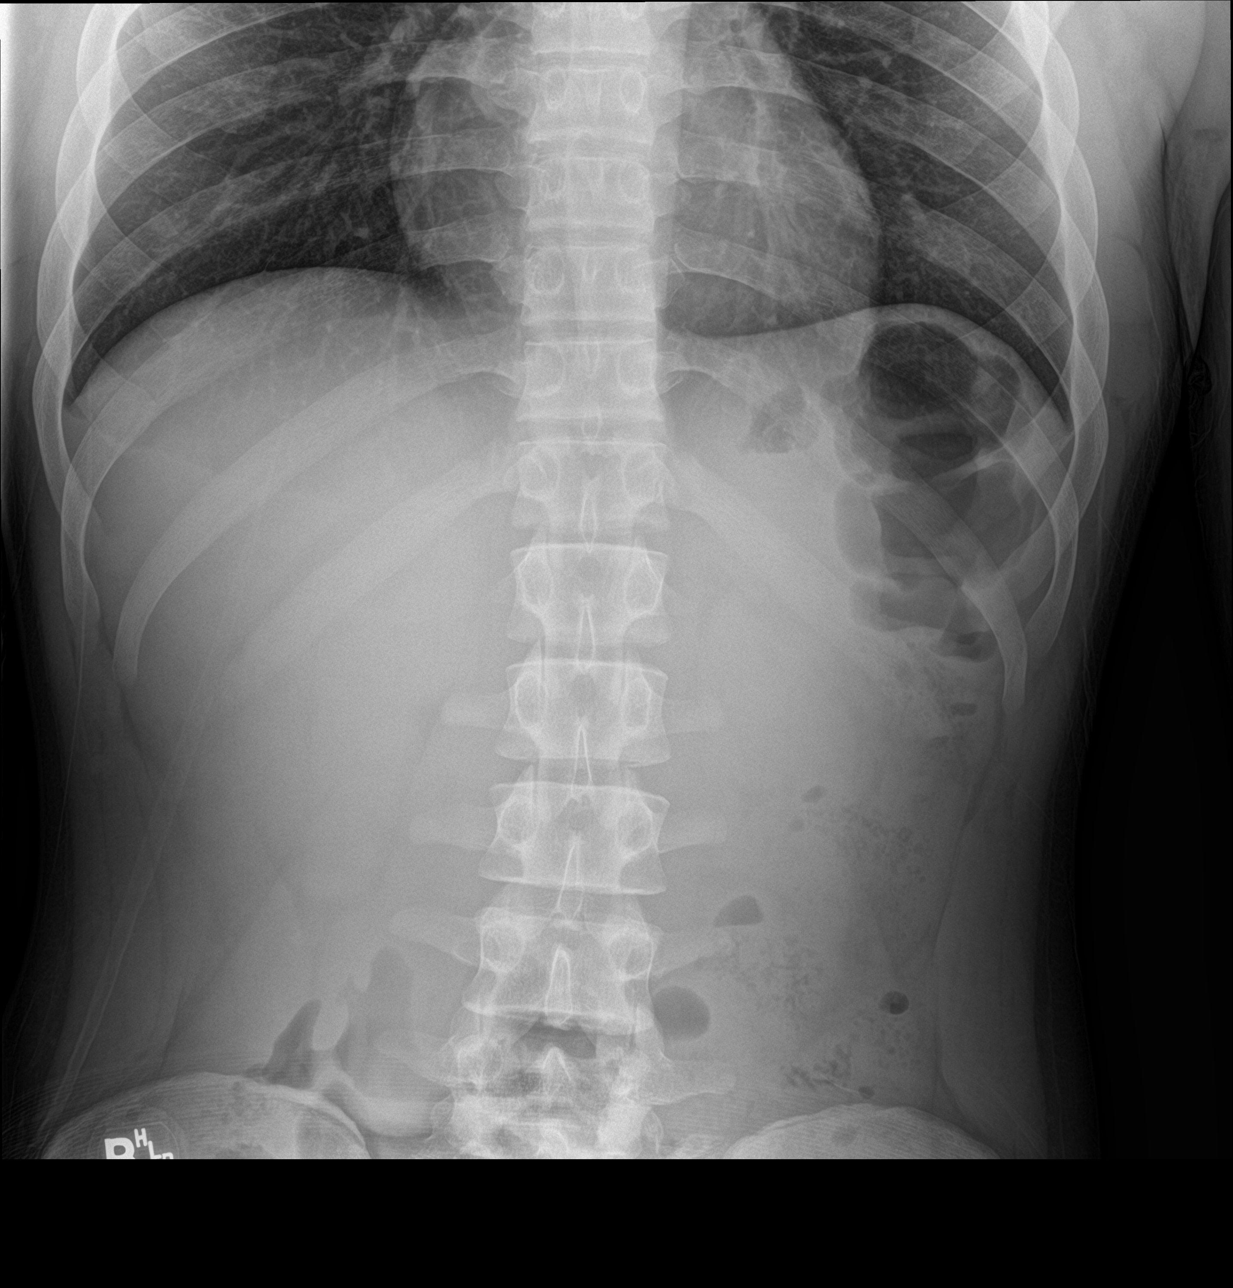

[abdomen supine]
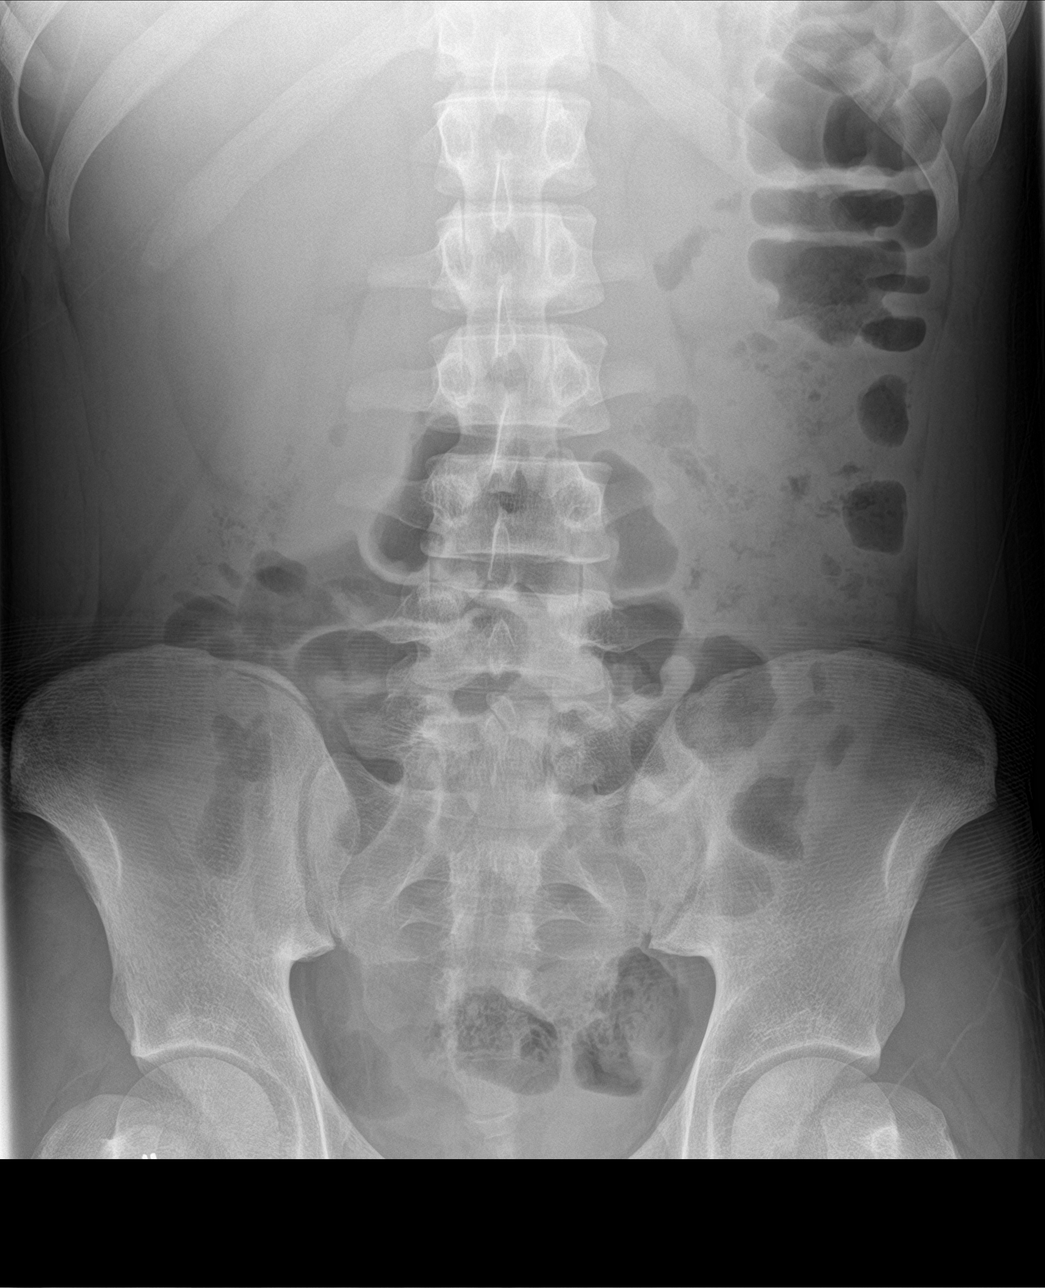

[3 of 3 positions shown; findings below may reference images not displayed]

FINDINGS: The heart size and mediastinal contours are within normal limits.
There is no evidence of pulmonary edema, consolidation,
pneumothorax, nodule or pleural fluid.

Abdominal films demonstrate no evidence of acute bowel obstruction,
ileus or free air. No abnormal calcifications. The visualized bony
structures are unremarkable.
IMPRESSION: No acute findings in the chest or abdomen.

## 2022-01-01 DIAGNOSIS — J3089 Other allergic rhinitis: Secondary | ICD-10-CM | POA: Diagnosis not present

## 2022-01-07 ENCOUNTER — Ambulatory Visit (INDEPENDENT_AMBULATORY_CARE_PROVIDER_SITE_OTHER): Payer: 59 | Admitting: Adult Health

## 2022-01-07 ENCOUNTER — Encounter: Payer: Self-pay | Admitting: Adult Health

## 2022-01-07 ENCOUNTER — Other Ambulatory Visit: Payer: Self-pay

## 2022-01-07 DIAGNOSIS — F331 Major depressive disorder, recurrent, moderate: Secondary | ICD-10-CM | POA: Diagnosis not present

## 2022-01-07 NOTE — Progress Notes (Signed)
Billy Allen ?[id number] ?February 17, 2001 ?21 y.o. ? ?Subjective:  ? ?Patient ID:  Billy Allen is a 21 y.o. (DOB 04-Feb-2001) male. ? ?Chief Complaint: No chief complaint on file. ? ? ?HPI ?Billy Allen presents to the office today for follow-up of MDD. ? ? ?Describes mood today as "ok". Denies tearfulness - "maybe twice a month". Mood symptoms - Doesn't feel depressed anymore. Reports situational anxiety. Denies irritability. Mood is consistent. Reports low motivations. Stating "I feel stable". Does not want to restart  medications. Doing well with class work. Has a "friend group". Seeing Lanetta Inch for therapy. Taking medications as prescribed.  ?Energy levels stable. Active, has a regular exercise routine. ?Enjoys some usual interests and activities. Student. Living on campus. Family local. Spending time with family and friends. ?Appetite adequate. Weight gain - 210+ pounds. ?Sleeps well most nights. Averages 6 to 7 hours.  ?Focus and concentration stable. Completing tasks. Managing aspects of household. Student - sophomore at [employer]. ?Denies SI or HI.  ?Denies AH or VH. ? ?Previous medication trials: Denies ? ? ? ?PHQ2-9   ? ?[place] Office Visit from 09/24/2021 in [place] [facility] [place]  ?PHQ-2 Total Score 0  ?PHQ-9 Total Score 0  ? ?  ? ?[place] [facility] from 06/16/2021 in [facility] at [facility] Admission (Discharged) from 03/10/2021 in [place]  ?C-SSRS RISK CATEGORY Error: Question 6 not populated No Risk  ? ?  ?  ? ?Review of Systems:  ?Review of Systems  ?Musculoskeletal:  Negative for gait problem.  ?Neurological:  Negative for tremors.  ?Psychiatric/Behavioral:    ?     Please refer to HPI  ? ?Medications: I have reviewed the patient's current medications. ? ?Current Outpatient Medications  ?Medication Sig Dispense Refill  ? Adalimumab (HUMIRA PEN) 40 MG/0.8ML PNKT Inject 1 pen (40 mg/0.8 mL) into skin every 14 days. 6 each 1  ? buPROPion (WELLBUTRIN XL) 150 MG 24 hr  tablet TAKE 3 TABLETS BY MOUTH ONCE A DAY 90 tablet 5  ? Cholecalciferol 125 MCG (5000 UT) capsule Take by mouth.    ? clindamycin-benzoyl peroxide (BENZACLIN) gel Apply 1 application topically daily.  3  ? EPINEPHrine 0.3 mg/0.3 mL IJ SOAJ injection Inject into the muscle as directed. (Patient not taking: Reported on 09/28/2021)    ? fexofenadine (ALLEGRA ALLERGY) 180 MG tablet Take 1 tablet by mouth daily as needed for 15 days. 15 tablet 0  ? fluticasone (FLONASE) 50 MCG/ACT nasal spray Use 2 sprays into both nostrils daily 16 g 5  ? FOLIC ACID PO Take by mouth.    ? loratadine (CLARITIN REDITABS) 10 MG dissolvable tablet Take by mouth.    ? tacrolimus (PROTOPIC) 0.1 % ointment Apply 1 Dose topically as needed.    ? tretinoin (RETIN-A) 0.05 % cream Apply on the skin nightly; Use a few nights a week and increase to nightly as tolerated. 20 g 2  ? ?No current facility-administered medications for this visit.  ? ? ?Medication Side Effects: None ? ?Allergies: No Known Allergies ? ?Past Medical History:  ?Diagnosis Date  ? Crohn's disease ([place])   ? Depression   ? Eczema   ? Uveitis   ? ? ?Past Medical History, Surgical history, Social history, and Family history were reviewed and updated as appropriate.  ? ?Please see review of systems for further details on the patient's review from today.  ? ?Objective:  ? ?Physical Exam:  ?There were no vitals taken  for this visit. ? ?Physical Exam ?Constitutional:   ?   General: He is not in acute distress. ?Musculoskeletal:     ?   General: No deformity.  ?Neurological:  ?   Mental Status: He is alert and oriented to person, place, and time.  ?   Coordination: Coordination normal.  ?Psychiatric:     ?   Attention and Perception: Attention and perception normal. He does not perceive auditory or visual hallucinations.     ?   Mood and Affect: Mood normal. Mood is not anxious or depressed. Affect is not labile, blunt, angry or inappropriate.     ?   Speech: Speech normal.     ?    Behavior: Behavior normal.     ?   Thought Content: Thought content normal. Thought content is not paranoid or delusional. Thought content does not include homicidal or suicidal ideation. Thought content does not include homicidal or suicidal plan.     ?   Cognition and Memory: Cognition and memory normal.     ?   Judgment: Judgment normal.  ?   Comments: Insight intact  ? ? ?Lab Review:  ?   ?Component Value Date/Time  ? NA 138 09/10/2021 1448  ? K 4.1 09/10/2021 1448  ? CL 105 09/10/2021 1448  ? CO2 24 09/10/2021 1448  ? GLUCOSE 80 09/10/2021 1448  ? BUN 13 09/10/2021 1448  ? CREATININE 0.75 09/10/2021 1448  ? CALCIUM 9.8 09/10/2021 1448  ? PROT 7.4 09/10/2021 1448  ? ALBUMIN 3.0 (L) 11/24/2019 1315  ? AST 17 09/10/2021 1448  ? ALT 27 09/10/2021 1448  ? ALKPHOS 150 11/24/2019 1315  ? BILITOT 0.6 09/10/2021 1448  ? GFRNONAA NOT CALCULATED 11/26/2019 0649  ? GFRAA NOT CALCULATED 11/26/2019 0649  ? ? ?   ?Component Value Date/Time  ? WBC 7.9 09/10/2021 1448  ? RBC 4.74 09/10/2021 1448  ? HGB 14.8 09/10/2021 1448  ? HCT 43.6 09/10/2021 1448  ? PLT 339 09/10/2021 1448  ? MCV 92.0 09/10/2021 1448  ? MCH 31.2 09/10/2021 1448  ? MCHC 33.9 09/10/2021 1448  ? RDW 11.5 09/10/2021 1448  ? LYMPHSABS 3,160 09/10/2021 1448  ? MONOABS 0.6 11/27/2019 1549  ? EOSABS 253 09/10/2021 1448  ? BASOSABS 40 09/10/2021 1448  ? ? ?No results found for: POCLITH, LITHIUM  ? ?No results found for: PHENYTOIN, PHENOBARB, VALPROATE, CBMZ  ? ?.res ?Assessment: Plan:   ? ?Plan: ? ?Does not wish to take medications. ? ?Time spent with patient was 20 minutes. Greater than 50% of face to face time with patient was spent on counseling and coordination of care. ? ?RTC 3 months  ? ?Diagnoses and all orders for this visit: ? ?Major depressive disorder, recurrent episode, moderate ([place]) ? ?  ? ?Please see After Visit Summary for patient specific instructions. ? ?Future Appointments  ?Date Time Provider [place]  ?03/08/2022  4:00 PM Kandis Ban, MD [facility]  ? ? ?No orders of the defined types were placed in this encounter. ? ? ?------------------------------- ?

## 2022-01-12 ENCOUNTER — Other Ambulatory Visit (HOSPITAL_COMMUNITY): Payer: Self-pay

## 2022-01-13 ENCOUNTER — Other Ambulatory Visit (HOSPITAL_COMMUNITY): Payer: Self-pay

## 2022-01-13 DIAGNOSIS — J3089 Other allergic rhinitis: Secondary | ICD-10-CM | POA: Diagnosis not present

## 2022-01-14 ENCOUNTER — Other Ambulatory Visit (HOSPITAL_COMMUNITY): Payer: Self-pay

## 2022-01-18 ENCOUNTER — Other Ambulatory Visit (HOSPITAL_COMMUNITY): Payer: Self-pay

## 2022-01-18 DIAGNOSIS — J3089 Other allergic rhinitis: Secondary | ICD-10-CM | POA: Diagnosis not present

## 2022-01-19 ENCOUNTER — Other Ambulatory Visit (HOSPITAL_COMMUNITY): Payer: Self-pay

## 2022-01-22 ENCOUNTER — Other Ambulatory Visit (HOSPITAL_COMMUNITY): Payer: Self-pay

## 2022-01-25 DIAGNOSIS — J3089 Other allergic rhinitis: Secondary | ICD-10-CM | POA: Diagnosis not present

## 2022-02-01 DIAGNOSIS — J3089 Other allergic rhinitis: Secondary | ICD-10-CM | POA: Diagnosis not present

## 2022-02-04 ENCOUNTER — Telehealth: Payer: Self-pay | Admitting: Gastroenterology

## 2022-02-04 NOTE — Telephone Encounter (Signed)
Inbound call from patients mother, stating that patient is in need of labs and a TB test. Please advise.  ?

## 2022-02-04 NOTE — Telephone Encounter (Signed)
The pt's mother is calling to make a follow up appt with Dr Havery Moros per Jessica's recommendation at last office visit to discuss lab needed.  Appt made for 5/24 with Dr A.   ?

## 2022-02-08 DIAGNOSIS — J3089 Other allergic rhinitis: Secondary | ICD-10-CM | POA: Diagnosis not present

## 2022-02-10 ENCOUNTER — Ambulatory Visit (INDEPENDENT_AMBULATORY_CARE_PROVIDER_SITE_OTHER): Payer: 59 | Admitting: *Deleted

## 2022-02-10 DIAGNOSIS — Z111 Encounter for screening for respiratory tuberculosis: Secondary | ICD-10-CM | POA: Diagnosis not present

## 2022-02-10 DIAGNOSIS — Z23 Encounter for immunization: Secondary | ICD-10-CM

## 2022-02-10 DIAGNOSIS — H5213 Myopia, bilateral: Secondary | ICD-10-CM | POA: Diagnosis not present

## 2022-02-12 LAB — TB SKIN TEST
Induration: 0 mm
TB Skin Test: NEGATIVE

## 2022-02-15 ENCOUNTER — Other Ambulatory Visit (HOSPITAL_COMMUNITY): Payer: Self-pay

## 2022-02-15 DIAGNOSIS — J3089 Other allergic rhinitis: Secondary | ICD-10-CM | POA: Diagnosis not present

## 2022-02-18 ENCOUNTER — Other Ambulatory Visit (HOSPITAL_COMMUNITY): Payer: Self-pay

## 2022-02-18 DIAGNOSIS — L649 Androgenic alopecia, unspecified: Secondary | ICD-10-CM | POA: Diagnosis not present

## 2022-02-18 DIAGNOSIS — L2089 Other atopic dermatitis: Secondary | ICD-10-CM | POA: Diagnosis not present

## 2022-02-18 MED ORDER — TRIAMCINOLONE ACETONIDE 0.1 % EX CREA
TOPICAL_CREAM | CUTANEOUS | 2 refills | Status: DC
  Filled 2022-02-18 (×2): qty 454, 30d supply, fill #0
  Filled 2022-09-30: qty 454, 30d supply, fill #1

## 2022-02-18 MED ORDER — FINASTERIDE 1 MG PO TABS
ORAL_TABLET | ORAL | 3 refills | Status: DC
  Filled 2022-02-18: qty 90, 90d supply, fill #0
  Filled 2022-06-06: qty 90, 90d supply, fill #1
  Filled 2022-10-05: qty 90, 90d supply, fill #2
  Filled 2023-02-17: qty 90, 90d supply, fill #3

## 2022-02-19 ENCOUNTER — Other Ambulatory Visit (INDEPENDENT_AMBULATORY_CARE_PROVIDER_SITE_OTHER): Payer: Self-pay

## 2022-02-19 ENCOUNTER — Other Ambulatory Visit (HOSPITAL_COMMUNITY): Payer: Self-pay

## 2022-02-22 ENCOUNTER — Other Ambulatory Visit (HOSPITAL_COMMUNITY): Payer: Self-pay

## 2022-02-22 DIAGNOSIS — J3089 Other allergic rhinitis: Secondary | ICD-10-CM | POA: Diagnosis not present

## 2022-02-22 MED ORDER — DAPSONE 5 % EX GEL
CUTANEOUS | 2 refills | Status: AC
  Filled 2022-02-22: qty 90, 30d supply, fill #0

## 2022-03-08 ENCOUNTER — Ambulatory Visit (INDEPENDENT_AMBULATORY_CARE_PROVIDER_SITE_OTHER): Payer: 59 | Admitting: Pediatric Gastroenterology

## 2022-03-09 ENCOUNTER — Other Ambulatory Visit (HOSPITAL_COMMUNITY): Payer: Self-pay

## 2022-03-11 ENCOUNTER — Other Ambulatory Visit (HOSPITAL_COMMUNITY): Payer: Self-pay

## 2022-03-15 ENCOUNTER — Other Ambulatory Visit (HOSPITAL_COMMUNITY): Payer: Self-pay

## 2022-03-17 ENCOUNTER — Encounter: Payer: Self-pay | Admitting: Gastroenterology

## 2022-03-17 ENCOUNTER — Other Ambulatory Visit (HOSPITAL_COMMUNITY): Payer: Self-pay

## 2022-03-17 ENCOUNTER — Other Ambulatory Visit: Payer: Self-pay | Admitting: Pharmacist

## 2022-03-17 ENCOUNTER — Other Ambulatory Visit (INDEPENDENT_AMBULATORY_CARE_PROVIDER_SITE_OTHER): Payer: 59

## 2022-03-17 ENCOUNTER — Ambulatory Visit (INDEPENDENT_AMBULATORY_CARE_PROVIDER_SITE_OTHER): Payer: 59 | Admitting: Gastroenterology

## 2022-03-17 VITALS — BP 128/84 | HR 70 | Ht 68.5 in | Wt 222.0 lb

## 2022-03-17 DIAGNOSIS — Z79899 Other long term (current) drug therapy: Secondary | ICD-10-CM

## 2022-03-17 DIAGNOSIS — K5010 Crohn's disease of large intestine without complications: Secondary | ICD-10-CM

## 2022-03-17 DIAGNOSIS — Z23 Encounter for immunization: Secondary | ICD-10-CM

## 2022-03-17 LAB — COMPREHENSIVE METABOLIC PANEL
ALT: 32 U/L (ref 0–53)
AST: 33 U/L (ref 0–37)
Albumin: 4.5 g/dL (ref 3.5–5.2)
Alkaline Phosphatase: 74 U/L (ref 52–171)
BUN: 10 mg/dL (ref 6–23)
CO2: 24 meq/L (ref 19–32)
Calcium: 9.9 mg/dL (ref 8.4–10.5)
Chloride: 102 meq/L (ref 96–112)
Creatinine, Ser: 0.84 mg/dL (ref 0.40–1.50)
GFR: 126.09 mL/min (ref >60.00)
Glucose, Bld: 80 mg/dL (ref 70–99)
Potassium: 3.9 meq/L (ref 3.5–5.1)
Sodium: 138 meq/L (ref 135–145)
Total Bilirubin: 0.6 mg/dL (ref 0.2–1.2)
Total Protein: 7.9 g/dL (ref 6.0–8.3)

## 2022-03-17 LAB — CBC WITH DIFFERENTIAL/PLATELET
Basophils Absolute: 0.1 10*3/uL (ref 0.0–0.1)
Basophils Relative: 0.7 % (ref 0.0–3.0)
Eosinophils Absolute: 0.2 10*3/uL (ref 0.0–0.7)
Eosinophils Relative: 2.5 % (ref 0.0–5.0)
HCT: 42.7 % (ref 36.0–49.0)
Hemoglobin: 14.2 g/dL (ref 12.0–16.0)
Lymphocytes Relative: 33.4 % (ref 24.0–48.0)
Lymphs Abs: 3.1 10*3/uL (ref 0.7–4.0)
MCHC: 33.2 g/dL (ref 31.0–37.0)
MCV: 91.9 fl (ref 78.0–98.0)
Monocytes Absolute: 0.7 10*3/uL (ref 0.1–1.0)
Monocytes Relative: 7.2 % (ref 3.0–12.0)
Neutro Abs: 5.2 10*3/uL (ref 1.4–7.7)
Neutrophils Relative %: 56.2 % (ref 43.0–71.0)
Platelets: 391 10*3/uL (ref 150.0–575.0)
RBC: 4.65 Mil/uL (ref 3.80–5.70)
RDW: 12.7 % (ref 11.4–15.5)
WBC: 9.2 10*3/uL (ref 4.5–13.5)

## 2022-03-17 LAB — VITAMIN D 25 HYDROXY (VIT D DEFICIENCY, FRACTURES): VITD: 35.12 ng/mL (ref 30.00–100.00)

## 2022-03-17 LAB — VITAMIN B12: Vitamin B-12: 715 pg/mL (ref 211–911)

## 2022-03-17 MED ORDER — HUMIRA PEN 40 MG/0.8ML ~~LOC~~ PNKT
PEN_INJECTOR | SUBCUTANEOUS | 1 refills | Status: DC
  Filled 2022-03-17: qty 6, fill #0
  Filled 2022-04-06: qty 2, 28d supply, fill #0
  Filled 2022-04-30: qty 2, 28d supply, fill #1

## 2022-03-17 MED ORDER — HUMIRA PEN 40 MG/0.8ML ~~LOC~~ PNKT
PEN_INJECTOR | SUBCUTANEOUS | 1 refills | Status: DC
  Filled 2022-03-17: qty 6, fill #0

## 2022-03-17 MED ORDER — SUTAB 1479-225-188 MG PO TABS
1.0000 | ORAL_TABLET | Freq: Once | ORAL | 0 refills | Status: AC
  Filled 2022-03-17: qty 24, 1d supply, fill #0

## 2022-03-17 NOTE — Progress Notes (Signed)
HPI :  21 year old male here for a follow-up visit for ileocolonic Crohn's.  This is my first time seeing him although he established with our clinic [facility] in February.  Recall he carries a diagnosis of Crohn's colitis when he presented in April 2021 with severe diarrhea.  Previously followed by Dr. Yehuda Savannah at [facility]  Initially treated with methotrexate and folic acid, colonoscopy June 2022 showed active disease.  He was started on Humira at that time, follow-up fecal calprotectin was improved.  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  He is accompanied by his mother in the office today.  He has been on Humira now for most a year.  He states it is working really well for him.  He takes 40 mg every other week.  He tolerates the drug okay, no side effects.  He denies any abdominal pains that bother him.  He states his stools are regular.  He has not had any flares of this since being on the drug.  Of note he had a history of uveitis and LFT elevation while on methotrexate.  He denies any problems with his eyes since being on Humira.  He denies any routine NSAID use.  He is the first 1 in his family to be diagnosed with Crohn's disease.  He does not smoke cigarettes.  He does recall early in his diagnosis he was diagnosed and treated for C. difficile on 2 occasions.  He recently had a tuberculosis test that was negative.  He has never had a pneumococcal vaccine.  He gets his flu shot yearly and COVID-vaccine up-to-date.  Endoscopic history:  EGD 03/2021: - Normal esophagus.  - Normal stomach. Biopsied.  - Normal examined duodenum. Biopsied.   - Normal esophagus. Biopsied.    Colonoscopy 03/2021: The perianal and digital rectal examinations were normal.  The terminal ileum appeared normal. Estimated blood loss was minimal.  Biopsies were taken with a cold forceps for histology.  Inflammation characterized by multiple shallow ulcerations was found as  patches in the transverse colon and in the ascending colon. The anus, the rectum, the recto-sigmoid colon, the sigmoid colon, the descending colon, the splenic flexure and the distal transverse colon were spared. The inflammation was moderate in severity. Biopsies were taken with a cold forceps for histology. Estimated blood loss was minimal.    Diagnosis     A: Stomach, biopsy - Gastric mucosa with no diagnostic alterations including no Helicobacter pylori organisms seen   B: Small bowel, duodenum, biopsy - Duodenal mucosa with no diagnostic alterations including no evidence of celiac disease   C: Esophagus, biopsy - Fragments of esophageal squamous epithelium with no diagnostic alterations including no increased eosinophil infiltrate   D: Colon, right, biopsy - Chronic active colitis with mild to moderate activity, consistent with the patient's clinical history of Crohn's disease - No evidence of dysplasia   E: Colon, transverse, biopsy - Chronic active colitis with moderate activity and granulomatous formation, consistent with the patient's clinical history of Crohn's disease - No evidence of dysplasia   F: Colon, left, biopsy - Chronic colitis with focal mild activity and granulomatous formation, consistent with the patient's clinical history of Crohn's disease - No evidence of dysplasia   G: Small bowel, terminal ileum, biopsy - Small bowel mucosa with prominent lymphoid aggregates, consistent with ileal mucosa with no diagnostic alterations    MR enterography of the abdomen and pelvis in May 2021 as follows:  IMPRESSION: 1. Small cystic hepatic foci not present in 2019 question of subtle peripheral enhancement and edema in the hepatic parenchyma. Small micro abscesses are considered perhaps related to portal dissemination in the setting of recent GI infection. Would suggest follow-up to ensure resolution assuming there has been some therapy in the interval. Again these were  not seen in 2019. 2. Mild restricted diffusion and minimal hyperenhancement associated with the RIGHT colon. Appearance grossly similar to the prior study. No small bowel abnormalities are demonstrated to suggest enteritis though there is mild fecalization of the distal ileum which is nonspecific. Study mildly limited by field of view on today's examination. No signs of obstruction, fistula or abscess. Perianal region not evaluated. 3. Stool fills much of the colon limiting assessment.     EGD 01/2020:    - Localized nodularity of the distal esophagus,        otherwise normal esophagus. Biopsied.      - Nodular mucosa in the gastric antrum. Biopsied.      - Normal examined duodenum. Biopsied.    Colonoscopy 01/2020: - Congested, erythematous areas of colonic mucosa with apthae. Biopsied.  - The examined portion of the ileum was normal.  Biopsied.    A: Stomach, biopsy - Chronic superficial active gastritis with rare intramucosal granulomas compatible with involvement by patient's known history of inflammatory bowel disease - Reactive foveolar hyperplasia - An immunohistochemical stain for H. Pylori has been ordered and the results will be issued in an addendum - No evidence of malignancy, dysplasia, or metaplasia   B: Esophagus, biopsy - Superficial strips of benign squamous mucosa - No evidence of dysplasia or metaplasia   C: Small bowel, duodenum, biopsy - Acute and chronic duodenitis compatible with involvement by patient's known history of inflammatory bowel disease - No evidence of malignancy or dysplasia   D: Colon, right, biopsy - Focally active chronic colitis consistent with involvement by patient's known history of inflammatory bowel disease - No evidence of malignancy, dysplasia, granulomas, or viral cytopathic effect   E: Colon, transverse, biopsy - Focally active chronic colitis consistent with involvement by patient's known history of inflammatory bowel disease -  No evidence of malignancy, dysplasia, granulomas, or viral cytopathic effect   F: Colon, left, biopsy - Features of chronic mucosal injury - No evidence of active colitis in this specimen - No evidence of malignancy, dysplasia, granulomas, or viral cytopathic effect   G: Small bowel, terminal ileum, biopsy - Focally active chronic ileitis compatible with involvement by patient's known history of inflammatory bowel disease - No evidence of malignancy, dysplasia, granulomas, or viral cytopathic effect    TB skin test 02/03/22 - negative   Past Medical History:  Diagnosis Date   Crohn's disease ([place])    Depression    Eczema    Uveitis      Past Surgical History:  Procedure Laterality Date   TURBINATE REDUCTION Bilateral 03/10/2021   Procedure: BILATERAL TURBINATE REDUCTION;  Surgeon: Leta Baptist, MD;  Location: [facility];  Service: ENT;  Laterality: Bilateral;   Family History  Problem Relation Age of Onset   Hypertension Mother    Diabetes Mother    Hypertension Father    Hypertension Maternal Grandmother    Prostate cancer Maternal Grandfather    Hypertension Maternal Grandfather    Hyperlipidemia Maternal Grandfather    Glaucoma Maternal Grandfather    Diabetes Maternal Uncle    Social History   Tobacco Use   Smoking status: Never  Smokeless tobacco: Never  Vaping Use   Vaping Use: Never used  Substance Use Topics   Alcohol use: Never   Drug use: Never   Current Outpatient Medications  Medication Sig Dispense Refill   Adalimumab (HUMIRA PEN) 40 MG/0.8ML PNKT Inject 1 pen (40 mg/0.8 mL) into skin every 14 days. 6 each 1   Cholecalciferol 125 MCG (5000 UT) capsule Take by mouth.     clindamycin-benzoyl peroxide (BENZACLIN) gel Apply 1 application topically daily.  3   Dapsone 5 % topical gel APPLY 1 APPLICATION ON THE SKIN IN THE MORNING 60 g 2   EPINEPHrine 0.3 mg/0.3 mL IJ SOAJ injection Inject into the muscle as directed.     finasteride  (PROPECIA) 1 MG tablet TAKE 1 TABLET BY MOUTH EVERY DAY 90 tablet 3   fluticasone (FLONASE) 50 MCG/ACT nasal spray Use 2 sprays into both nostrils daily 16 g 5   loratadine (CLARITIN REDITABS) 10 MG dissolvable tablet Take by mouth.     tacrolimus (PROTOPIC) 0.1 % ointment Apply 1 Dose topically as needed.     tretinoin (RETIN-A) 0.05 % cream Apply on the skin nightly; Use a few nights a week and increase to nightly as tolerated. 20 g 2   triamcinolone cream (KENALOG) 0.1 % Apply 1 gram to skin twice a day 908 g 2   No current facility-administered medications for this visit.   No Known Allergies   Review of Systems: All systems reviewed and negative except where noted in HPI.    Physical Exam: BP 128/84   Pulse 70   Ht 5' 8.5" (1.74 m)   Wt 222 lb (100.7 kg)   BMI 33.26 kg/m  Constitutional: Pleasant,well-developed, male in no acute distress. Neurological: Alert and oriented to person place and time. Psychiatric: Normal mood and affect. Behavior is normal.   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  We had a good conversation about Crohn's disease, what it is, etiology, prognosis, potential complications of Crohn's, treatment options.  Failed methotrexate in the past, now on Humira 40 mg every other week and has been doing really well without any clinical flares the past year.  Recommend he continue Humira at present dosing given he is doing well with it.  Discussed risks of immunogenicity and will need to be followed time for loss of response.  He is due for basic labs today, will send him to the lab, we will also check vitamin D level and B12 level.  Now that he has been on Humira for a year recommend follow-up surveillance of some sort to assess for active inflammation, we discussed if you want trend  fecal calprotectin.  Following discussion of options he wants to proceed with colonoscopy.  This will be scheduled in the [facility].  I am recommending a pneumococcal vaccine today.  I would like to see him every 6 months, or sooner if flares of his Crohn's.  He agreed, all questions answered.  Plan: - continue Humira, refilled - lab for CBC, CMET, vitamin D, B12 - schedule colonoscopy in the [facility] - pneumo vacc today - PCV13 given - follow up in 6 months  Jolly Mango, MD [facility]

## 2022-03-17 NOTE — Patient Instructions (Addendum)
If you are age 20 or older, your body mass index should be between 23-30. Your Body mass index is 33.26 kg/m. If this is out of the aforementioned range listed, please consider follow up with your Primary Care Provider.  If you are age 4 or younger, your body mass index should be between 19-25. Your Body mass index is 33.26 kg/m. If this is out of the aformentioned range listed, please consider follow up with your Primary Care Provider.   ________________________________________________________  The [facility] providers would like to encourage you to use [facility] to communicate with providers for non-urgent requests or questions.  Due to long hold times on the telephone, sending your provider a message by [facility] may be a faster and more efficient way to get a response.  Please allow 48 business hours for a response.  Please remember that this is for non-urgent requests.  _______________________________________________________ We are giving you the Pneumococcal 13-valent Conjugate vaccine today.   Please go to the lab in the basement of our building to have lab work done as you leave today. Hit "B" for basement when you get on the elevator.  When the doors open the lab is on your left.  We will call you with the results. Thank you.  You have been scheduled for a colonoscopy. Please follow written instructions given to you at your visit today.  Please pick up your prep supplies at the pharmacy within the next 1-3 days. If you use inhalers (even only as needed), please bring them with you on the day of your procedure.  We have sent the following medications to your pharmacy for you to pick up at your convenience: Humira  Please follow up in 6 months (Nov. 2023).  Thank you for entrusting me with your care and for choosing [facility], Dr. Poplar Cellar

## 2022-04-02 ENCOUNTER — Encounter: Payer: Self-pay | Admitting: Gastroenterology

## 2022-04-06 ENCOUNTER — Other Ambulatory Visit (HOSPITAL_COMMUNITY): Payer: Self-pay

## 2022-04-06 ENCOUNTER — Ambulatory Visit: Payer: 59 | Admitting: Internal Medicine

## 2022-04-06 DIAGNOSIS — H9313 Tinnitus, bilateral: Secondary | ICD-10-CM | POA: Diagnosis not present

## 2022-04-06 DIAGNOSIS — J310 Chronic rhinitis: Secondary | ICD-10-CM | POA: Diagnosis not present

## 2022-04-06 DIAGNOSIS — J343 Hypertrophy of nasal turbinates: Secondary | ICD-10-CM | POA: Diagnosis not present

## 2022-04-08 ENCOUNTER — Other Ambulatory Visit (HOSPITAL_COMMUNITY): Payer: Self-pay

## 2022-04-08 DIAGNOSIS — J3089 Other allergic rhinitis: Secondary | ICD-10-CM | POA: Diagnosis not present

## 2022-04-08 DIAGNOSIS — J301 Allergic rhinitis due to pollen: Secondary | ICD-10-CM | POA: Diagnosis not present

## 2022-04-08 DIAGNOSIS — J3081 Allergic rhinitis due to animal (cat) (dog) hair and dander: Secondary | ICD-10-CM | POA: Diagnosis not present

## 2022-04-09 ENCOUNTER — Ambulatory Visit (AMBULATORY_SURGERY_CENTER): Payer: 59 | Admitting: Gastroenterology

## 2022-04-09 ENCOUNTER — Encounter: Payer: Self-pay | Admitting: Gastroenterology

## 2022-04-09 VITALS — BP 108/48 | HR 67 | Temp 98.9°F | Resp 14 | Ht 68.0 in | Wt 222.0 lb

## 2022-04-09 DIAGNOSIS — K529 Noninfective gastroenteritis and colitis, unspecified: Secondary | ICD-10-CM | POA: Diagnosis not present

## 2022-04-09 DIAGNOSIS — K5010 Crohn's disease of large intestine without complications: Secondary | ICD-10-CM | POA: Diagnosis present

## 2022-04-09 DIAGNOSIS — K648 Other hemorrhoids: Secondary | ICD-10-CM | POA: Diagnosis not present

## 2022-04-09 MED ORDER — SODIUM CHLORIDE 0.9 % IV SOLN: 500.0000 mL | Freq: Once | INTRAVENOUS | Status: DC

## 2022-04-09 NOTE — Op Note (Signed)
[facility] Patient Name: Sharrieff Spratlin Procedure Date: 04/09/2022 3:16 PM MRN: [id number] Endoscopist: Remo Lipps P. Havery Moros , MD Age: 21 Referring MD:  Date of Birth: 12-Nov-2000 Gender: Male Account #: [id number] Procedure:                Colonoscopy Indications:              Disease activity assessment of Crohn's disease of                            the small bowel and colon - on Humira monotherapy                            and clinically feeling well, labs normal Medicines:                Monitored Anesthesia Care Procedure:                Pre-Anesthesia Assessment:                           - Prior to the procedure, a History and Physical                            was performed, and patient medications and                            allergies were reviewed. The patient's tolerance of                            previous anesthesia was also reviewed. The risks                            and benefits of the procedure and the sedation                            options and risks were discussed with the patient.                            All questions were answered, and informed consent                            was obtained. Prior Anticoagulants: The patient has                            taken no previous anticoagulant or antiplatelet                            agents. ASA Grade Assessment: II - A patient with                            mild systemic disease. After reviewing the risks                            and benefits, the patient was deemed in  satisfactory condition to undergo the procedure.                           After obtaining informed consent, the colonoscope                            was passed under direct vision. Throughout the                            procedure, the patient's blood pressure, pulse, and                            oxygen saturations were monitored continuously. The                            CF [id number] #[id number]  was introduced through the anus                            and advanced to the the terminal ileum, with                            identification of the appendiceal orifice and IC                            valve. The colonoscopy was performed without                            difficulty. The patient tolerated the procedure                            well. The quality of the bowel preparation was                            good. The terminal ileum, ileocecal valve,                            appendiceal orifice, and rectum were photographed. Scope In: 3:35:21 PM Scope Out: 3:50:37 PM Scope Withdrawal Time: 0 hours 12 minutes 33 seconds  Total Procedure Duration: 0 hours 15 minutes 16 seconds  Findings:                 The perianal and digital rectal examinations were                            normal.                           The terminal ileum appeared normal.                           Patchy very faint inflammatory characterized by                            granularity and loss of vascularity was found in  the sigmoid colon, in the transverse colon and in                            the ascending colon. No ulcerations or erosions                            noted.                           Internal hemorrhoids were found during                            retroflexion. The hemorrhoids were small.                           The exam was otherwise without abnormality.                           Biopsies were taken with a cold forceps for                            histology from right, transverse, and left colon. Complications:            No immediate complications. Estimated blood loss:                            Minimal. Estimated Blood Loss:     Estimated blood loss was minimal. Impression:               - The examined portion of the ileum was normal.                           - Suspected faint / mild inflammatory changes as                            decribed  - very mild, no erosions / ulcerations                           - Internal hemorrhoids.                           - The examination was otherwise normal.                           - Biopsies were taken with a cold forceps for                            histology. Recommendation:           - Patient has a contact number available for                            emergencies. The signs and symptoms of potential                            delayed complications were discussed with the  patient. Return to normal activities tomorrow.                            Written discharge instructions were provided to the                            patient.                           - Resume previous diet.                           - Continue present medications.                           - Await pathology results.                           - Consideration for checking Humira trough levels                            and antibody levels - will discuss with the patient Carlota Raspberry. Jasani Lengel, MD 04/09/2022 3:55:40 PM This report has been signed electronically.

## 2022-04-09 NOTE — Progress Notes (Signed)
Pt's states no medical or surgical changes since previsit or office visit. 

## 2022-04-09 NOTE — Progress Notes (Signed)
Sedate, gd SR, tolerated procedure well, VSS, report to RN 

## 2022-04-09 NOTE — Progress Notes (Signed)
Called to room to assist during endoscopic procedure.  Patient ID and intended procedure confirmed with present staff. Received instructions for my participation in the procedure from the performing physician.  

## 2022-04-09 NOTE — Progress Notes (Signed)
History and Physical Interval Note: See clinic note 03/17/22 - no interval changes. Colonoscopy to evaluate Crohn's disease now that he is on Humira, assess for mucosal healing. Patient feels well without complaints. He understands the procedure and wants to proceed.   04/09/2022 3:32 PM  Billy Allen  has presented today for endoscopic procedure(s), with the diagnosis of  Encounter Diagnosis  Name Primary?   Crohn's disease of large intestine without complication ([place]) Yes  .  The various methods of evaluation and treatment have been discussed with the patient and/or family. After consideration of risks, benefits and other options for treatment, the patient has consented to  the endoscopic procedure(s).   The patient's history has been reviewed, patient examined, no change in status, stable for surgery.  I have reviewed the patient's chart and labs.  Questions were answered to the patient's satisfaction.    Jolly Mango, MD [facility]

## 2022-04-09 NOTE — Patient Instructions (Signed)
   HANDOUT ON HEMORRHOIDS GIVEN.   YOU HAD AN ENDOSCOPIC PROCEDURE TODAY AT [facility]:   Refer to the procedure report that was given to you for any specific questions about what was found during the examination.  If the procedure report does not answer your questions, please call your gastroenterologist to clarify.  If you requested that your care partner not be given the details of your procedure findings, then the procedure report has been included in a sealed envelope for you to review at your convenience later.  YOU SHOULD EXPECT: Some feelings of bloating in the abdomen. Passage of more gas than usual.  Walking can help get rid of the air that was put into your GI tract during the procedure and reduce the bloating. If you had a lower endoscopy (such as a colonoscopy or flexible sigmoidoscopy) you may notice spotting of blood in your stool or on the toilet paper. If you underwent a bowel prep for your procedure, you may not have a normal bowel movement for a few days.  Please Note:  You might notice some irritation and congestion in your nose or some drainage.  This is from the oxygen used during your procedure.  There is no need for concern and it should clear up in a day or so.  SYMPTOMS TO REPORT IMMEDIATELY:  Following lower endoscopy (colonoscopy or flexible sigmoidoscopy):  Excessive amounts of blood in the stool  Significant tenderness or worsening of abdominal pains  Swelling of the abdomen that is new, acute  Fever of 100F or higher   For urgent or emergent issues, a gastroenterologist can be reached at any hour by calling [phone]. Do not use MyChart messaging for urgent concerns.    DIET:  We do recommend a small meal at first, but then you may proceed to your regular diet.  Drink plenty of fluids but you should avoid alcoholic beverages for 24 hours.  ACTIVITY:  You should plan to take it easy for the rest of today and you should NOT DRIVE or use  heavy machinery until tomorrow (because of the sedation medicines used during the test).    FOLLOW UP: Our staff will call the number listed on your records 24-72 hours following your procedure to check on you and address any questions or concerns that you may have regarding the information given to you following your procedure. If we do not reach you, we will leave a message.  We will attempt to reach you two times.  During this call, we will ask if you have developed any symptoms of COVID 19. If you develop any symptoms (ie: fever, flu-like symptoms, shortness of breath, cough etc.) before then, please call [phone].  If you test positive for Covid 19 in the 2 weeks post procedure, please call and report this information to [place].    If any biopsies were taken you will be contacted by phone or by letter within the next 1-3 weeks.  Please call us at [phone] if you have not heard about the biopsies in 3 weeks.    SIGNATURES/CONFIDENTIALITY: You and/or your care partner have signed paperwork which will be entered into your electronic medical record.  These signatures attest to the fact that that the information above on your After Visit Summary has been reviewed and is understood.  Full responsibility of the confidentiality of this discharge information lies with you and/or your care-partner.

## 2022-04-12 ENCOUNTER — Telehealth: Payer: Self-pay

## 2022-04-12 NOTE — Telephone Encounter (Signed)
  Follow up Call-     04/09/2022    3:14 PM  Call back number  Post procedure Call Back phone  # [phone]  Permission to leave phone message Yes     Patient questions:  Do you have a fever, pain , or abdominal swelling? No. Pain Score  0 *  Have you tolerated food without any problems? Yes.    Have you been able to return to your normal activities? Yes.    Do you have any questions about your discharge instructions: Diet   No. Medications  No. Follow up visit  No.  Do you have questions or concerns about your Care? No.  Actions: * If pain score is 4 or above: No action needed, pain <4.

## 2022-04-16 ENCOUNTER — Other Ambulatory Visit: Payer: Self-pay

## 2022-04-16 DIAGNOSIS — Z79899 Other long term (current) drug therapy: Secondary | ICD-10-CM

## 2022-04-16 DIAGNOSIS — K5010 Crohn's disease of large intestine without complications: Secondary | ICD-10-CM

## 2022-04-16 DIAGNOSIS — J3089 Other allergic rhinitis: Secondary | ICD-10-CM | POA: Diagnosis not present

## 2022-04-16 DIAGNOSIS — J3081 Allergic rhinitis due to animal (cat) (dog) hair and dander: Secondary | ICD-10-CM | POA: Diagnosis not present

## 2022-04-16 DIAGNOSIS — J301 Allergic rhinitis due to pollen: Secondary | ICD-10-CM | POA: Diagnosis not present

## 2022-04-22 DIAGNOSIS — J3089 Other allergic rhinitis: Secondary | ICD-10-CM | POA: Diagnosis not present

## 2022-04-22 DIAGNOSIS — J3081 Allergic rhinitis due to animal (cat) (dog) hair and dander: Secondary | ICD-10-CM | POA: Diagnosis not present

## 2022-04-22 DIAGNOSIS — J301 Allergic rhinitis due to pollen: Secondary | ICD-10-CM | POA: Diagnosis not present

## 2022-04-26 ENCOUNTER — Ambulatory Visit: Payer: 59 | Admitting: Internal Medicine

## 2022-04-26 ENCOUNTER — Encounter: Payer: Self-pay | Admitting: Internal Medicine

## 2022-04-26 VITALS — BP 106/68 | HR 89 | Temp 98.5°F | Ht 68.0 in | Wt 210.0 lb

## 2022-04-26 DIAGNOSIS — N62 Hypertrophy of breast: Secondary | ICD-10-CM

## 2022-04-26 DIAGNOSIS — R6882 Decreased libido: Secondary | ICD-10-CM | POA: Diagnosis not present

## 2022-04-26 NOTE — Progress Notes (Signed)
Subjective:  Patient ID: Billy Allen, male    DOB: [birth year]  Age: 21 y.o. MRN: [id number]  CC: Follow-up   HPI Billy Allen presents for f/up -   He continues to be concerned about gynecomastia and wants to do some labs to evaluate for secondary causes.  He has intentionally lost weight but complains of lack of motivation, erectile dysfunction, and low libido.  Outpatient Medications Prior to Visit  Medication Sig Dispense Refill   Adalimumab (HUMIRA PEN) 40 MG/0.8ML PNKT Inject 1 pen (40 mg/0.8 mL) into skin every 14 days. 6 each 1   Cholecalciferol 125 MCG (5000 UT) capsule Take by mouth.     clindamycin-benzoyl peroxide (BENZACLIN) gel Apply 1 application topically daily.  3   Dapsone 5 % topical gel APPLY 1 APPLICATION ON THE SKIN IN THE MORNING 60 g 2   EPINEPHrine 0.3 mg/0.3 mL IJ SOAJ injection Inject into the muscle as directed.     finasteride (PROPECIA) 1 MG tablet TAKE 1 TABLET BY MOUTH EVERY DAY 90 tablet 3   fluticasone (FLONASE) 50 MCG/ACT nasal spray Use 2 sprays into both nostrils daily 16 g 5   loratadine (CLARITIN REDITABS) 10 MG dissolvable tablet Take by mouth.     tacrolimus (PROTOPIC) 0.1 % ointment Apply 1 Dose topically as needed.     tretinoin (RETIN-A) 0.05 % cream Apply on the skin nightly; Use a few nights a week and increase to nightly as tolerated. 20 g 2   triamcinolone cream (KENALOG) 0.1 % Apply 1 gram to skin twice a day 908 g 2   No facility-administered medications prior to visit.    ROS Review of Systems  Constitutional: Negative.   HENT: Negative.    Eyes: Negative.   Respiratory:  Negative for cough, chest tightness, shortness of breath and wheezing.   Cardiovascular:  Negative for chest pain, palpitations and leg swelling.  Gastrointestinal:  Negative for abdominal pain, diarrhea, nausea and vomiting.  Endocrine: Negative.   Genitourinary: Negative.  Negative for difficulty urinating.  Musculoskeletal: Negative.   Skin: Negative.   Negative for color change.  Neurological:  Negative for dizziness, weakness, light-headedness and headaches.  Hematological:  Negative for adenopathy. Does not bruise/bleed easily.  Psychiatric/Behavioral: Negative.      Objective:  BP 106/68 (BP Location: Left Arm, Patient Position: Sitting, Cuff Size: Large)   Pulse 89   Temp 98.5 F (36.9 C) (Oral)   Ht 5' 8"  (1.727 m)   Wt 210 lb (95.3 kg)   SpO2 96%   BMI 31.93 kg/m   BP Readings from Last 3 Encounters:  04/26/22 106/68  04/09/22 (!) 108/48  03/17/22 128/84    Wt Readings from Last 3 Encounters:  04/26/22 210 lb (95.3 kg) (95 %, Z= 1.62)*  04/09/22 222 lb (100.7 kg) (97 %, Z= 1.88)*  03/17/22 222 lb (100.7 kg) (97 %, Z= 1.89)*   * Growth percentiles are based on CDC (Boys, 2-20 Years) data.    Physical Exam Vitals reviewed.  HENT:     Nose: Nose normal.     Mouth/Throat:     Mouth: Mucous membranes are moist.  Eyes:     General: No scleral icterus.    Conjunctiva/sclera: Conjunctivae normal.  Cardiovascular:     Rate and Rhythm: Normal rate and regular rhythm.     Heart sounds: No murmur heard. Pulmonary:     Effort: Pulmonary effort is normal.     Breath sounds: No stridor. No wheezing, rhonchi  or rales.  Chest:  Breasts:    Breasts are symmetrical.     Right: No swelling, bleeding, inverted nipple, mass, nipple discharge, skin change or tenderness.     Left: No swelling, bleeding, inverted nipple, mass, nipple discharge, skin change or tenderness.     Comments: Mild bilateral gynecomastia with no mass, tenderness to palpation, or skin changes. Abdominal:     General: Abdomen is flat.     Palpations: There is no mass.     Tenderness: There is no abdominal tenderness. There is no guarding.     Hernia: No hernia is present.  Musculoskeletal:        General: Normal range of motion.     Cervical back: Neck supple.     Right lower leg: No edema.     Left lower leg: No edema.  Lymphadenopathy:      Cervical: No cervical adenopathy.     Upper Body:     Right upper body: No supraclavicular, axillary or pectoral adenopathy.     Left upper body: No supraclavicular, axillary or pectoral adenopathy.  Skin:    General: Skin is warm and dry.  Neurological:     General: No focal deficit present.     Mental Status: He is alert.  Psychiatric:        Mood and Affect: Mood normal.        Behavior: Behavior normal.     Lab Results  Component Value Date   WBC 9.2 03/17/2022   HGB 14.2 03/17/2022   HCT 42.7 03/17/2022   PLT 391.0 03/17/2022   GLUCOSE 80 03/17/2022   ALT 32 03/17/2022   AST 33 03/17/2022   NA 138 03/17/2022   K 3.9 03/17/2022   CL 102 03/17/2022   CREATININE 0.84 03/17/2022   BUN 10 03/17/2022   CO2 24 03/17/2022   INR 1.2 11/24/2019    No results found.  Assessment & Plan:   [name] was seen today for follow-up.  Diagnoses and all orders for this visit:  Gynecomastia, male- I will evaluate for secondary causes. -     Estradiol; Future -     Prolactin; Future -     Testosterone Total,Free,Bio, Males; Future -     Testosterone Total,Free,Bio, Males -     Prolactin -     Estradiol  Low libido- I will evaluate for secondary causes. -     Estradiol; Future -     Prolactin; Future -     Testosterone Total,Free,Bio, Males; Future -     Testosterone Total,Free,Bio, Males -     Prolactin -     Estradiol   I am having Estella Husk maintain his clindamycin-benzoyl peroxide, loratadine, EPINEPHrine, tretinoin, Cholecalciferol, fluticasone, tacrolimus, finasteride, triamcinolone cream, Dapsone, and Humira Pen.  No orders of the defined types were placed in this encounter.    Follow-up: Return in about 3 months (around 07/27/2022).  Scarlette Calico, MD

## 2022-04-26 NOTE — Patient Instructions (Addendum)
Gynecomastia, Adult Gynecomastia is an overgrowth of gland tissue in a man's breasts. This may cause one or both breasts to become enlarged. The condition often develops in men who have an imbalance of the male sex hormone (testosterone) and the male sex hormone (estrogen). This means that a man may have too much estrogen, too little testosterone, or both. Gynecomastia may be a normal part of aging for some men. It can also happen to adolescent boys during puberty. What are the causes? This condition may be caused by: Certain medicines, such as: Estrogen supplements and medicines that act like estrogen in the body. Medicines that keep testosterone from functioning normally in the body (testosterone-inhibiting drugs). Anabolic steroids. Medicines to treat heartburn, cancer, heart disease, mental health problems, HIV, or AIDS. Antibiotic medicine. Chemotherapy medicine. Recreational drugs, including alcohol, marijuana, and opioids. Herbal products, including lavender and tea tree oil. A gene that is passed from parent to child (inherited). Certain medical conditions, such as: Tumors in the pituitary or adrenal gland. An overactive thyroid gland. Certain inherited disorders, including a genetic disease that causes low testosterone in males (Klinefelter syndrome). Cancer of the lung, kidney, liver, testicle, or gastrointestinal tract. Conditions that cause liver or kidney failure. Poor nutrition and starvation. Testicle shrinking or failure (testicularatrophy). In some cases, the cause may not be known. What increases the risk? The following factors may make you more likely to develop this condition: Being 64 years old or older. Being overweight. Abusing alcohol or other drugs. Having a family history of gynecomastia. What are the signs or symptoms? In most cases, breast enlargement is the only symptom. The enlargement may start near the nipple, and the breast tissue may feel firm and  rubbery. Other symptoms may include: Pain or tenderness in the breasts. Itchy breasts. How is this diagnosed? This condition may be diagnosed based on: Your symptoms and medical history. A physical exam. Imaging tests, such as: An ultrasound. A mammogram. An MRI. Blood tests. Removal of a sample of breast tissue to be tested in a lab (biopsy). How is this treated? This condition may go away on its own, without treatment. If gynecomastia is caused by a medical problem or drug abuse, treatment may include: Getting treatment for the underlying medical problem or for drug abuse. Changing or stopping medicines. Medicines to block the effects of estrogen. Taking a testosterone replacement. Surgery to remove breast tissue or any lumps in your breasts. Breast reduction surgery. This may be an option if you have severe or painful gynecomastia. Follow these instructions at home:  Take over-the-counter and prescription medicines only as told by your health care provider. Talk to your health care provider before taking any herbal medicines or diet supplements. Do not abuse drugs or alcohol. Keep all follow-up visits as told by your health care provider. This is important. Contact a health care provider if: Your breast tissue grows larger or gets more swollen or painful. You have a lump in your testicle. You have blood or discharge coming from your nipples. Your nipple changes shape. You develop a hard or painful lump in your breast. Summary Gynecomastia is an overgrowth of gland tissue in a man's breasts. This may cause one or both breasts to become enlarged. In most cases, breast enlargement is the only symptom. The enlargement may start near the nipple, and the breast tissue may feel firm and rubbery. This condition may go away on its own, without treatment. In some cases, treatment for an underlying medical problem or for drug  abuse may be needed. Take over-the-counter and prescription  medicines only as told by your health care provider. Do not abuse drugs or alcohol. This information is not intended to replace advice given to you by your health care provider. Make sure you discuss any questions you have with your health care provider. Document Revised: 04/05/2019 Document Reviewed: 04/05/2019 Elsevier Patient Education  [place].

## 2022-04-29 ENCOUNTER — Other Ambulatory Visit (HOSPITAL_COMMUNITY): Payer: Self-pay

## 2022-04-29 ENCOUNTER — Telehealth: Payer: Self-pay

## 2022-04-29 DIAGNOSIS — J3081 Allergic rhinitis due to animal (cat) (dog) hair and dander: Secondary | ICD-10-CM | POA: Diagnosis not present

## 2022-04-29 DIAGNOSIS — J301 Allergic rhinitis due to pollen: Secondary | ICD-10-CM | POA: Diagnosis not present

## 2022-04-29 DIAGNOSIS — J3089 Other allergic rhinitis: Secondary | ICD-10-CM | POA: Diagnosis not present

## 2022-04-29 MED ORDER — EPINEPHRINE 0.3 MG/0.3ML IJ SOAJ
INTRAMUSCULAR | 0 refills | Status: DC
Start: 2022-04-29 — End: 2023-06-29
  Filled 2022-04-29: qty 2, 30d supply, fill #0

## 2022-04-29 NOTE — Telephone Encounter (Signed)
Called and left pt a detailed lab reminder. I told pt that he needs to come by the lab in the basement of our office building tomorrow morning BEFORE he gives himself his Humira injection. I told pt that no appt is necessary for labs. I provided pt with the lab hours as well. I have asked that pt call back or send a MyChart message letting me know that he received my vm. I also advised pt to contact us if he has any questions or concerns.

## 2022-04-29 NOTE — Telephone Encounter (Signed)
-----   Message from Yevette Edwards, RN sent at 04/19/2022  3:44 PM EDT ----- Regarding: Humira labs Humira trough level and antibodies tomorrow morning - order is in epic  04/30/22 Humira  injection due

## 2022-04-30 ENCOUNTER — Other Ambulatory Visit (HOSPITAL_COMMUNITY): Payer: Self-pay

## 2022-04-30 ENCOUNTER — Other Ambulatory Visit: Payer: 59

## 2022-04-30 DIAGNOSIS — Z79899 Other long term (current) drug therapy: Secondary | ICD-10-CM

## 2022-04-30 DIAGNOSIS — K5010 Crohn's disease of large intestine without complications: Secondary | ICD-10-CM

## 2022-04-30 NOTE — Telephone Encounter (Signed)
Spoke with patient to remind him that he is due for Humira labs today before his Humira injection.Patient verbalized understanding and had no concerns at the end of the call.

## 2022-05-03 ENCOUNTER — Encounter: Payer: Self-pay | Admitting: Internal Medicine

## 2022-05-05 ENCOUNTER — Telehealth: Payer: Self-pay | Admitting: Pharmacist

## 2022-05-05 NOTE — Telephone Encounter (Signed)
Called patient to schedule an appointment for the [facility]. I was unable to reach the patient so I left a HIPAA-compliant message requesting that the patient return my call.   Luke Van Ausdall, PharmD, BCACP, CPP Clinical Pharmacist [facility] [phone]  

## 2022-05-06 LAB — ADALIMUMAB+AB (SERIAL MONITOR)
Adalimumab Drug Level: 4.7 ug/mL
Anti-Adalimumab Antibody: <25 ng/mL

## 2022-05-07 ENCOUNTER — Encounter: Payer: Self-pay | Admitting: Internal Medicine

## 2022-05-07 DIAGNOSIS — J301 Allergic rhinitis due to pollen: Secondary | ICD-10-CM | POA: Diagnosis not present

## 2022-05-07 DIAGNOSIS — J3089 Other allergic rhinitis: Secondary | ICD-10-CM | POA: Diagnosis not present

## 2022-05-07 DIAGNOSIS — J3081 Allergic rhinitis due to animal (cat) (dog) hair and dander: Secondary | ICD-10-CM | POA: Diagnosis not present

## 2022-05-07 LAB — ESTRADIOL, FREE
Estradiol, Free: 0.63 pg/mL — ABNORMAL HIGH
Estradiol: 29 pg/mL (ref <=29)

## 2022-05-07 LAB — PROLACTIN: Prolactin: 9.8 ng/mL (ref 2.0–18.0)

## 2022-05-07 LAB — TESTOSTERONE TOTAL,FREE,BIO, MALES
Albumin: 4.6 g/dL (ref 3.6–5.1)
Sex Hormone Binding: 24 nmol/L (ref 10–50)
Testosterone, Bioavailable: 223.2 ng/dL (ref 110.0–575.0)
Testosterone, Free: 106.3 pg/mL (ref 46.0–224.0)
Testosterone: 596 ng/dL (ref 250–827)

## 2022-05-10 ENCOUNTER — Other Ambulatory Visit (HOSPITAL_COMMUNITY): Payer: Self-pay

## 2022-05-10 ENCOUNTER — Telehealth: Payer: Self-pay

## 2022-05-10 DIAGNOSIS — K5010 Crohn's disease of large intestine without complications: Secondary | ICD-10-CM

## 2022-05-10 NOTE — Telephone Encounter (Signed)
Please initiate a PA for Humira 40 mg/0.8 ml once a week dosing.  Please let us know the outcome so that a new prescription can be sent.

## 2022-05-11 ENCOUNTER — Encounter: Payer: Self-pay | Admitting: Gastroenterology

## 2022-05-11 ENCOUNTER — Other Ambulatory Visit: Payer: Self-pay | Admitting: Internal Medicine

## 2022-05-11 DIAGNOSIS — N62 Hypertrophy of breast: Secondary | ICD-10-CM

## 2022-05-12 DIAGNOSIS — J301 Allergic rhinitis due to pollen: Secondary | ICD-10-CM | POA: Diagnosis not present

## 2022-05-12 DIAGNOSIS — J3081 Allergic rhinitis due to animal (cat) (dog) hair and dander: Secondary | ICD-10-CM | POA: Diagnosis not present

## 2022-05-12 DIAGNOSIS — J3089 Other allergic rhinitis: Secondary | ICD-10-CM | POA: Diagnosis not present

## 2022-05-13 ENCOUNTER — Other Ambulatory Visit (HOSPITAL_COMMUNITY): Payer: Self-pay

## 2022-05-13 MED ORDER — HUMIRA PEN 40 MG/0.8ML ~~LOC~~ PNKT
1.0000 | PEN_INJECTOR | SUBCUTANEOUS | 11 refills | Status: DC
  Filled 2022-05-13: qty 4, fill #0
  Filled 2022-05-24 – 2022-05-26 (×2): qty 4, 28d supply, fill #0

## 2022-05-13 NOTE — Telephone Encounter (Signed)
Patient Advocate Encounter  Received notification from [place] that prior authorization for HUMIRA PEN 40MG/0.8ML is required.   PA submitted on 7.20.23 Key [id number] Status is pending    Luciano Cutter, CPhT Patient Advocate Phone: [phone]  PA NOT NEEDED. ACTIVE PA IS ON FILE

## 2022-05-13 NOTE — Telephone Encounter (Signed)
New prescription sent to pharmacy on file. Patient notified via [place].

## 2022-05-14 ENCOUNTER — Other Ambulatory Visit (HOSPITAL_COMMUNITY): Payer: Self-pay

## 2022-05-18 DIAGNOSIS — J3081 Allergic rhinitis due to animal (cat) (dog) hair and dander: Secondary | ICD-10-CM | POA: Diagnosis not present

## 2022-05-18 DIAGNOSIS — J301 Allergic rhinitis due to pollen: Secondary | ICD-10-CM | POA: Diagnosis not present

## 2022-05-19 DIAGNOSIS — J310 Chronic rhinitis: Secondary | ICD-10-CM | POA: Diagnosis not present

## 2022-05-21 DIAGNOSIS — J3089 Other allergic rhinitis: Secondary | ICD-10-CM | POA: Diagnosis not present

## 2022-05-21 DIAGNOSIS — J301 Allergic rhinitis due to pollen: Secondary | ICD-10-CM | POA: Diagnosis not present

## 2022-05-21 DIAGNOSIS — J3081 Allergic rhinitis due to animal (cat) (dog) hair and dander: Secondary | ICD-10-CM | POA: Diagnosis not present

## 2022-05-24 ENCOUNTER — Telehealth: Payer: Self-pay | Admitting: Pharmacy Technician

## 2022-05-24 ENCOUNTER — Other Ambulatory Visit (HOSPITAL_COMMUNITY): Payer: Self-pay

## 2022-05-24 NOTE — Telephone Encounter (Signed)
Patient Advocate Encounter  Received notification from [place] that prior authorization for HUMIRA is required.   PA submitted on 7.31.23 Key [id number] Status is pending    Luciano Cutter, CPhT Patient Advocate Phone: [phone]

## 2022-05-25 ENCOUNTER — Other Ambulatory Visit (HOSPITAL_COMMUNITY): Payer: Self-pay

## 2022-05-26 ENCOUNTER — Other Ambulatory Visit (HOSPITAL_COMMUNITY): Payer: Self-pay

## 2022-05-27 ENCOUNTER — Ambulatory Visit: Payer: 59 | Attending: Internal Medicine | Admitting: Pharmacist

## 2022-05-27 ENCOUNTER — Other Ambulatory Visit (HOSPITAL_COMMUNITY): Payer: Self-pay

## 2022-05-27 DIAGNOSIS — Z7189 Other specified counseling: Secondary | ICD-10-CM

## 2022-05-27 DIAGNOSIS — K5010 Crohn's disease of large intestine without complications: Secondary | ICD-10-CM

## 2022-05-27 MED ORDER — HUMIRA PEN 40 MG/0.8ML ~~LOC~~ PNKT
1.0000 | PEN_INJECTOR | SUBCUTANEOUS | 11 refills | Status: DC
  Filled 2022-05-27: qty 4, 28d supply, fill #0
  Filled 2022-06-15: qty 4, 28d supply, fill #1
  Filled 2022-07-09: qty 4, 28d supply, fill #2

## 2022-05-27 NOTE — Progress Notes (Signed)
  S: Patient presents for review of their specialty medication therapy.  Patient is about to start Humira for Crohn's disease. Patient is managed by Dr. Havery Moros for this.   Adherence: confirmed  Efficacy: recently increased frequency to 40 mg once weekly  Dosing:  Crohn disease: SubQ (may continue aminosalicylates and/or corticosteroids; if necessary, azathioprine, mercaptopurine, or methotrexate may also be continued): Maintenance: 40 mg every other week beginning day 29. Note: Some patients may require 40 mg every week as maintenance therapy Karl [address]ock [phone]).  Dose adjustments: Renal: no dose adjustments (has not been studied) Hepatic: no dose adjustments (has not been studied)  Drug-drug interactions:none identified   Screening: TB test: negative Hepatitis:  - Hep B: Heb b Ab pos; surface Ag, core ag negative  Monitoring: S/sx of infection: none CBC: WNL from 03/17/2022 S/sx of hypersensitivity: none S/sx of malignancy: none S/sx of heart failure: none   Other side effects: none  O: Lab Results  Component Value Date   WBC 9.2 03/17/2022   HGB 14.2 03/17/2022   HCT 42.7 03/17/2022   MCV 91.9 03/17/2022   PLT 391.0 03/17/2022      Chemistry      Component Value Date/Time   NA 138 03/17/2022 1528   K 3.9 03/17/2022 1528   CL 102 03/17/2022 1528   CO2 24 03/17/2022 1528   BUN 10 03/17/2022 1528   CREATININE 0.84 03/17/2022 1528   CREATININE 0.75 09/10/2021 1448      Component Value Date/Time   CALCIUM 9.9 03/17/2022 1528   ALKPHOS 74 03/17/2022 1528   AST 33 03/17/2022 1528   ALT 32 03/17/2022 1528   BILITOT 0.6 03/17/2022 1528       A/P: 1. Medication review: Patient currently taking Humira for Crohn's disease. Reviewed the medication with the patient, including the following: Humira is a TNF blocking agent indicated for ankylosing spondylitis, Crohn's disease, Hidradenitis suppurativa, psoriatic arthritis, plaque psoriasis, ulcerative colitis,  and uveitis. Patient educated on purpose, proper use and potential adverse effects of Humira. Possible adverse effects are increased risk of infections, headache, and injection site reactions. There is the possibility of an increased risk of malignancy but it is not well understood if this increased risk is due to there medication or the disease state. There are rare cases of pancytopenia and aplastic anemia. For SubQ injection at separate sites in the thigh or lower abdomen (avoiding areas within 2 inches of navel); rotate injection sites. May leave at room temperature for ~15 to 30 minutes prior to use; do not remove cap or cover while allowing product to reach room temperature. Do not use if solution is discolored or contains particulate matter. Do not administer to skin which is red, tender, bruised, hard, or that has scars, stretch marks, or psoriasis plaques. Needle cap of the prefilled syringe or needle cover for the adalimumab pen may contain latex. Prefilled pens and syringes are available for use by patients and the full amount of the syringe should be injected (self-administration); the vial is intended for institutional use only. Vials do not contain a preservative; discard unused portion. No recommendations for any changes at this time.  Benard Halsted, PharmD, [facility] [place], [place] [phone]

## 2022-05-28 ENCOUNTER — Other Ambulatory Visit (HOSPITAL_COMMUNITY): Payer: Self-pay

## 2022-06-02 DIAGNOSIS — M25512 Pain in left shoulder: Secondary | ICD-10-CM | POA: Diagnosis not present

## 2022-06-03 DIAGNOSIS — J301 Allergic rhinitis due to pollen: Secondary | ICD-10-CM | POA: Diagnosis not present

## 2022-06-03 DIAGNOSIS — J3081 Allergic rhinitis due to animal (cat) (dog) hair and dander: Secondary | ICD-10-CM | POA: Diagnosis not present

## 2022-06-03 DIAGNOSIS — J3089 Other allergic rhinitis: Secondary | ICD-10-CM | POA: Diagnosis not present

## 2022-06-07 ENCOUNTER — Other Ambulatory Visit (HOSPITAL_COMMUNITY): Payer: Self-pay

## 2022-06-08 ENCOUNTER — Other Ambulatory Visit (HOSPITAL_COMMUNITY): Payer: Self-pay

## 2022-06-15 ENCOUNTER — Other Ambulatory Visit (HOSPITAL_COMMUNITY): Payer: Self-pay

## 2022-06-21 ENCOUNTER — Other Ambulatory Visit (HOSPITAL_COMMUNITY): Payer: Self-pay

## 2022-07-09 ENCOUNTER — Other Ambulatory Visit (HOSPITAL_COMMUNITY): Payer: Self-pay

## 2022-07-13 ENCOUNTER — Other Ambulatory Visit (HOSPITAL_COMMUNITY): Payer: Self-pay

## 2022-07-15 ENCOUNTER — Other Ambulatory Visit (HOSPITAL_COMMUNITY): Payer: Self-pay

## 2022-07-15 ENCOUNTER — Ambulatory Visit: Payer: 59 | Admitting: Gastroenterology

## 2022-07-15 ENCOUNTER — Encounter: Payer: Self-pay | Admitting: Gastroenterology

## 2022-07-15 VITALS — BP 128/78 | HR 95 | Ht 68.0 in | Wt 194.0 lb

## 2022-07-15 DIAGNOSIS — Z23 Encounter for immunization: Secondary | ICD-10-CM

## 2022-07-15 DIAGNOSIS — Z79899 Other long term (current) drug therapy: Secondary | ICD-10-CM

## 2022-07-15 DIAGNOSIS — K5010 Crohn's disease of large intestine without complications: Secondary | ICD-10-CM

## 2022-07-15 MED ORDER — HUMIRA PEN 40 MG/0.8ML ~~LOC~~ PNKT: 1.0000 | PEN_INJECTOR | SUBCUTANEOUS | 5 refills | Status: DC

## 2022-07-15 NOTE — Patient Instructions (Addendum)
_______________________________________________________  If you are age 20 or older, your body mass index should be between 23-30. Your Body mass index is 29.5 kg/m. If this is out of the aforementioned range listed, please consider follow up with your Primary Care Provider.  If you are age 43 or younger, your body mass index should be between 19-25. Your Body mass index is 29.5 kg/m. If this is out of the aformentioned range listed, please consider follow up with your Primary Care Provider.   ________________________________________________________  The [facility] providers would like to encourage you to use [facility] to communicate with providers for non-urgent requests or questions.  Due to long hold times on the telephone, sending your provider a message by [facility] may be a faster and more efficient way to get a response.  Please allow 48 business hours for a response.  Please remember that this is for non-urgent requests.  _______________________________________________________  We have given you a flu shot today and supplied you with the VIS from the CDC.  Continue Humira once weekly.  We have sent refills to your pharmacy for 6 months.  Please go to the lab in the basement of our building to have lab work done as you leave today. Hit "B" for basement when you get on the elevator.  When the doors open the lab is on your left.  We will call you with the results. Thank you.  You will be due for a Follow up in 6 months, March 2024.  We will send you a reminder when it is time to schedule or you may call one to two months early when the schedule opens for March.  Thank you for entrusting me with your care and for choosing [facility], Dr. Rainelle Cellar

## 2022-07-15 NOTE — Progress Notes (Signed)
HPI :  21 year old male here for a follow-up visit for ileocolonic Crohn's.    Crohn's disease history: Dx of Crohn's colitis when he presented in April 2021 with severe diarrhea.  Previously followed by Dr. Yehuda Savannah at [facility].  Initially treated with methotrexate and folic acid, colonoscopy June 2022 showed active disease.  He was started on Humira at that time  Since last visit: He is currently a senior at [employer].  He is accompanied by his mother in the office today.    At our last visit he received the PCV 13 pneumococcal vaccine.  We obtain some labs.  He had a scheduled surveillance colonoscopy to assess his disease activity after starting Humira.  Unfortunately he had some mild inflammation appreciated as outlined below.  Biopsies confirmed active inflammation.  He symptomatically was not feeling too bad at the time of the exam.  We checked his Humira levels which was 4.7 and no antibody titer.  Based on this level we increase his Humira to once weekly.  He has been on a once weekly dosing for about the past 2 months.  He appears to be tolerating it well.  Denies any problems with the medication.  1 month ago he had a little bit of pain in his abdomen for a few days and that resolved.  He is generally feeling well without any complaints.  He is interested in getting the flu shot.  Recall he does not use NSAIDs he does not smoke cigarettes.   He does recall early in his diagnosis he was diagnosed and treated for C. difficile on 2 occasions.   IBD Health Care Maintenance: Annual Flu Vaccine - Date: DUE Pneumococcal Vaccine if receiving immunosuppression: - Date: PSV13 2023 Pap Smear annually if immunosuppressed - Date  TB testing if on anti-TNF, yearly - Date 01/2022 Vitamin D screening - Date; 02/2022 Last Colonoscopy - Date: 03/2022  Endoscopic history:  EGD 03/2021: - Normal esophagus.  - Normal stomach. Biopsied.  - Normal examined duodenum. Biopsied.   - Normal esophagus.  Biopsied.    Colonoscopy 03/2021: The perianal and digital rectal examinations were normal.  The terminal ileum appeared normal. Estimated blood loss was minimal.  Biopsies were taken with a cold forceps for histology.  Inflammation characterized by multiple shallow ulcerations was found as patches in the transverse colon and in the ascending colon. The anus, the rectum, the recto-sigmoid colon, the sigmoid colon, the descending colon, the splenic flexure and the distal transverse colon were spared. The inflammation was moderate in severity. Biopsies were taken with a cold forceps for histology. Estimated blood loss was minimal.    Diagnosis     A: Stomach, biopsy - Gastric mucosa with no diagnostic alterations including no Helicobacter pylori organisms seen   B: Small bowel, duodenum, biopsy - Duodenal mucosa with no diagnostic alterations including no evidence of celiac disease   C: Esophagus, biopsy - Fragments of esophageal squamous epithelium with no diagnostic alterations including no increased eosinophil infiltrate   D: Colon, right, biopsy - Chronic active colitis with mild to moderate activity, consistent with the patient's clinical history of Crohn's disease - No evidence of dysplasia   E: Colon, transverse, biopsy - Chronic active colitis with moderate activity and granulomatous formation, consistent with the patient's clinical history of Crohn's disease - No evidence of dysplasia   F: Colon, left, biopsy - Chronic colitis with focal mild activity and granulomatous formation, consistent with the patient's clinical history of Crohn's disease - No  evidence of dysplasia   G: Small bowel, terminal ileum, biopsy - Small bowel mucosa with prominent lymphoid aggregates, consistent with ileal mucosa with no diagnostic alterations     EGD 01/2020:    - Localized nodularity of the distal esophagus,        otherwise normal esophagus. Biopsied.      - Nodular mucosa in the gastric  antrum. Biopsied.      - Normal examined duodenum. Biopsied.    Colonoscopy 01/2020: - Congested, erythematous areas of colonic mucosa with apthae. Biopsied.  - The examined portion of the ileum was normal.  Biopsied.    A: Stomach, biopsy - Chronic superficial active gastritis with rare intramucosal granulomas compatible with involvement by patient's known history of inflammatory bowel disease - Reactive foveolar hyperplasia - An immunohistochemical stain for H. Pylori has been ordered and the results will be issued in an addendum - No evidence of malignancy, dysplasia, or metaplasia   B: Esophagus, biopsy - Superficial strips of benign squamous mucosa - No evidence of dysplasia or metaplasia   C: Small bowel, duodenum, biopsy - Acute and chronic duodenitis compatible with involvement by patient's known history of inflammatory bowel disease - No evidence of malignancy or dysplasia   D: Colon, right, biopsy - Focally active chronic colitis consistent with involvement by patient's known history of inflammatory bowel disease - No evidence of malignancy, dysplasia, granulomas, or viral cytopathic effect   E: Colon, transverse, biopsy - Focally active chronic colitis consistent with involvement by patient's known history of inflammatory bowel disease - No evidence of malignancy, dysplasia, granulomas, or viral cytopathic effect   F: Colon, left, biopsy - Features of chronic mucosal injury - No evidence of active colitis in this specimen - No evidence of malignancy, dysplasia, granulomas, or viral cytopathic effect   G: Small bowel, terminal ileum, biopsy - Focally active chronic ileitis compatible with involvement by patient's known history of inflammatory bowel disease - No evidence of malignancy, dysplasia, granulomas, or viral cytopathic effect    Colonoscopy 04/09/22: The perianal and digital rectal examinations were normal. - The terminal ileum appeared normal. - Patchy very  faint inflammatory characterized by granularity and loss of vascularity was found in the sigmoid colon, in the transverse colon and in the ascending colon. No ulcerations or erosions noted. - Internal hemorrhoids were found during retroflexion. The hemorrhoids were small. - The exam was otherwise without abnormality. - Biopsies were taken with a cold forceps for histology from right, transverse, and left colon.   1. Surgical [P], right colon biopsies MILD TO MODERATE ACTIVE COLITIS WITH ARCHITECTURAL DISARRAY. LYMPHOPLASMACYTIC INFILTRATE AND LYMPHOID AGGREGATES IN THE LAMINA PROPRIA CONSISTENT WITH CLINICAL HISTORY OF CROHN'S DISEASE. NO DYSPLASIA, GRANULOMATOUS INFLAMMATION OR MALIGNANCY IS SEEN. 2. Surgical [P], transverse colon biopsies MILD ACTIVE COLITIS WITH ARCHITECTURAL DISARRAY. LYMPHOPLASMACYTIC INFILTRATE WITH LYMPHOID AGGREGATES ARE CONSISTENT WITH CLINICAL HISTORY OF CROHN'S DISEASE. NO DYSPLASIA, GRANULOMATOUS INFLAMMATION OR MALIGNANCY IS SEEN. 3. Surgical [P], left colon and rectal biopsies MILD ACTIVE COLITIS WITH ARCHITECTURAL DISARRAY. PROMINENT LYMPHOPLASMACYTIC INFILTRATE AND LYMPHOID AGGREGATES ARE SEEN CONSISTENT WITH CLINICAL HISTORY OF CROHN'S DISEASE. NO DYSPLASIA, GRANULOMATOUS INFLAMMATION OR MALIGNANCY IS SEEN. COMMENT: IMMUNOHISTOCHEMISTRY WITH APPROPRIATE CONTROLS FOR CD3 AND CD20 WAS PERFORMED ON ALL 3 BIOPSIES AND CONFIRMS THE POLYMORPHOUS NATURE OF THE LYMPHOID AGGREGATES. IMMUNOHISTOCHEMISTRY FOR CMV IS BEING ORDERED AND WILL BE REPORTED IN AN ADDENDUM. CLINICAL AND RADIOLOGICAL CORRELATION IS REQUIRED. THE CASE WAS DISCUSSED IN INTRADEPARTMENTAL CONSULTATION WITH DR. [place] DR. SMIR WHO CONCUR WITH  THE FINDINGS.   Humira level 4.7, AB level of 0  Increased Humira to once weekly  Past Medical History:  Diagnosis Date   Crohn's disease ([place])    Depression    Eczema    Uveitis      Past Surgical History:  Procedure Laterality  Date   TURBINATE REDUCTION Bilateral 03/10/2021   Procedure: BILATERAL TURBINATE REDUCTION;  Surgeon: Leta Baptist, MD;  Location: [place];  Service: ENT;  Laterality: Bilateral;   Family History  Problem Relation Age of Onset   Hypertension Mother    Diabetes Mother    Hypertension Father    Hypertension Maternal Grandmother    Prostate cancer Maternal Grandfather    Hypertension Maternal Grandfather    Hyperlipidemia Maternal Grandfather    Glaucoma Maternal Grandfather    Diabetes Maternal Uncle    Social History   Tobacco Use   Smoking status: Never   Smokeless tobacco: Never  Vaping Use   Vaping Use: Never used  Substance Use Topics   Alcohol use: Never   Drug use: Never   Current Outpatient Medications  Medication Sig Dispense Refill   Cholecalciferol 125 MCG (5000 UT) capsule Take by mouth.     Dapsone 5 % topical gel APPLY 1 APPLICATION ON THE SKIN IN THE MORNING 60 g 2   EPINEPHrine (EPIPEN 2-PAK) 0.3 mg/0.3 mL IJ SOAJ injection Inject as directed as needed for systemic reactions 2 each 0   EPINEPHrine 0.3 mg/0.3 mL IJ SOAJ injection Inject into the muscle as directed.     finasteride (PROPECIA) 1 MG tablet TAKE 1 TABLET BY MOUTH EVERY DAY 90 tablet 3   fluticasone (FLONASE) 50 MCG/ACT nasal spray Use 2 sprays into both nostrils daily 16 g 5   loratadine (CLARITIN REDITABS) 10 MG dissolvable tablet Take by mouth.     tacrolimus (PROTOPIC) 0.1 % ointment Apply 1 Dose topically as needed.     tretinoin (RETIN-A) 0.05 % cream Apply on the skin nightly; Use a few nights a week and increase to nightly as tolerated. 20 g 2   triamcinolone cream (KENALOG) 0.1 % Apply 1 gram to skin twice a day 908 g 2   Adalimumab (HUMIRA PEN) 40 MG/0.8ML PNKT Inject 1 Pen into the skin once a week. 4 each 5   No current facility-administered medications for this visit.   No Known Allergies   Review of Systems: All systems reviewed and negative except where noted in HPI.    Lab Results  Component Value Date   WBC 9.2 03/17/2022   HGB 14.2 03/17/2022   HCT 42.7 03/17/2022   MCV 91.9 03/17/2022   PLT 391.0 03/17/2022    Lab Results  Component Value Date   CREATININE 0.84 03/17/2022   BUN 10 03/17/2022   NA 138 03/17/2022   K 3.9 03/17/2022   CL 102 03/17/2022   CO2 24 03/17/2022    Lab Results  Component Value Date   ALT 32 03/17/2022   AST 33 03/17/2022   ALKPHOS 74 03/17/2022   BILITOT 0.6 03/17/2022     Physical Exam: BP 128/78   Pulse 95   Ht 5' 8"  (1.727 m)   Wt 194 lb (88 kg)   BMI 29.50 kg/m  Constitutional: Pleasant,well-developed, male in no acute distress. Neurological: Alert and oriented to person place and time. Psychiatric: Normal mood and affect. Behavior is normal.   ASSESSMENT: 21 y.o. male here for assessment of the following  1. Crohn's disease of  large intestine without complication ([place])   2. High risk medication use    As above, on Humira every other week clinically feeling improved but had active inflammation in his colon disease activity assessment colonoscopy.  Humira level was subtherapeutic without antibodies, Humira increased to weekly dosing.  He is clinically feeling well and denies complaints, tolerating the regimen well.  Discussed Humira in general, long-term risks of the regimen.  We discussed potentially adding a thiopurine to this to reduce risk for immunogenicity.  Discussed risk benefits of thiopurine use.  He has been on methotrexate in the past, want to avoid thiopurines if possible in this young male with mild disease given risks of lymphoproliferative disorders, which I think reasonable. Will continue Humira monotherapy.  In the next month or 2 recommend checking a fecal calprotectin to make sure negative on Humira once weekly.  If he has any flares of symptoms in the interim he will let me know.  Otherwise gave him a flu shot today, also recommend the COVID-vaccine.  I will see him again in 6  months for routine office visit or sooner with issues.  He agrees  PLAN: - continue Humira once weekly - refills - fecal calprotectin  - flu shot today, also recommend COVID vaccine - f/u 6 months - discussed thiopurines, will hold off for now  Jolly Mango, MD [facility]

## 2022-08-03 ENCOUNTER — Other Ambulatory Visit (HOSPITAL_COMMUNITY): Payer: Self-pay

## 2022-08-03 ENCOUNTER — Other Ambulatory Visit: Payer: Self-pay | Admitting: Pharmacist

## 2022-08-03 DIAGNOSIS — K5010 Crohn's disease of large intestine without complications: Secondary | ICD-10-CM

## 2022-08-03 MED ORDER — HUMIRA PEN 40 MG/0.8ML ~~LOC~~ PNKT
1.0000 | PEN_INJECTOR | SUBCUTANEOUS | 5 refills | Status: DC
  Filled 2022-08-03: qty 4, 28d supply, fill #0
  Filled 2022-09-02: qty 4, 28d supply, fill #1
  Filled 2022-09-30 – 2022-10-08 (×3): qty 4, 28d supply, fill #2

## 2022-08-05 ENCOUNTER — Other Ambulatory Visit: Payer: 59

## 2022-08-05 DIAGNOSIS — Z79899 Other long term (current) drug therapy: Secondary | ICD-10-CM

## 2022-08-05 DIAGNOSIS — K5010 Crohn's disease of large intestine without complications: Secondary | ICD-10-CM

## 2022-08-09 DIAGNOSIS — J3089 Other allergic rhinitis: Secondary | ICD-10-CM | POA: Diagnosis not present

## 2022-08-11 LAB — CALPROTECTIN, FECAL: Calprotectin, Fecal: 39 ug/g (ref 0–120)

## 2022-08-12 ENCOUNTER — Other Ambulatory Visit (HOSPITAL_COMMUNITY): Payer: Self-pay

## 2022-08-16 DIAGNOSIS — J3089 Other allergic rhinitis: Secondary | ICD-10-CM | POA: Diagnosis not present

## 2022-08-23 DIAGNOSIS — J3089 Other allergic rhinitis: Secondary | ICD-10-CM | POA: Diagnosis not present

## 2022-08-30 DIAGNOSIS — J3089 Other allergic rhinitis: Secondary | ICD-10-CM | POA: Diagnosis not present

## 2022-08-31 ENCOUNTER — Other Ambulatory Visit (HOSPITAL_COMMUNITY): Payer: Self-pay

## 2022-09-02 ENCOUNTER — Other Ambulatory Visit (HOSPITAL_COMMUNITY): Payer: Self-pay

## 2022-09-06 DIAGNOSIS — J3089 Other allergic rhinitis: Secondary | ICD-10-CM | POA: Diagnosis not present

## 2022-09-08 ENCOUNTER — Other Ambulatory Visit (HOSPITAL_COMMUNITY): Payer: Self-pay

## 2022-09-13 DIAGNOSIS — J3089 Other allergic rhinitis: Secondary | ICD-10-CM | POA: Diagnosis not present

## 2022-09-20 DIAGNOSIS — J3089 Other allergic rhinitis: Secondary | ICD-10-CM | POA: Diagnosis not present

## 2022-09-29 DIAGNOSIS — J3089 Other allergic rhinitis: Secondary | ICD-10-CM | POA: Diagnosis not present

## 2022-09-29 DIAGNOSIS — J301 Allergic rhinitis due to pollen: Secondary | ICD-10-CM | POA: Diagnosis not present

## 2022-09-29 DIAGNOSIS — J3081 Allergic rhinitis due to animal (cat) (dog) hair and dander: Secondary | ICD-10-CM | POA: Diagnosis not present

## 2022-09-30 ENCOUNTER — Other Ambulatory Visit (HOSPITAL_COMMUNITY): Payer: Self-pay

## 2022-10-01 ENCOUNTER — Other Ambulatory Visit (HOSPITAL_COMMUNITY): Payer: Self-pay

## 2022-10-06 ENCOUNTER — Other Ambulatory Visit (HOSPITAL_COMMUNITY): Payer: Self-pay

## 2022-10-08 ENCOUNTER — Other Ambulatory Visit (HOSPITAL_COMMUNITY): Payer: Self-pay

## 2022-10-08 ENCOUNTER — Other Ambulatory Visit: Payer: Self-pay

## 2022-10-08 DIAGNOSIS — J3089 Other allergic rhinitis: Secondary | ICD-10-CM | POA: Diagnosis not present

## 2022-10-08 DIAGNOSIS — J3081 Allergic rhinitis due to animal (cat) (dog) hair and dander: Secondary | ICD-10-CM | POA: Diagnosis not present

## 2022-10-08 DIAGNOSIS — J301 Allergic rhinitis due to pollen: Secondary | ICD-10-CM | POA: Diagnosis not present

## 2022-10-11 DIAGNOSIS — J3081 Allergic rhinitis due to animal (cat) (dog) hair and dander: Secondary | ICD-10-CM | POA: Diagnosis not present

## 2022-10-11 DIAGNOSIS — L2089 Other atopic dermatitis: Secondary | ICD-10-CM | POA: Diagnosis not present

## 2022-10-11 DIAGNOSIS — J301 Allergic rhinitis due to pollen: Secondary | ICD-10-CM | POA: Diagnosis not present

## 2022-10-11 DIAGNOSIS — J3089 Other allergic rhinitis: Secondary | ICD-10-CM | POA: Diagnosis not present

## 2022-10-12 ENCOUNTER — Other Ambulatory Visit: Payer: Self-pay

## 2022-10-22 DIAGNOSIS — J3089 Other allergic rhinitis: Secondary | ICD-10-CM | POA: Diagnosis not present

## 2022-10-22 DIAGNOSIS — J301 Allergic rhinitis due to pollen: Secondary | ICD-10-CM | POA: Diagnosis not present

## 2022-10-22 DIAGNOSIS — J3081 Allergic rhinitis due to animal (cat) (dog) hair and dander: Secondary | ICD-10-CM | POA: Diagnosis not present

## 2022-11-02 DIAGNOSIS — J3089 Other allergic rhinitis: Secondary | ICD-10-CM | POA: Diagnosis not present

## 2022-11-02 DIAGNOSIS — J301 Allergic rhinitis due to pollen: Secondary | ICD-10-CM | POA: Diagnosis not present

## 2022-11-02 DIAGNOSIS — J3081 Allergic rhinitis due to animal (cat) (dog) hair and dander: Secondary | ICD-10-CM | POA: Diagnosis not present

## 2022-11-09 ENCOUNTER — Other Ambulatory Visit (HOSPITAL_COMMUNITY): Payer: Self-pay

## 2022-11-09 MED ORDER — HUMIRA (2 PEN) 40 MG/0.8ML ~~LOC~~ AJKT
AUTO-INJECTOR | SUBCUTANEOUS | 2 refills | Status: DC
  Filled 2022-11-10: qty 4, 28d supply, fill #0
  Filled 2022-12-06: qty 4, 28d supply, fill #1
  Filled 2023-01-05 – 2023-01-10 (×2): qty 4, 28d supply, fill #2

## 2022-11-10 ENCOUNTER — Other Ambulatory Visit (HOSPITAL_COMMUNITY): Payer: Self-pay

## 2022-11-12 ENCOUNTER — Other Ambulatory Visit (HOSPITAL_COMMUNITY): Payer: Self-pay

## 2022-11-15 ENCOUNTER — Other Ambulatory Visit (HOSPITAL_COMMUNITY): Payer: Self-pay

## 2022-12-06 ENCOUNTER — Other Ambulatory Visit: Payer: Self-pay

## 2022-12-09 ENCOUNTER — Encounter: Payer: Self-pay | Admitting: Gastroenterology

## 2022-12-10 ENCOUNTER — Other Ambulatory Visit: Payer: Self-pay

## 2022-12-20 ENCOUNTER — Encounter: Payer: Self-pay | Admitting: Gastroenterology

## 2023-01-04 ENCOUNTER — Other Ambulatory Visit (HOSPITAL_COMMUNITY): Payer: Self-pay

## 2023-01-05 ENCOUNTER — Other Ambulatory Visit (HOSPITAL_COMMUNITY): Payer: Self-pay

## 2023-01-07 ENCOUNTER — Other Ambulatory Visit (HOSPITAL_COMMUNITY): Payer: Self-pay

## 2023-01-07 ENCOUNTER — Other Ambulatory Visit: Payer: Self-pay

## 2023-01-10 ENCOUNTER — Other Ambulatory Visit (HOSPITAL_COMMUNITY): Payer: Self-pay

## 2023-01-10 ENCOUNTER — Other Ambulatory Visit: Payer: Self-pay

## 2023-02-01 ENCOUNTER — Other Ambulatory Visit (HOSPITAL_COMMUNITY): Payer: Self-pay

## 2023-02-02 ENCOUNTER — Other Ambulatory Visit: Payer: Self-pay | Admitting: Gastroenterology

## 2023-02-02 ENCOUNTER — Other Ambulatory Visit: Payer: Self-pay

## 2023-02-02 ENCOUNTER — Other Ambulatory Visit (HOSPITAL_COMMUNITY): Payer: Self-pay

## 2023-02-02 ENCOUNTER — Other Ambulatory Visit: Payer: Self-pay | Admitting: Pharmacist

## 2023-02-02 DIAGNOSIS — K5010 Crohn's disease of large intestine without complications: Secondary | ICD-10-CM

## 2023-02-02 MED ORDER — ADALIMUMAB 40 MG/0.8ML ~~LOC~~ AJKT
1.0000 | AUTO-INJECTOR | SUBCUTANEOUS | 5 refills | Status: DC
  Filled 2023-02-02: qty 4, 28d supply, fill #0
  Filled 2023-03-03: qty 4, 28d supply, fill #1

## 2023-02-02 MED ORDER — ADALIMUMAB 40 MG/0.8ML ~~LOC~~ AJKT
1.0000 | AUTO-INJECTOR | SUBCUTANEOUS | 5 refills | Status: DC
  Filled 2023-02-02: qty 4, 28d supply, fill #0

## 2023-02-04 ENCOUNTER — Other Ambulatory Visit (HOSPITAL_COMMUNITY): Payer: Self-pay

## 2023-02-18 ENCOUNTER — Other Ambulatory Visit (HOSPITAL_COMMUNITY): Payer: Self-pay

## 2023-02-18 ENCOUNTER — Other Ambulatory Visit: Payer: Self-pay

## 2023-03-02 DIAGNOSIS — J3081 Allergic rhinitis due to animal (cat) (dog) hair and dander: Secondary | ICD-10-CM | POA: Diagnosis not present

## 2023-03-02 DIAGNOSIS — J301 Allergic rhinitis due to pollen: Secondary | ICD-10-CM | POA: Diagnosis not present

## 2023-03-02 DIAGNOSIS — J3089 Other allergic rhinitis: Secondary | ICD-10-CM | POA: Diagnosis not present

## 2023-03-03 ENCOUNTER — Other Ambulatory Visit (HOSPITAL_COMMUNITY): Payer: Self-pay

## 2023-03-04 ENCOUNTER — Other Ambulatory Visit: Payer: Self-pay

## 2023-03-16 DIAGNOSIS — J3081 Allergic rhinitis due to animal (cat) (dog) hair and dander: Secondary | ICD-10-CM | POA: Diagnosis not present

## 2023-03-16 DIAGNOSIS — J3089 Other allergic rhinitis: Secondary | ICD-10-CM | POA: Diagnosis not present

## 2023-03-16 DIAGNOSIS — J301 Allergic rhinitis due to pollen: Secondary | ICD-10-CM | POA: Diagnosis not present

## 2023-03-21 NOTE — Progress Notes (Unsigned)
HPI : last seen September  22 year old male here for a follow-up visit for ileocolonic Crohn's.     Crohn's disease history: Dx of Crohn's colitis when he presented in April 2021 with severe diarrhea.  Previously followed by Dr. Jacqlyn Krauss at [facility].  Initially treated with methotrexate and folic acid, colonoscopy June 2022 showed active disease.  He was started on Humira at that time   Since last visit: He is currently a senior at [employer].  He is accompanied by his mother in the office today.     At our last visit he received the PCV 13 pneumococcal vaccine.  We obtain some labs.  He had a scheduled surveillance colonoscopy to assess his disease activity after starting Humira.  Unfortunately he had some mild inflammation appreciated as outlined below.  Biopsies confirmed active inflammation.  He symptomatically was not feeling too bad at the time of the exam.  We checked his Humira levels which was 4.7 and no antibody titer.  Based on this level we increase his Humira to once weekly.   He has been on a once weekly dosing for about the past 2 months.  He appears to be tolerating it well.  Denies any problems with the medication.  1 month ago he had a little bit of pain in his abdomen for a few days and that resolved.  He is generally feeling well without any complaints.  He is interested in getting the flu shot.  Recall he does not use NSAIDs he does not smoke cigarettes.   He does recall early in his diagnosis he was diagnosed and treated for C. difficile on 2 occasions.   IBD Health Care Maintenance: Annual Flu Vaccine - Date: DUE Pneumococcal Vaccine if receiving immunosuppression: - Date: PSV13 2023 Pap Smear annually if immunosuppressed - Date  TB testing if on anti-TNF, yearly - Date 01/2022 Vitamin D screening - Date; 02/2022 Last Colonoscopy - Date: 03/2022   Endoscopic history:  EGD 03/2021: - Normal esophagus.  - Normal stomach. Biopsied.  - Normal examined duodenum.  Biopsied.   - Normal esophagus. Biopsied.    Colonoscopy 03/2021: The perianal and digital rectal examinations were normal.  The terminal ileum appeared normal. Estimated blood loss was minimal.  Biopsies were taken with a cold forceps for histology.  Inflammation characterized by multiple shallow ulcerations was found as patches in the transverse colon and in the ascending colon. The anus, the rectum, the recto-sigmoid colon, the sigmoid colon, the descending colon, the splenic flexure and the distal transverse colon were spared. The inflammation was moderate in severity. Biopsies were taken with a cold forceps for histology. Estimated blood loss was minimal.    Diagnosis     A: Stomach, biopsy - Gastric mucosa with no diagnostic alterations including no Helicobacter pylori organisms seen   B: Small bowel, duodenum, biopsy - Duodenal mucosa with no diagnostic alterations including no evidence of celiac disease   C: Esophagus, biopsy - Fragments of esophageal squamous epithelium with no diagnostic alterations including no increased eosinophil infiltrate   D: Colon, right, biopsy - Chronic active colitis with mild to moderate activity, consistent with the patient's clinical history of Crohn's disease - No evidence of dysplasia   E: Colon, transverse, biopsy - Chronic active colitis with moderate activity and granulomatous formation, consistent with the patient's clinical history of Crohn's disease - No evidence of dysplasia   F: Colon, left, biopsy - Chronic colitis with focal mild activity and granulomatous formation, consistent with the  patient's clinical history of Crohn's disease - No evidence of dysplasia   G: Small bowel, terminal ileum, biopsy - Small bowel mucosa with prominent lymphoid aggregates, consistent with ileal mucosa with no diagnostic alterations     EGD 01/2020:    - Localized nodularity of the distal esophagus,        otherwise normal esophagus. Biopsied.      -  Nodular mucosa in the gastric antrum. Biopsied.      - Normal examined duodenum. Biopsied.    Colonoscopy 01/2020: - Congested, erythematous areas of colonic mucosa with apthae. Biopsied.  - The examined portion of the ileum was normal.  Biopsied.    A: Stomach, biopsy - Chronic superficial active gastritis with rare intramucosal granulomas compatible with involvement by patient's known history of inflammatory bowel disease - Reactive foveolar hyperplasia - An immunohistochemical stain for H. Pylori has been ordered and the results will be issued in an addendum - No evidence of malignancy, dysplasia, or metaplasia   B: Esophagus, biopsy - Superficial strips of benign squamous mucosa - No evidence of dysplasia or metaplasia   C: Small bowel, duodenum, biopsy - Acute and chronic duodenitis compatible with involvement by patient's known history of inflammatory bowel disease - No evidence of malignancy or dysplasia   D: Colon, right, biopsy - Focally active chronic colitis consistent with involvement by patient's known history of inflammatory bowel disease - No evidence of malignancy, dysplasia, granulomas, or viral cytopathic effect   E: Colon, transverse, biopsy - Focally active chronic colitis consistent with involvement by patient's known history of inflammatory bowel disease - No evidence of malignancy, dysplasia, granulomas, or viral cytopathic effect   F: Colon, left, biopsy - Features of chronic mucosal injury - No evidence of active colitis in this specimen - No evidence of malignancy, dysplasia, granulomas, or viral cytopathic effect   G: Small bowel, terminal ileum, biopsy - Focally active chronic ileitis compatible with involvement by patient's known history of inflammatory bowel disease - No evidence of malignancy, dysplasia, granulomas, or viral cytopathic effect     Colonoscopy 04/09/22: The perianal and digital rectal examinations were normal. - The terminal ileum  appeared normal. - Patchy very faint inflammatory characterized by granularity and loss of vascularity was found in the sigmoid colon, in the transverse colon and in the ascending colon. No ulcerations or erosions noted. - Internal hemorrhoids were found during retroflexion. The hemorrhoids were small. - The exam was otherwise without abnormality. - Biopsies were taken with a cold forceps for histology from right, transverse, and left colon.     1. Surgical [P], right colon biopsies MILD TO MODERATE ACTIVE COLITIS WITH ARCHITECTURAL DISARRAY. LYMPHOPLASMACYTIC INFILTRATE AND LYMPHOID AGGREGATES IN THE LAMINA PROPRIA CONSISTENT WITH CLINICAL HISTORY OF CROHN'S DISEASE. NO DYSPLASIA, GRANULOMATOUS INFLAMMATION OR MALIGNANCY IS SEEN. 2. Surgical [P], transverse colon biopsies MILD ACTIVE COLITIS WITH ARCHITECTURAL DISARRAY. LYMPHOPLASMACYTIC INFILTRATE WITH LYMPHOID AGGREGATES ARE CONSISTENT WITH CLINICAL HISTORY OF CROHN'S DISEASE. NO DYSPLASIA, GRANULOMATOUS INFLAMMATION OR MALIGNANCY IS SEEN. 3. Surgical [P], left colon and rectal biopsies MILD ACTIVE COLITIS WITH ARCHITECTURAL DISARRAY. PROMINENT LYMPHOPLASMACYTIC INFILTRATE AND LYMPHOID AGGREGATES ARE SEEN CONSISTENT WITH CLINICAL HISTORY OF CROHN'S DISEASE. NO DYSPLASIA, GRANULOMATOUS INFLAMMATION OR MALIGNANCY IS SEEN. COMMENT: IMMUNOHISTOCHEMISTRY WITH APPROPRIATE CONTROLS FOR CD3 AND CD20 WAS PERFORMED ON ALL 3 BIOPSIES AND CONFIRMS THE POLYMORPHOUS NATURE OF THE LYMPHOID AGGREGATES. IMMUNOHISTOCHEMISTRY FOR CMV IS BEING ORDERED AND WILL BE REPORTED IN AN ADDENDUM. CLINICAL AND RADIOLOGICAL CORRELATION IS REQUIRED. THE CASE WAS DISCUSSED IN INTRADEPARTMENTAL  CONSULTATION WITH DR. MARK LEGOLVAN AND DR. SMIR WHO CONCUR WITH THE FINDINGS.     Humira level 4.7, AB level of 0   Increased Humira to once weekly  22 y.o. male here for assessment of the following   1. Crohn's disease of large intestine without complication ([facility])    2. High risk medication use     As above, on Humira every other week clinically feeling improved but had active inflammation in his colon disease activity assessment colonoscopy.  Humira level was subtherapeutic without antibodies, Humira increased to weekly dosing.  He is clinically feeling well and denies complaints, tolerating the regimen well.  Discussed Humira in general, long-term risks of the regimen.  We discussed potentially adding a thiopurine to this to reduce risk for immunogenicity.  Discussed risk benefits of thiopurine use.  He has been on methotrexate in the past, want to avoid thiopurines if possible in this young male with mild disease given risks of lymphoproliferative disorders, which I think reasonable. Will continue Humira monotherapy.  In the next month or 2 recommend checking a fecal calprotectin to make sure negative on Humira once weekly.  If he has any flares of symptoms in the interim he will let me know.  Otherwise gave him a flu shot today, also recommend the COVID-vaccine.  I will see him again in 6 months for routine office visit or sooner with issues.  He agrees   PLAN: - continue Humira once weekly - refills - fecal calprotectin  - flu shot today, also recommend COVID vaccine - f/u 6 months - discussed thiopurines, will hold off for now   Fecal calpro 07/2022 - 39  Past Medical History:  Diagnosis Date   Crohn's disease ([facility])    Depression    Eczema    Uveitis      Past Surgical History:  Procedure Laterality Date   TURBINATE REDUCTION Bilateral 03/10/2021   Procedure: BILATERAL TURBINATE REDUCTION;  Surgeon: Newman Pies, MD;  Location: [facility];  Service: ENT;  Laterality: Bilateral;   Family History  Problem Relation Age of Onset   Hypertension Mother    Diabetes Mother    Hypertension Father    Hypertension Maternal Grandmother    Prostate cancer Maternal Grandfather    Hypertension Maternal Grandfather    Hyperlipidemia Maternal  Grandfather    Glaucoma Maternal Grandfather    Diabetes Maternal Uncle    Social History   Tobacco Use   Smoking status: Never   Smokeless tobacco: Never  Vaping Use   Vaping Use: Never used  Substance Use Topics   Alcohol use: Never   Drug use: Never   Current Outpatient Medications  Medication Sig Dispense Refill   Adalimumab 40 MG/0.8ML PNKT Inject 1 Pen into the skin once a week. 4 each 5   Cholecalciferol 125 MCG (5000 UT) capsule Take by mouth.     EPINEPHrine (EPIPEN 2-PAK) 0.3 mg/0.3 mL IJ SOAJ injection Inject as directed as needed for systemic reactions 2 each 0   EPINEPHrine 0.3 mg/0.3 mL IJ SOAJ injection Inject into the muscle as directed.     finasteride (PROPECIA) 1 MG tablet TAKE 1 TABLET BY MOUTH EVERY DAY 90 tablet 3   fluticasone (FLONASE) 50 MCG/ACT nasal spray Use 2 sprays into both nostrils daily 16 g 5   loratadine (CLARITIN REDITABS) 10 MG dissolvable tablet Take by mouth.     tacrolimus (PROTOPIC) 0.1 % ointment Apply 1 Dose topically as needed.  tretinoin (RETIN-A) 0.05 % cream Apply on the skin nightly; Use a few nights a week and increase to nightly as tolerated. 20 g 2   triamcinolone cream (KENALOG) 0.1 % Apply 1 gram to skin twice a day 908 g 2   No current facility-administered medications for this visit.   No Known Allergies   Review of Systems: All systems reviewed and negative except where noted in HPI.    No results found.  Physical Exam: There were no vitals taken for this visit. Constitutional: Pleasant,well-developed, ***male in no acute distress. HEENT: Normocephalic and atraumatic. Conjunctivae are normal. No scleral icterus. Neck supple.  Cardiovascular: Normal rate, regular rhythm.  Pulmonary/chest: Effort normal and breath sounds normal. No wheezing, rales or rhonchi. Abdominal: Soft, nondistended, nontender. Bowel sounds active throughout. There are no masses palpable. No hepatomegaly. Extremities: no  edema Lymphadenopathy: No cervical adenopathy noted. Neurological: Alert and oriented to person place and time. Skin: Skin is warm and dry. No rashes noted. Psychiatric: Normal mood and affect. Behavior is normal.   ASSESSMENT: 22 y.o. male here for assessment of the following  No diagnosis found.  PLAN:   Etta Grandchild, MD

## 2023-03-22 ENCOUNTER — Telehealth: Payer: Self-pay

## 2023-03-22 ENCOUNTER — Other Ambulatory Visit: Payer: Self-pay

## 2023-03-22 ENCOUNTER — Ambulatory Visit: Payer: Commercial Managed Care - PPO | Admitting: Gastroenterology

## 2023-03-22 ENCOUNTER — Other Ambulatory Visit: Payer: Self-pay | Admitting: Pharmacist

## 2023-03-22 ENCOUNTER — Encounter: Payer: Self-pay | Admitting: Gastroenterology

## 2023-03-22 ENCOUNTER — Other Ambulatory Visit (HOSPITAL_COMMUNITY): Payer: Self-pay

## 2023-03-22 ENCOUNTER — Other Ambulatory Visit (INDEPENDENT_AMBULATORY_CARE_PROVIDER_SITE_OTHER): Payer: Commercial Managed Care - PPO

## 2023-03-22 VITALS — BP 130/80 | HR 94 | Ht 68.0 in | Wt 193.0 lb

## 2023-03-22 DIAGNOSIS — K5010 Crohn's disease of large intestine without complications: Secondary | ICD-10-CM

## 2023-03-22 DIAGNOSIS — Z79899 Other long term (current) drug therapy: Secondary | ICD-10-CM | POA: Diagnosis not present

## 2023-03-22 DIAGNOSIS — J3081 Allergic rhinitis due to animal (cat) (dog) hair and dander: Secondary | ICD-10-CM | POA: Diagnosis not present

## 2023-03-22 DIAGNOSIS — J301 Allergic rhinitis due to pollen: Secondary | ICD-10-CM | POA: Diagnosis not present

## 2023-03-22 LAB — CBC WITH DIFFERENTIAL/PLATELET
Basophils Absolute: 0 10*3/uL (ref 0.0–0.1)
Basophils Relative: 0.8 % (ref 0.0–3.0)
Eosinophils Absolute: 0.1 10*3/uL (ref 0.0–0.7)
Eosinophils Relative: 3.1 % (ref 0.0–5.0)
HCT: 45 % (ref 39.0–52.0)
Hemoglobin: 14.7 g/dL (ref 13.0–17.0)
Lymphocytes Relative: 49.2 % — ABNORMAL HIGH (ref 12.0–46.0)
Lymphs Abs: 2.1 10*3/uL (ref 0.7–4.0)
MCHC: 32.7 g/dL (ref 30.0–36.0)
MCV: 94.7 fl (ref 78.0–100.0)
Monocytes Absolute: 0.3 10*3/uL (ref 0.1–1.0)
Monocytes Relative: 5.9 % (ref 3.0–12.0)
Neutro Abs: 1.7 10*3/uL (ref 1.4–7.7)
Neutrophils Relative %: 41 % — ABNORMAL LOW (ref 43.0–77.0)
Platelets: 321 10*3/uL (ref 150.0–400.0)
RBC: 4.75 Mil/uL (ref 4.22–5.81)
RDW: 12.4 % (ref 11.5–14.6)
WBC: 4.3 10*3/uL — ABNORMAL LOW (ref 4.5–10.5)

## 2023-03-22 LAB — COMPREHENSIVE METABOLIC PANEL
ALT: 14 U/L (ref 0–53)
AST: 14 U/L (ref 0–37)
Albumin: 4.7 g/dL (ref 3.5–5.2)
Alkaline Phosphatase: 44 U/L (ref 39–117)
BUN: 9 mg/dL (ref 6–23)
CO2: 25 meq/L (ref 19–32)
Calcium: 10.1 mg/dL (ref 8.4–10.5)
Chloride: 102 meq/L (ref 96–112)
Creatinine, Ser: 0.99 mg/dL (ref 0.40–1.50)
GFR: 109.37 mL/min (ref >60.00)
Glucose, Bld: 89 mg/dL (ref 70–99)
Potassium: 4.1 meq/L (ref 3.5–5.1)
Sodium: 138 meq/L (ref 135–145)
Total Bilirubin: 0.8 mg/dL (ref 0.2–1.2)
Total Protein: 7.9 g/dL (ref 6.0–8.3)

## 2023-03-22 MED ORDER — ADALIMUMAB 40 MG/0.8ML ~~LOC~~ AJKT
1.0000 | AUTO-INJECTOR | SUBCUTANEOUS | 3 refills | Status: DC
  Filled 2023-03-22: qty 12, 84d supply, fill #0

## 2023-03-22 MED ORDER — ADALIMUMAB 40 MG/0.8ML ~~LOC~~ AJKT
1.0000 | AUTO-INJECTOR | SUBCUTANEOUS | 3 refills | Status: DC
  Filled 2023-03-22: qty 12, 84d supply, fill #0
  Filled 2023-03-30: qty 4, 28d supply, fill #0
  Filled 2023-04-27: qty 4, 28d supply, fill #1
  Filled 2023-05-19: qty 4, 28d supply, fill #2
  Filled 2023-06-24: qty 4, 28d supply, fill #3
  Filled 2023-06-29 – 2023-07-29 (×2): qty 4, 28d supply, fill #4
  Filled 2023-08-22: qty 4, 28d supply, fill #5
  Filled 2023-09-21: qty 4, 28d supply, fill #6
  Filled 2023-10-21: qty 4, 28d supply, fill #7
  Filled 2023-11-22: qty 4, 28d supply, fill #8
  Filled 2023-12-23: qty 4, 28d supply, fill #9
  Filled 2024-02-03: qty 4, 28d supply, fill #10
  Filled 2024-03-07: qty 4, 28d supply, fill #11

## 2023-03-22 NOTE — Telephone Encounter (Signed)
Called and LM for patient. He left the office after his visit without getting his AVS. Dr. Adela Lank wanted him to go to the lab.  Gave him lab hours and requested that he come back and go to the lab today or one day this week

## 2023-03-22 NOTE — Patient Instructions (Addendum)
If your blood pressure at your visit was 140/90 or greater, please contact your primary care physician to follow up on this. ______________________________________________________  If you are age 21 or older, your body mass index should be between 23-30. Your Body mass index is 29.35 kg/m. If this is out of the aforementioned range listed, please consider follow up with your Primary Care Provider.  If you are age 70 or younger, your body mass index should be between 19-25. Your Body mass index is 29.35 kg/m. If this is out of the aformentioned range listed, please consider follow up with your Primary Care Provider.  ________________________________________________________  The [facility] providers would like to encourage you to use [facility] to communicate with providers for non-urgent requests or questions.  Due to long hold times on the telephone, sending your provider a message by [facility] may be a faster and more efficient way to get a response.  Please allow 48 business hours for a response.  Please remember that this is for non-urgent requests.  _______________________________________________________  Due to recent changes in healthcare laws, you may see the results of your imaging and laboratory studies on MyChart before your provider has had a chance to review them.  We understand that in some cases there may be results that are confusing or concerning to you. Not all laboratory results come back in the same time frame and the provider may be waiting for multiple results in order to interpret others.  Please give [place] 48 hours in order for your provider to thoroughly review all the results before contacting the office for clarification of your results.   Please go to the lab in the basement of our building to have lab work done as you leave today. Hit "B" for basement when you get on the elevator.  When the doors open the lab is on your left.  We will call you with the results. Thank you.  We  have sent the following medications to your pharmacy for you to pick up at your convenience: Humira - weekly  You will be due for labs in 6 months (Fecal calprotectin). We will remind you when it is time.   Thank you for entrusting me with your care and for choosing [facility], Dr. Ileene Patrick

## 2023-03-23 DIAGNOSIS — J3089 Other allergic rhinitis: Secondary | ICD-10-CM | POA: Diagnosis not present

## 2023-03-24 LAB — QUANTIFERON-TB GOLD PLUS
Mitogen-NIL: >10 [IU]/mL
NIL: 0.03 [IU]/mL
QuantiFERON-TB Gold Plus: NEGATIVE
TB1-NIL: 0.01 [IU]/mL
TB2-NIL: 0.03 [IU]/mL

## 2023-03-30 ENCOUNTER — Other Ambulatory Visit: Payer: Self-pay

## 2023-03-30 ENCOUNTER — Other Ambulatory Visit (HOSPITAL_COMMUNITY): Payer: Self-pay

## 2023-03-31 DIAGNOSIS — J301 Allergic rhinitis due to pollen: Secondary | ICD-10-CM | POA: Diagnosis not present

## 2023-03-31 DIAGNOSIS — J3081 Allergic rhinitis due to animal (cat) (dog) hair and dander: Secondary | ICD-10-CM | POA: Diagnosis not present

## 2023-03-31 DIAGNOSIS — J3089 Other allergic rhinitis: Secondary | ICD-10-CM | POA: Diagnosis not present

## 2023-04-01 ENCOUNTER — Other Ambulatory Visit (HOSPITAL_COMMUNITY): Payer: Self-pay

## 2023-04-12 DIAGNOSIS — J301 Allergic rhinitis due to pollen: Secondary | ICD-10-CM | POA: Diagnosis not present

## 2023-04-12 DIAGNOSIS — J3081 Allergic rhinitis due to animal (cat) (dog) hair and dander: Secondary | ICD-10-CM | POA: Diagnosis not present

## 2023-04-12 DIAGNOSIS — J3089 Other allergic rhinitis: Secondary | ICD-10-CM | POA: Diagnosis not present

## 2023-04-27 ENCOUNTER — Other Ambulatory Visit (HOSPITAL_COMMUNITY): Payer: Self-pay

## 2023-04-29 ENCOUNTER — Other Ambulatory Visit (HOSPITAL_COMMUNITY): Payer: Self-pay

## 2023-05-02 ENCOUNTER — Other Ambulatory Visit (HOSPITAL_COMMUNITY): Payer: Self-pay

## 2023-05-03 ENCOUNTER — Other Ambulatory Visit: Payer: Self-pay

## 2023-05-03 ENCOUNTER — Other Ambulatory Visit (HOSPITAL_COMMUNITY): Payer: Self-pay

## 2023-05-03 DIAGNOSIS — J3089 Other allergic rhinitis: Secondary | ICD-10-CM | POA: Diagnosis not present

## 2023-05-03 DIAGNOSIS — J3081 Allergic rhinitis due to animal (cat) (dog) hair and dander: Secondary | ICD-10-CM | POA: Diagnosis not present

## 2023-05-03 DIAGNOSIS — J301 Allergic rhinitis due to pollen: Secondary | ICD-10-CM | POA: Diagnosis not present

## 2023-05-04 ENCOUNTER — Other Ambulatory Visit (HOSPITAL_COMMUNITY): Payer: Self-pay

## 2023-05-05 ENCOUNTER — Other Ambulatory Visit: Payer: Self-pay

## 2023-05-05 ENCOUNTER — Telehealth: Payer: Self-pay | Admitting: Pharmacy Technician

## 2023-05-05 ENCOUNTER — Other Ambulatory Visit (HOSPITAL_COMMUNITY): Payer: Self-pay

## 2023-05-05 NOTE — Telephone Encounter (Signed)
Patient Advocate Encounter  Received notification from [facility] that prior authorization for HUMIRA 40MG  is required.   PA submitted on 7.11.24 Key [id number] Status is pending

## 2023-05-06 ENCOUNTER — Other Ambulatory Visit: Payer: Self-pay

## 2023-05-06 ENCOUNTER — Other Ambulatory Visit (HOSPITAL_COMMUNITY): Payer: Self-pay

## 2023-05-06 NOTE — Telephone Encounter (Addendum)
Patient Advocate Encounter  Prior Authorization for HUMIRA 40MG  has been approved with [employer].    PA# [id number] Effective dates: 7.11.24 through 7.10.25  Per [facility] test claim, UNABLE TO PROVIDE

## 2023-05-09 ENCOUNTER — Telehealth: Payer: Self-pay

## 2023-05-09 NOTE — Telephone Encounter (Signed)
LVM for patient to call back [phone], or to call PCP office to schedule follow up apt. AS, CMA  

## 2023-05-19 ENCOUNTER — Other Ambulatory Visit (HOSPITAL_COMMUNITY): Payer: Self-pay

## 2023-05-20 DIAGNOSIS — J301 Allergic rhinitis due to pollen: Secondary | ICD-10-CM | POA: Diagnosis not present

## 2023-05-20 DIAGNOSIS — J3089 Other allergic rhinitis: Secondary | ICD-10-CM | POA: Diagnosis not present

## 2023-05-20 DIAGNOSIS — J3081 Allergic rhinitis due to animal (cat) (dog) hair and dander: Secondary | ICD-10-CM | POA: Diagnosis not present

## 2023-05-27 ENCOUNTER — Encounter: Payer: Self-pay | Admitting: Pharmacist

## 2023-05-27 ENCOUNTER — Ambulatory Visit: Payer: Commercial Managed Care - PPO | Attending: Internal Medicine | Admitting: Pharmacist

## 2023-05-27 DIAGNOSIS — Z7189 Other specified counseling: Secondary | ICD-10-CM

## 2023-05-27 NOTE — Progress Notes (Signed)
  S: Patient presents for review of their specialty medication therapy.  Patient is about to start Humira for Crohn's disease. Patient is managed by Dr. Adela Lank for this.   Adherence: confirmed  Efficacy: recently increased frequency to 40 mg once weekly  Dosing:  Crohn disease: SubQ (may continue aminosalicylates and/or corticosteroids; if necessary, azathioprine, mercaptopurine, or methotrexate may also be continued): Maintenance: 40 mg every other week beginning day 29. Note: Some patients may require 40 mg every week as maintenance therapy Armanda Heritage 2009).  Dose adjustments: Renal: no dose adjustments (has not been studied) Hepatic: no dose adjustments (has not been studied)  Drug-drug interactions:none identified   Screening: TB test: negative Hepatitis:  - Hep B: Heb b Ab pos; surface Ag, core ag negative  Monitoring: S/sx of infection: none CBC: stable from 03/22/23 S/sx of hypersensitivity: none S/sx of malignancy: none S/sx of heart failure: none   Other side effects: none  O: Lab Results  Component Value Date   WBC 4.3 (L) 03/22/2023   HGB 14.7 03/22/2023   HCT 45.0 03/22/2023   MCV 94.7 03/22/2023   PLT 321.0 03/22/2023      Chemistry      Component Value Date/Time   NA 138 03/22/2023 1021   K 4.1 03/22/2023 1021   CL 102 03/22/2023 1021   CO2 25 03/22/2023 1021   BUN 9 03/22/2023 1021   CREATININE 0.99 03/22/2023 1021   CREATININE 0.75 09/10/2021 1448      Component Value Date/Time   CALCIUM 10.1 03/22/2023 1021   ALKPHOS 44 03/22/2023 1021   AST 14 03/22/2023 1021   ALT 14 03/22/2023 1021   BILITOT 0.8 03/22/2023 1021       A/P: 1. Medication review: Patient currently taking Humira for Crohn's disease. Reviewed the medication with the patient, including the following: Humira is a TNF blocking agent indicated for ankylosing spondylitis, Crohn's disease, Hidradenitis suppurativa, psoriatic arthritis, plaque psoriasis, ulcerative  colitis, and uveitis. Patient educated on purpose, proper use and potential adverse effects of Humira. Possible adverse effects are increased risk of infections, headache, and injection site reactions. There is the possibility of an increased risk of malignancy but it is not well understood if this increased risk is due to there medication or the disease state. There are rare cases of pancytopenia and aplastic anemia. For SubQ injection at separate sites in the thigh or lower abdomen (avoiding areas within 2 inches of navel); rotate injection sites. May leave at room temperature for ~15 to 30 minutes prior to use; do not remove cap or cover while allowing product to reach room temperature. Do not use if solution is discolored or contains particulate matter. Do not administer to skin which is red, tender, bruised, hard, or that has scars, stretch marks, or psoriasis plaques. Needle cap of the prefilled syringe or needle cover for the adalimumab pen may contain latex. Prefilled pens and syringes are available for use by patients and the full amount of the syringe should be injected (self-administration); the vial is intended for institutional use only. Vials do not contain a preservative; discard unused portion. No recommendations for any changes at this time.  Butch Penny, PharmD, P[place]y [place], CPP Clinical Pharmacist [facility] [phone]

## 2023-05-30 ENCOUNTER — Other Ambulatory Visit (HOSPITAL_COMMUNITY): Payer: Self-pay

## 2023-06-09 DIAGNOSIS — J3081 Allergic rhinitis due to animal (cat) (dog) hair and dander: Secondary | ICD-10-CM | POA: Diagnosis not present

## 2023-06-09 DIAGNOSIS — J301 Allergic rhinitis due to pollen: Secondary | ICD-10-CM | POA: Diagnosis not present

## 2023-06-09 DIAGNOSIS — J3089 Other allergic rhinitis: Secondary | ICD-10-CM | POA: Diagnosis not present

## 2023-06-15 DIAGNOSIS — J3081 Allergic rhinitis due to animal (cat) (dog) hair and dander: Secondary | ICD-10-CM | POA: Diagnosis not present

## 2023-06-15 DIAGNOSIS — J301 Allergic rhinitis due to pollen: Secondary | ICD-10-CM | POA: Diagnosis not present

## 2023-06-15 DIAGNOSIS — J3089 Other allergic rhinitis: Secondary | ICD-10-CM | POA: Diagnosis not present

## 2023-06-21 ENCOUNTER — Other Ambulatory Visit (HOSPITAL_COMMUNITY): Payer: Self-pay

## 2023-06-21 DIAGNOSIS — J3089 Other allergic rhinitis: Secondary | ICD-10-CM | POA: Diagnosis not present

## 2023-06-21 DIAGNOSIS — J301 Allergic rhinitis due to pollen: Secondary | ICD-10-CM | POA: Diagnosis not present

## 2023-06-21 DIAGNOSIS — J3081 Allergic rhinitis due to animal (cat) (dog) hair and dander: Secondary | ICD-10-CM | POA: Diagnosis not present

## 2023-06-24 ENCOUNTER — Other Ambulatory Visit (HOSPITAL_COMMUNITY): Payer: Self-pay

## 2023-06-29 ENCOUNTER — Other Ambulatory Visit (HOSPITAL_COMMUNITY): Payer: Self-pay

## 2023-06-29 DIAGNOSIS — J3081 Allergic rhinitis due to animal (cat) (dog) hair and dander: Secondary | ICD-10-CM | POA: Diagnosis not present

## 2023-06-29 DIAGNOSIS — J301 Allergic rhinitis due to pollen: Secondary | ICD-10-CM | POA: Diagnosis not present

## 2023-06-29 DIAGNOSIS — J3089 Other allergic rhinitis: Secondary | ICD-10-CM | POA: Diagnosis not present

## 2023-06-29 MED ORDER — FINASTERIDE 1 MG PO TABS
1.0000 mg | ORAL_TABLET | Freq: Every day | ORAL | 3 refills | Status: AC
Start: 2023-06-29 — End: ?
  Filled 2023-06-29 – 2023-06-30 (×2): qty 90, 90d supply, fill #0
  Filled 2024-02-15: qty 90, 90d supply, fill #1

## 2023-06-29 MED ORDER — EPINEPHRINE 0.3 MG/0.3ML IJ SOAJ
INTRAMUSCULAR | 1 refills | Status: AC
  Filled 2023-06-29: qty 2, 30d supply, fill #0

## 2023-06-30 ENCOUNTER — Other Ambulatory Visit (HOSPITAL_COMMUNITY): Payer: Self-pay

## 2023-06-30 ENCOUNTER — Other Ambulatory Visit: Payer: Self-pay

## 2023-07-01 ENCOUNTER — Other Ambulatory Visit: Payer: Self-pay

## 2023-07-01 ENCOUNTER — Other Ambulatory Visit (HOSPITAL_COMMUNITY): Payer: Self-pay

## 2023-07-01 MED ORDER — TRETINOIN 0.05 % EX CREA
TOPICAL_CREAM | CUTANEOUS | 2 refills | Status: DC
  Filled 2023-07-01: qty 20, 30d supply, fill #0

## 2023-07-13 DIAGNOSIS — J3081 Allergic rhinitis due to animal (cat) (dog) hair and dander: Secondary | ICD-10-CM | POA: Diagnosis not present

## 2023-07-13 DIAGNOSIS — J301 Allergic rhinitis due to pollen: Secondary | ICD-10-CM | POA: Diagnosis not present

## 2023-07-13 DIAGNOSIS — J3089 Other allergic rhinitis: Secondary | ICD-10-CM | POA: Diagnosis not present

## 2023-07-16 ENCOUNTER — Other Ambulatory Visit (HOSPITAL_COMMUNITY): Payer: Self-pay

## 2023-07-18 ENCOUNTER — Encounter: Payer: Self-pay | Admitting: Internal Medicine

## 2023-07-18 ENCOUNTER — Ambulatory Visit (INDEPENDENT_AMBULATORY_CARE_PROVIDER_SITE_OTHER): Payer: Commercial Managed Care - PPO | Admitting: Internal Medicine

## 2023-07-18 VITALS — BP 122/76 | HR 110 | Temp 97.9°F | Resp 16 | Ht 68.0 in | Wt 199.0 lb

## 2023-07-18 DIAGNOSIS — Z23 Encounter for immunization: Secondary | ICD-10-CM | POA: Diagnosis not present

## 2023-07-18 DIAGNOSIS — Z0000 Encounter for general adult medical examination without abnormal findings: Secondary | ICD-10-CM

## 2023-07-18 DIAGNOSIS — Z0001 Encounter for general adult medical examination with abnormal findings: Secondary | ICD-10-CM

## 2023-07-18 DIAGNOSIS — R000 Tachycardia, unspecified: Secondary | ICD-10-CM | POA: Diagnosis not present

## 2023-07-18 DIAGNOSIS — K5090 Crohn's disease, unspecified, without complications: Secondary | ICD-10-CM

## 2023-07-18 LAB — CBC WITH DIFFERENTIAL/PLATELET
Basophils Absolute: 0.1 10*3/uL (ref 0.0–0.1)
Basophils Relative: 0.6 % (ref 0.0–3.0)
Eosinophils Absolute: 0.2 10*3/uL (ref 0.0–0.7)
Eosinophils Relative: 1.9 % (ref 0.0–5.0)
HCT: 42.7 % (ref 39.0–52.0)
Hemoglobin: 14.1 g/dL (ref 13.0–17.0)
Lymphocytes Relative: 27.3 % (ref 12.0–46.0)
Lymphs Abs: 2.3 10*3/uL (ref 0.7–4.0)
MCHC: 33 g/dL (ref 30.0–36.0)
MCV: 93.5 fl (ref 78.0–100.0)
Monocytes Absolute: 0.6 10*3/uL (ref 0.1–1.0)
Monocytes Relative: 7.4 % (ref 3.0–12.0)
Neutro Abs: 5.3 10*3/uL (ref 1.4–7.7)
Neutrophils Relative %: 62.8 % (ref 43.0–77.0)
Platelets: 348 10*3/uL (ref 150.0–400.0)
RBC: 4.56 Mil/uL (ref 4.22–5.81)
RDW: 12.2 % (ref 11.5–15.5)
WBC: 8.4 10*3/uL (ref 4.0–10.5)

## 2023-07-18 LAB — BASIC METABOLIC PANEL
BUN: 10 mg/dL (ref 6–23)
CO2: 26 meq/L (ref 19–32)
Calcium: 9.8 mg/dL (ref 8.4–10.5)
Chloride: 103 meq/L (ref 96–112)
Creatinine, Ser: 0.87 mg/dL (ref 0.40–1.50)
GFR: 123.6 mL/min (ref >60.00)
Glucose, Bld: 88 mg/dL (ref 70–99)
Potassium: 4 meq/L (ref 3.5–5.1)
Sodium: 138 meq/L (ref 135–145)

## 2023-07-18 NOTE — Patient Instructions (Signed)
Health Maintenance, Male Adopting a healthy lifestyle and getting preventive care are important in promoting health and wellness. Ask your health care provider about: The right schedule for you to have regular tests and exams. Things you can do on your own to prevent diseases and keep yourself healthy. What should I know about diet, weight, and exercise? Eat a healthy diet  Eat a diet that includes plenty of vegetables, fruits, low-fat dairy products, and lean protein. Do not eat a lot of foods that are high in solid fats, added sugars, or sodium. Maintain a healthy weight Body mass index (BMI) is a measurement that can be used to identify possible weight problems. It estimates body fat based on height and weight. Your health care provider can help determine your BMI and help you achieve or maintain a healthy weight. Get regular exercise Get regular exercise. This is one of the most important things you can do for your health. Most adults should: Exercise for at least 150 minutes each week. The exercise should increase your heart rate and make you sweat (moderate-intensity exercise). Do strengthening exercises at least twice a week. This is in addition to the moderate-intensity exercise. Spend less time sitting. Even light physical activity can be beneficial. Watch cholesterol and blood lipids Have your blood tested for lipids and cholesterol at 20 years of age, then have this test every 5 years. You may need to have your cholesterol levels checked more often if: Your lipid or cholesterol levels are high. You are older than 21 years of age. You are at high risk for heart disease. What should I know about cancer screening? Many types of cancers can be detected early and may often be prevented. Depending on your health history and family history, you may need to have cancer screening at various ages. This may include screening for: Colorectal cancer. Prostate cancer. Skin cancer. Lung  cancer. What should I know about heart disease, diabetes, and high blood pressure? Blood pressure and heart disease High blood pressure causes heart disease and increases the risk of stroke. This is more likely to develop in people who have high blood pressure readings or are overweight. Talk with your health care provider about your target blood pressure readings. Have your blood pressure checked: Every 3-5 years if you are 18-39 years of age. Every year if you are 40 years old or older. If you are between the ages of 65 and 75 and are a current or former smoker, ask your health care provider if you should have a one-time screening for abdominal aortic aneurysm (AAA). Diabetes Have regular diabetes screenings. This checks your fasting blood sugar level. Have the screening done: Once every three years after age 45 if you are at a normal weight and have a low risk for diabetes. More often and at a younger age if you are overweight or have a high risk for diabetes. What should I know about preventing infection? Hepatitis B If you have a higher risk for hepatitis B, you should be screened for this virus. Talk with your health care provider to find out if you are at risk for hepatitis B infection. Hepatitis C Blood testing is recommended for: Everyone born from 1945 through 1965. Anyone with known risk factors for hepatitis C. Sexually transmitted infections (STIs) You should be screened each year for STIs, including gonorrhea and chlamydia, if: You are sexually active and are younger than 21 years of age. You are older than 21 years of age and your   health care provider tells you that you are at risk for this type of infection. Your sexual activity has changed since you were last screened, and you are at increased risk for chlamydia or gonorrhea. Ask your health care provider if you are at risk. Ask your health care provider about whether you are at high risk for HIV. Your health care provider  may recommend a prescription medicine to help prevent HIV infection. If you choose to take medicine to prevent HIV, you should first get tested for HIV. You should then be tested every 3 months for as long as you are taking the medicine. Follow these instructions at home: Alcohol use Do not drink alcohol if your health care provider tells you not to drink. If you drink alcohol: Limit how much you have to 0-2 drinks a day. Know how much alcohol is in your drink. In the [place] one drink equals one 12 oz bottle of beer (355 mL), one 5 oz glass of wine (148 mL), or one 1 oz glass of hard liquor (44 mL). Lifestyle Do not use any products that contain nicotine or tobacco. These products include cigarettes, chewing tobacco, and vaping devices, such as e-cigarettes. If you need help quitting, ask your health care provider. Do not use street drugs. Do not share needles. Ask your health care provider for help if you need support or information about quitting drugs. General instructions Schedule regular health, dental, and eye exams. Stay current with your vaccines. Tell your health care provider if: You often feel depressed. You have ever been abused or do not feel safe at home. Summary Adopting a healthy lifestyle and getting preventive care are important in promoting health and wellness. Follow your health care provider's instructions about healthy diet, exercising, and getting tested or screened for diseases. Follow your health care provider's instructions on monitoring your cholesterol and blood pressure. This information is not intended to replace advice given to you by your health care provider. Make sure you discuss any questions you have with your health care provider. Document Revised: 03/02/2021 Document Reviewed: 03/02/2021 Elsevier Patient Education  2024 Elsevier Inc.  

## 2023-07-18 NOTE — Progress Notes (Unsigned)
Subjective:  Patient ID: Billy Allen, male    DOB: 2001/01/10  Age: 22 y.o. MRN: [id number]  CC: Annual Exam   HPI Billy Allen presents for a CPX and f/up ---    Discussed the use of AI scribe software for clinical note transcription with the patient, who gave verbal consent to proceed.  History of Present Illness   The patient, a recent college graduate, presents for a routine check-up and vaccinations. They report feeling generally well, but express feelings of monotony and uncertainty related to their current life situation. They are currently studying for the MCAT and working as a Lawyer at [employer]. They report no debilitating issues with concentration or focus.  The patient reports no significant allergy symptoms and exercises regularly, recently swimming prior to the appointment. They deny any chest or abdominal pain, and no signs of testicular cancer.       Outpatient Medications Prior to Visit  Medication Sig Dispense Refill   adalimumab (HUMIRA) 40 MG/0.8ML PNKT pen Inject 1 Pen into the skin once a week. 12 each 3   Cholecalciferol 125 MCG (5000 UT) capsule Take by mouth.     EPINEPHrine (EPIPEN 2-PAK) 0.3 mg/0.3 mL IJ SOAJ injection Inject one epipen into the thigh muscle as needed for systemic reaction. May repeat dose once if needed 2 each 1   EPINEPHrine 0.3 mg/0.3 mL IJ SOAJ injection Inject into the muscle as directed.     finasteride (PROPECIA) 1 MG tablet Take 1 tablet (1 mg total) by mouth daily. 90 tablet 3   fluticasone (FLONASE) 50 MCG/ACT nasal spray Use 2 sprays into both nostrils daily 16 g 5   loratadine (CLARITIN REDITABS) 10 MG dissolvable tablet Take by mouth.     tacrolimus (PROTOPIC) 0.1 % ointment Apply 1 Dose topically as needed.     tretinoin (RETIN-A) 0.05 % cream Apply on the skin nightly; Use a few nights a week and increase to nightly as tolerated. 20 g 2   tretinoin (RETIN-A) 0.05 % cream 1 application on the skin NIGHTLY Use a few nights a week  and increase to nightly as tolerated. 20 g 2   triamcinolone cream (KENALOG) 0.1 % Apply 1 gram to skin twice a day 908 g 2   No facility-administered medications prior to visit.    ROS Review of Systems  Objective:  BP 122/76 (BP Location: Left Arm, Patient Position: Sitting, Cuff Size: Large)   Pulse (!) 110   Temp 97.9 F (36.6 C) (Oral)   Resp 16   Ht 5\' 8"  (1.727 m)   Wt 199 lb (90.3 kg)   SpO2 96%   BMI 30.26 kg/m   BP Readings from Last 3 Encounters:  07/18/23 122/76  03/22/23 130/80  07/15/22 128/78    Wt Readings from Last 3 Encounters:  07/18/23 199 lb (90.3 kg)  03/22/23 193 lb (87.5 kg)  07/15/22 194 lb (88 kg)    Physical Exam Cardiovascular:     Rate and Rhythm: Regular rhythm. Tachycardia present.     Heart sounds: Normal heart sounds, S1 normal and S2 normal.     Comments: EKG - NSR, 99 bpm No LVH, Q waves, or ST/T waves  Musculoskeletal:     Right lower leg: No edema.     Left lower leg: No edema.     Lab Results  Component Value Date   WBC 8.4 07/18/2023   HGB 14.1 07/18/2023   HCT 42.7 07/18/2023   PLT  348.0 07/18/2023   GLUCOSE 88 07/18/2023   ALT 14 03/22/2023   AST 14 03/22/2023   NA 138 07/18/2023   K 4.0 07/18/2023   CL 103 07/18/2023   CREATININE 0.87 07/18/2023   BUN 10 07/18/2023   CO2 26 07/18/2023   INR 1.2 11/24/2019    No results found.  Assessment & Plan:  Tachycardia -     Thyroid Panel With TSH; Future -     Basic metabolic panel; Future -     CBC with Differential/Platelet; Future -     EKG 12-Lead  Crohn's disease without complication, unspecified gastrointestinal tract location [facility]) -     Basic metabolic panel; Future -     CBC with Differential/Platelet; Future  Encounter for general adult medical examination with abnormal findings  Flu vaccine need -     Flu vaccine trivalent PF, 6mos and older(Flulaval,Afluria,Fluarix,Fluzone)  Other orders -     Tdap vaccine greater than or equal to 7yo IM      Follow-up: Return in about 1 year (around 07/17/2024).  Sanda Linger, MD

## 2023-07-19 LAB — THYROID PANEL WITH TSH
Free Thyroxine Index: 2.5 (ref 1.4–3.8)
T3 Uptake: 33 % (ref 22–35)
T4, Total: 7.7 ug/dL (ref 4.9–10.5)
TSH: 0.71 m[IU]/L (ref 0.40–4.50)

## 2023-07-20 DIAGNOSIS — J3089 Other allergic rhinitis: Secondary | ICD-10-CM | POA: Diagnosis not present

## 2023-07-20 DIAGNOSIS — J301 Allergic rhinitis due to pollen: Secondary | ICD-10-CM | POA: Diagnosis not present

## 2023-07-20 DIAGNOSIS — J3081 Allergic rhinitis due to animal (cat) (dog) hair and dander: Secondary | ICD-10-CM | POA: Diagnosis not present

## 2023-07-26 ENCOUNTER — Other Ambulatory Visit: Payer: Self-pay

## 2023-07-28 DIAGNOSIS — J3081 Allergic rhinitis due to animal (cat) (dog) hair and dander: Secondary | ICD-10-CM | POA: Diagnosis not present

## 2023-07-28 DIAGNOSIS — J3089 Other allergic rhinitis: Secondary | ICD-10-CM | POA: Diagnosis not present

## 2023-07-28 DIAGNOSIS — J301 Allergic rhinitis due to pollen: Secondary | ICD-10-CM | POA: Diagnosis not present

## 2023-07-29 ENCOUNTER — Other Ambulatory Visit: Payer: Self-pay

## 2023-07-29 NOTE — Progress Notes (Signed)
Specialty Pharmacy Refill Coordination Note  Billy Allen is a 22 y.o. male contacted today regarding refills of specialty medication(s) Adalimumab   Patient requested Delivery   Delivery date: 08/02/23   Verified address: [address]  [name] [id number]   Medication will be filled on 08/01/23.

## 2023-08-01 DIAGNOSIS — J3081 Allergic rhinitis due to animal (cat) (dog) hair and dander: Secondary | ICD-10-CM | POA: Diagnosis not present

## 2023-08-01 DIAGNOSIS — J301 Allergic rhinitis due to pollen: Secondary | ICD-10-CM | POA: Diagnosis not present

## 2023-08-01 DIAGNOSIS — J3089 Other allergic rhinitis: Secondary | ICD-10-CM | POA: Diagnosis not present

## 2023-08-04 DIAGNOSIS — L218 Other seborrheic dermatitis: Secondary | ICD-10-CM | POA: Diagnosis not present

## 2023-08-04 DIAGNOSIS — L700 Acne vulgaris: Secondary | ICD-10-CM | POA: Diagnosis not present

## 2023-08-04 DIAGNOSIS — J3081 Allergic rhinitis due to animal (cat) (dog) hair and dander: Secondary | ICD-10-CM | POA: Diagnosis not present

## 2023-08-04 DIAGNOSIS — J301 Allergic rhinitis due to pollen: Secondary | ICD-10-CM | POA: Diagnosis not present

## 2023-08-04 DIAGNOSIS — J3089 Other allergic rhinitis: Secondary | ICD-10-CM | POA: Diagnosis not present

## 2023-08-04 DIAGNOSIS — L2089 Other atopic dermatitis: Secondary | ICD-10-CM | POA: Diagnosis not present

## 2023-08-05 ENCOUNTER — Other Ambulatory Visit (HOSPITAL_COMMUNITY): Payer: Self-pay

## 2023-08-05 ENCOUNTER — Other Ambulatory Visit: Payer: Self-pay

## 2023-08-05 MED ORDER — FLUOCINOLONE ACETONIDE BODY 0.01 % EX OIL
1.0000 | TOPICAL_OIL | Freq: Every day | CUTANEOUS | 3 refills | Status: DC
  Filled 2023-08-05: qty 118.28, 30d supply, fill #0

## 2023-08-05 MED ORDER — METRONIDAZOLE 0.75 % EX GEL
1.0000 | Freq: Two times a day (BID) | CUTANEOUS | 2 refills | Status: DC
  Filled 2023-08-05: qty 45, 30d supply, fill #0

## 2023-08-05 MED ORDER — TRIAMCINOLONE ACETONIDE 0.1 % EX CREA
1.0000 | TOPICAL_CREAM | Freq: Two times a day (BID) | CUTANEOUS | 2 refills | Status: DC
  Filled 2023-08-05: qty 454, 90d supply, fill #0

## 2023-08-08 ENCOUNTER — Other Ambulatory Visit: Payer: Self-pay

## 2023-08-08 ENCOUNTER — Other Ambulatory Visit (HOSPITAL_COMMUNITY): Payer: Self-pay

## 2023-08-08 ENCOUNTER — Ambulatory Visit: Payer: Commercial Managed Care - PPO | Admitting: Internal Medicine

## 2023-08-08 DIAGNOSIS — K50919 Crohn's disease, unspecified, with unspecified complications: Secondary | ICD-10-CM | POA: Diagnosis not present

## 2023-08-08 DIAGNOSIS — K610 Anal abscess: Secondary | ICD-10-CM | POA: Diagnosis not present

## 2023-08-08 MED ORDER — HYDROCODONE-ACETAMINOPHEN 5-325 MG PO TABS
1.0000 | ORAL_TABLET | Freq: Four times a day (QID) | ORAL | 0 refills | Status: DC | PRN
Start: 2023-08-08 — End: 2023-12-08
  Filled 2023-08-08: qty 5, 2d supply, fill #0

## 2023-08-08 MED ORDER — AMOXICILLIN-POT CLAVULANATE 875-125 MG PO TABS
1.0000 | ORAL_TABLET | Freq: Two times a day (BID) | ORAL | 0 refills | Status: AC
  Filled 2023-08-08: qty 10, 5d supply, fill #0

## 2023-08-09 ENCOUNTER — Other Ambulatory Visit (HOSPITAL_COMMUNITY): Payer: Self-pay

## 2023-08-10 ENCOUNTER — Encounter: Payer: Self-pay | Admitting: Internal Medicine

## 2023-08-17 DIAGNOSIS — J3081 Allergic rhinitis due to animal (cat) (dog) hair and dander: Secondary | ICD-10-CM | POA: Diagnosis not present

## 2023-08-17 DIAGNOSIS — J301 Allergic rhinitis due to pollen: Secondary | ICD-10-CM | POA: Diagnosis not present

## 2023-08-17 DIAGNOSIS — J3089 Other allergic rhinitis: Secondary | ICD-10-CM | POA: Diagnosis not present

## 2023-08-18 ENCOUNTER — Other Ambulatory Visit: Payer: Self-pay

## 2023-08-22 ENCOUNTER — Other Ambulatory Visit: Payer: Self-pay

## 2023-08-22 ENCOUNTER — Telehealth: Payer: Self-pay

## 2023-08-22 DIAGNOSIS — K5010 Crohn's disease of large intestine without complications: Secondary | ICD-10-CM

## 2023-08-22 DIAGNOSIS — Z79899 Other long term (current) drug therapy: Secondary | ICD-10-CM

## 2023-08-22 NOTE — Telephone Encounter (Signed)
-----   Message from [facility] [place] [facility] sent at 03/22/2023 10:18 AM EDT ----- Regarding: fecal calprotectin Due for fecal calprotectin in November

## 2023-08-22 NOTE — Telephone Encounter (Signed)
MyChart message to patient to go to the lab for stool test

## 2023-08-22 NOTE — Progress Notes (Signed)
Specialty Pharmacy Refill Coordination Note  Billy Allen is a 22 y.o. male contacted today regarding refills of specialty medication(s) Adalimumab   Patient requested Delivery   Delivery date: 08/25/23   Verified address: [address] [place] [address]   Medication will be filled on 08/24/23.

## 2023-08-25 ENCOUNTER — Other Ambulatory Visit: Payer: Self-pay

## 2023-08-26 ENCOUNTER — Other Ambulatory Visit (HOSPITAL_BASED_OUTPATIENT_CLINIC_OR_DEPARTMENT_OTHER): Payer: Self-pay

## 2023-08-26 MED ORDER — COVID-19 MRNA VAC-TRIS(PFIZER) 30 MCG/0.3ML IM SUSY
0.3000 mL | PREFILLED_SYRINGE | Freq: Once | INTRAMUSCULAR | 0 refills | Status: AC
  Filled 2023-08-26: qty 0.3, 1d supply, fill #0

## 2023-09-02 DIAGNOSIS — J3089 Other allergic rhinitis: Secondary | ICD-10-CM | POA: Diagnosis not present

## 2023-09-02 DIAGNOSIS — J3081 Allergic rhinitis due to animal (cat) (dog) hair and dander: Secondary | ICD-10-CM | POA: Diagnosis not present

## 2023-09-02 DIAGNOSIS — J301 Allergic rhinitis due to pollen: Secondary | ICD-10-CM | POA: Diagnosis not present

## 2023-09-09 DIAGNOSIS — J3081 Allergic rhinitis due to animal (cat) (dog) hair and dander: Secondary | ICD-10-CM | POA: Diagnosis not present

## 2023-09-09 DIAGNOSIS — J3089 Other allergic rhinitis: Secondary | ICD-10-CM | POA: Diagnosis not present

## 2023-09-09 DIAGNOSIS — J301 Allergic rhinitis due to pollen: Secondary | ICD-10-CM | POA: Diagnosis not present

## 2023-09-21 ENCOUNTER — Other Ambulatory Visit (HOSPITAL_COMMUNITY): Payer: Self-pay

## 2023-09-21 ENCOUNTER — Other Ambulatory Visit (HOSPITAL_COMMUNITY): Payer: Self-pay | Admitting: Pharmacy Technician

## 2023-09-21 ENCOUNTER — Other Ambulatory Visit: Payer: Commercial Managed Care - PPO

## 2023-09-21 DIAGNOSIS — J3089 Other allergic rhinitis: Secondary | ICD-10-CM | POA: Diagnosis not present

## 2023-09-21 DIAGNOSIS — Z79899 Other long term (current) drug therapy: Secondary | ICD-10-CM

## 2023-09-21 DIAGNOSIS — K5010 Crohn's disease of large intestine without complications: Secondary | ICD-10-CM | POA: Diagnosis not present

## 2023-09-21 DIAGNOSIS — J301 Allergic rhinitis due to pollen: Secondary | ICD-10-CM | POA: Diagnosis not present

## 2023-09-21 DIAGNOSIS — J3081 Allergic rhinitis due to animal (cat) (dog) hair and dander: Secondary | ICD-10-CM | POA: Diagnosis not present

## 2023-09-21 NOTE — Progress Notes (Signed)
Specialty Pharmacy Refill Coordination Note  Billy Allen is a 22 y.o. male contacted today regarding refills of specialty medication(s) Adalimumab   Patient requested Delivery   Delivery date: 10/05/23   Verified address: [address]  [place]   Medication will be filled on 10/04/23.

## 2023-09-24 LAB — CALPROTECTIN, FECAL: Calprotectin, Fecal: 159 ug/g — ABNORMAL HIGH (ref 0–120)

## 2023-09-30 ENCOUNTER — Other Ambulatory Visit (HOSPITAL_COMMUNITY): Payer: Self-pay

## 2023-09-30 ENCOUNTER — Other Ambulatory Visit: Payer: Self-pay

## 2023-09-30 DIAGNOSIS — L2089 Other atopic dermatitis: Secondary | ICD-10-CM | POA: Diagnosis not present

## 2023-09-30 DIAGNOSIS — J3089 Other allergic rhinitis: Secondary | ICD-10-CM | POA: Diagnosis not present

## 2023-09-30 DIAGNOSIS — J301 Allergic rhinitis due to pollen: Secondary | ICD-10-CM | POA: Diagnosis not present

## 2023-09-30 DIAGNOSIS — J3081 Allergic rhinitis due to animal (cat) (dog) hair and dander: Secondary | ICD-10-CM | POA: Diagnosis not present

## 2023-09-30 MED ORDER — TACROLIMUS 0.1 % EX OINT
1.0000 | TOPICAL_OINTMENT | Freq: Every day | CUTANEOUS | 5 refills | Status: DC
  Filled 2023-09-30: qty 60, 30d supply, fill #0

## 2023-10-03 ENCOUNTER — Other Ambulatory Visit: Payer: Self-pay

## 2023-10-04 ENCOUNTER — Other Ambulatory Visit: Payer: Self-pay

## 2023-10-07 DIAGNOSIS — J3081 Allergic rhinitis due to animal (cat) (dog) hair and dander: Secondary | ICD-10-CM | POA: Diagnosis not present

## 2023-10-07 DIAGNOSIS — J301 Allergic rhinitis due to pollen: Secondary | ICD-10-CM | POA: Diagnosis not present

## 2023-10-07 DIAGNOSIS — J3089 Other allergic rhinitis: Secondary | ICD-10-CM | POA: Diagnosis not present

## 2023-10-21 ENCOUNTER — Other Ambulatory Visit: Payer: Self-pay

## 2023-10-21 ENCOUNTER — Other Ambulatory Visit (HOSPITAL_COMMUNITY): Payer: Self-pay

## 2023-10-21 NOTE — Progress Notes (Signed)
Specialty Pharmacy Ongoing Clinical Assessment Note  Billy Allen is a 22 y.o. male who is being followed by the specialty pharmacy service for RxSp Crohn's Disease   Patient's specialty medication(s) reviewed today: Adalimumab (HUMIRA)   Missed doses in the last 4 weeks: 0   Patient/Caregiver did not have any additional questions or concerns.   Therapeutic benefit summary: Patient is achieving benefit   Adverse events/side effects summary: No adverse events/side effects   Patient's therapy is appropriate to: Continue    Goals Addressed             This Visit's Progress    Minimize recurrence of flares       Patient is on track. Patient will maintain adherence         Follow up:  6 months  Bobette Mo Specialty Pharmacist

## 2023-10-21 NOTE — Progress Notes (Signed)
Specialty Pharmacy Refill Coordination Note  Billy Allen is a 22 y.o. male contacted today regarding refills of specialty medication(s) Adalimumab [name]   Patient requested Delivery   Delivery date: 10/28/23   Verified address: [address]  [place]   Medication will be filled on 01.02.25.

## 2023-10-27 ENCOUNTER — Other Ambulatory Visit: Payer: Self-pay

## 2023-10-28 DIAGNOSIS — J3089 Other allergic rhinitis: Secondary | ICD-10-CM | POA: Diagnosis not present

## 2023-10-28 DIAGNOSIS — J301 Allergic rhinitis due to pollen: Secondary | ICD-10-CM | POA: Diagnosis not present

## 2023-10-28 DIAGNOSIS — J3081 Allergic rhinitis due to animal (cat) (dog) hair and dander: Secondary | ICD-10-CM | POA: Diagnosis not present

## 2023-11-09 ENCOUNTER — Ambulatory Visit: Payer: Commercial Managed Care - PPO | Admitting: Gastroenterology

## 2023-11-18 DIAGNOSIS — J301 Allergic rhinitis due to pollen: Secondary | ICD-10-CM | POA: Diagnosis not present

## 2023-11-18 DIAGNOSIS — J3089 Other allergic rhinitis: Secondary | ICD-10-CM | POA: Diagnosis not present

## 2023-11-18 DIAGNOSIS — J3081 Allergic rhinitis due to animal (cat) (dog) hair and dander: Secondary | ICD-10-CM | POA: Diagnosis not present

## 2023-11-22 ENCOUNTER — Other Ambulatory Visit: Payer: Self-pay

## 2023-11-22 NOTE — Progress Notes (Signed)
Specialty Pharmacy Refill Coordination Note  Billy Allen is a 23 y.o. male contacted today regarding refills of specialty medication(s) Adalimumab [name]   Patient requested Delivery   Delivery date: 11/29/23   Verified address: [address]  [place]   Medication will be filled on 11/28/23.

## 2023-11-30 ENCOUNTER — Other Ambulatory Visit (HOSPITAL_COMMUNITY): Payer: Self-pay

## 2023-11-30 DIAGNOSIS — H52223 Regular astigmatism, bilateral: Secondary | ICD-10-CM | POA: Diagnosis not present

## 2023-11-30 DIAGNOSIS — J301 Allergic rhinitis due to pollen: Secondary | ICD-10-CM | POA: Diagnosis not present

## 2023-11-30 DIAGNOSIS — J3089 Other allergic rhinitis: Secondary | ICD-10-CM | POA: Diagnosis not present

## 2023-11-30 DIAGNOSIS — H5213 Myopia, bilateral: Secondary | ICD-10-CM | POA: Diagnosis not present

## 2023-11-30 DIAGNOSIS — J3081 Allergic rhinitis due to animal (cat) (dog) hair and dander: Secondary | ICD-10-CM | POA: Diagnosis not present

## 2023-11-30 DIAGNOSIS — Z135 Encounter for screening for eye and ear disorders: Secondary | ICD-10-CM | POA: Diagnosis not present

## 2023-11-30 MED ORDER — EPINEPHRINE 0.3 MG/0.3ML IJ SOAJ
INTRAMUSCULAR | 1 refills | Status: DC
  Filled 2023-11-30: qty 2, 30d supply, fill #0

## 2023-12-01 ENCOUNTER — Other Ambulatory Visit (HOSPITAL_COMMUNITY): Payer: Self-pay

## 2023-12-07 DIAGNOSIS — J301 Allergic rhinitis due to pollen: Secondary | ICD-10-CM | POA: Diagnosis not present

## 2023-12-07 DIAGNOSIS — J3081 Allergic rhinitis due to animal (cat) (dog) hair and dander: Secondary | ICD-10-CM | POA: Diagnosis not present

## 2023-12-07 DIAGNOSIS — J3089 Other allergic rhinitis: Secondary | ICD-10-CM | POA: Diagnosis not present

## 2023-12-08 ENCOUNTER — Ambulatory Visit: Payer: Commercial Managed Care - PPO | Admitting: Family Medicine

## 2023-12-08 ENCOUNTER — Encounter: Payer: Self-pay | Admitting: Family Medicine

## 2023-12-08 ENCOUNTER — Telehealth: Payer: Self-pay | Admitting: Gastroenterology

## 2023-12-08 ENCOUNTER — Other Ambulatory Visit (HOSPITAL_COMMUNITY): Payer: Self-pay

## 2023-12-08 VITALS — BP 118/82 | HR 93 | Temp 98.0°F | Ht 68.0 in | Wt 201.0 lb

## 2023-12-08 DIAGNOSIS — F418 Other specified anxiety disorders: Secondary | ICD-10-CM | POA: Diagnosis not present

## 2023-12-08 DIAGNOSIS — R000 Tachycardia, unspecified: Secondary | ICD-10-CM

## 2023-12-08 MED ORDER — METOPROLOL SUCCINATE ER 25 MG PO TB24
25.0000 mg | ORAL_TABLET | Freq: Every day | ORAL | 1 refills | Status: DC
  Filled 2023-12-08: qty 30, 30d supply, fill #0

## 2023-12-08 MED ORDER — ESCITALOPRAM OXALATE 5 MG PO TABS
5.0000 mg | ORAL_TABLET | Freq: Every day | ORAL | 1 refills | Status: DC
  Filled 2023-12-08: qty 30, 30d supply, fill #0

## 2023-12-08 NOTE — Telephone Encounter (Signed)
I spoke with the pt and advised that I see no orders for any labs to be completed prior to his upcoming appt. He will keep f/u appt as planned.

## 2023-12-08 NOTE — Progress Notes (Signed)
Subjective:     Patient ID: Billy Allen, male    DOB: Jul 30, 2001, 23 y.o.   MRN: [id number]  Chief Complaint  Patient presents with   Anxiety    Anxiety Symptoms include nervous/anxious behavior. Patient reports no chest pain, dizziness, nausea, palpitations, shortness of breath or suicidal ideas.       History of Present Illness         C/o worsening anxiety  Studying for the MCAT and will be testing in April   Once weekly he has a fast heart rate at rest.   Hx of depression. Denies feeling depressed. Not on medication   Able to sleep fine  Works PT and volunteers also    Health Maintenance Due  Topic Date Due   HPV VACCINES (1 - Male 3-dose series) Never done   Pneumococcal Vaccine 55-72 Years old (2 of 2 - PPSV23 or PCV20) 05/12/2022   COVID-19 Vaccine (6 - 2024-25 season) 10/21/2023    Past Medical History:  Diagnosis Date   Allergy    Crohn's disease (HCC)    Depression    Eczema    Uveitis     Past Surgical History:  Procedure Laterality Date   TURBINATE REDUCTION Bilateral 03/10/2021   Procedure: BILATERAL TURBINATE REDUCTION;  Surgeon: Newman Pies, MD;  Location: [facility];  Service: ENT;  Laterality: Bilateral;    Family History  Problem Relation Age of Onset   Hypertension Mother    Diabetes Mother    Hypertension Father    Hypertension Maternal Grandmother    Prostate cancer Maternal Grandfather    Hypertension Maternal Grandfather    Hyperlipidemia Maternal Grandfather    Glaucoma Maternal Grandfather    Diabetes Maternal Uncle     Social History   Socioeconomic History   Marital status: Single    Spouse name: Not on file   Number of children: 0   Years of education: Not on file   Highest education level: Bachelor's degree (e.g., BA, AB, BS)  Occupational History   Occupation: student  Tobacco Use   Smoking status: Never   Smokeless tobacco: Never  Vaping Use   Vaping status: Never Used  Substance and Sexual  Activity   Alcohol use: Never   Drug use: Never   Sexual activity: Not Currently    Partners: Female  Other Topics Concern   Not on file  Social History Narrative   No contact with father. Lives with mom, sister, Maternal grandparents, aunt, uncle, 1 dog Graduated from [employer]. Going to go to [employer] for college.   Social Drivers of Corporate investment banker Strain: Low Risk  (07/16/2023)   Overall Financial Resource Strain (CARDIA)    Difficulty of Paying Living Expenses: Not hard at all  Food Insecurity: No Food Insecurity (07/16/2023)   Hunger Vital Sign    Worried About Running Out of Food in the Last Year: Never true    Ran Out of Food in the Last Year: Never true  Transportation Needs: No Transportation Needs (07/16/2023)   PRAPARE - Administrator, Civil Service (Medical): No    Lack of Transportation (Non-Medical): No  Physical Activity: Sufficiently Active (07/16/2023)   Exercise Vital Sign    Days of Exercise per Week: 4 days    Minutes of Exercise per Session: 60 min  Stress: Stress Concern Present (07/16/2023)   [employer] of Occupational Health - Occupational Stress Questionnaire    Feeling of Stress :  Rather much  Social Connections: Socially Isolated (07/16/2023)   Social Connection and Isolation Panel [NHANES]    Frequency of Communication with Friends and Family: Once a week    Frequency of Social Gatherings with Friends and Family: Once a week    Attends Religious Services: Never    Database administrator or Organizations: No    Attends Engineer, structural: Not on file    Marital Status: Never married  Intimate Partner Violence: Not on file    Outpatient Medications Prior to Visit  Medication Sig Dispense Refill   adalimumab (HUMIRA) 40 MG/0.8ML AJKT pen Inject 1 Pen into the skin once a week. 12 each 3   Cholecalciferol 125 MCG (5000 UT) capsule Take by mouth.     EPINEPHrine (EPIPEN 2-PAK) 0.3 mg/0.3 mL IJ SOAJ injection Inject  one epipen into the thigh muscle as needed for systemic reaction. May repeat dose once if needed 2 each 1   EPINEPHrine (EPIPEN 2-PAK) 0.3 mg/0.3 mL IJ SOAJ injection Use as directed as needed for systemic reactions 2 each 1   EPINEPHrine 0.3 mg/0.3 mL IJ SOAJ injection Inject into the muscle as directed.     finasteride (PROPECIA) 1 MG tablet Take 1 tablet (1 mg total) by mouth daily. 90 tablet 3   Fluocinolone Acetonide Body 0.01 % OIL Apply 1 small Application topically to affected area of scalp at bedtime for 2 weeks. 118.28 mL 3   fluticasone (FLONASE) 50 MCG/ACT nasal spray Use 2 sprays into both nostrils daily 16 g 5   loratadine (CLARITIN REDITABS) 10 MG dissolvable tablet Take by mouth.     metroNIDAZOLE (METROGEL) 0.75 % gel Apply 1 small Application topically to face 2 (two) times daily. 45 g 2   tacrolimus (PROTOPIC) 0.1 % ointment apply topically to areas of frequent recurrence once daily. 60 g 5   tretinoin (RETIN-A) 0.05 % cream 1 application on the skin NIGHTLY Use a few nights a week and increase to nightly as tolerated. (Patient not taking: Reported on 12/08/2023) 20 g 2   triamcinolone cream (KENALOG) 0.1 % Apply 1 small Application topically to affected area 2 (two) times daily for eczema. (Patient not taking: Reported on 12/08/2023) 454 g 2   HYDROcodone-acetaminophen (NORCO/VICODIN) 5-325 MG tablet Take 1 tablet by mouth every 6 (six) hours as needed for pain for up to 5 days (Patient not taking: Reported on 12/08/2023) 5 tablet 0   tacrolimus (PROTOPIC) 0.1 % ointment Apply 1 Dose topically as needed.     tretinoin (RETIN-A) 0.05 % cream Apply on the skin nightly; Use a few nights a week and increase to nightly as tolerated. (Patient not taking: Reported on 12/08/2023) 20 g 2   triamcinolone cream (KENALOG) 0.1 % Apply 1 gram to skin twice a day (Patient not taking: Reported on 12/08/2023) 908 g 2   No facility-administered medications prior to visit.    No Known  Allergies  Review of Systems  Constitutional:  Negative for chills, fever and malaise/fatigue.  Respiratory:  Negative for shortness of breath.   Cardiovascular:  Negative for chest pain, palpitations and leg swelling.  Gastrointestinal:  Negative for abdominal pain, constipation, diarrhea, nausea and vomiting.  Genitourinary:  Negative for dysuria, frequency and urgency.  Neurological:  Negative for dizziness and focal weakness.  Psychiatric/Behavioral:  Negative for depression, substance abuse and suicidal ideas. The patient is nervous/anxious.        Objective:    Physical Exam Constitutional:  General: He is not in acute distress.    Appearance: He is not ill-appearing.  Eyes:     Extraocular Movements: Extraocular movements intact.     Conjunctiva/sclera: Conjunctivae normal.  Cardiovascular:     Rate and Rhythm: Normal rate.  Pulmonary:     Effort: Pulmonary effort is normal.  Musculoskeletal:     Cervical back: Normal range of motion and neck supple.  Skin:    General: Skin is warm and dry.  Neurological:     General: No focal deficit present.     Mental Status: He is alert and oriented to person, place, and time.  Psychiatric:        Mood and Affect: Mood normal.        Behavior: Behavior normal.        Thought Content: Thought content normal.      BP 118/82 (BP Location: Left Arm, Patient Position: Sitting)   Pulse 93   Temp 98 F (36.7 C) (Temporal)   Ht 5\' 8"  (1.727 m)   Wt 201 lb (91.2 kg)   SpO2 98%   BMI 30.56 kg/m  Wt Readings from Last 3 Encounters:  12/08/23 201 lb (91.2 kg)  07/18/23 199 lb (90.3 kg)  03/22/23 193 lb (87.5 kg)       Assessment & Plan:   Problem List Items Addressed This Visit     Tachycardia   Other Visit Diagnoses       Situational anxiety    -  Primary   Relevant Medications   escitalopram (LEXAPRO) 5 MG tablet      Start Lexapro 5 mg at bedtime. After one week, start metoprolol XL 25 mg daily.  Discussed  distraction techniques.  Recommend PCP follow up in 4 wks   I have discontinued Jethro Bastos HYDROcodone-acetaminophen. I am also having him start on escitalopram and metoprolol succinate. Additionally, I am having him maintain his loratadine, EPINEPHrine, Cholecalciferol, fluticasone, adalimumab, finasteride, EPINEPHrine, tretinoin, Fluocinolone Acetonide Body, metroNIDAZOLE, triamcinolone cream, tacrolimus, and EPINEPHrine.  Meds ordered this encounter  Medications   escitalopram (LEXAPRO) 5 MG tablet    Sig: Take 1 tablet (5 mg total) by mouth at bedtime.    Dispense:  30 tablet    Refill:  1    Supervising Provider:   Hillard Danker A [[id number]]   metoprolol succinate (TOPROL-XL) 25 MG 24 hr tablet    Sig: Take 1 tablet (25 mg total) by mouth daily.    Dispense:  30 tablet    Refill:  1    Supervising Provider:   Hillard Danker A [[id number]]

## 2023-12-08 NOTE — Telephone Encounter (Signed)
Patient called and stated that he has an upcoming appointment with Quentin Mulling and was wondering if he needs to have blood work done before his follow up appointment. Patient is requesting a call back. Please advise.

## 2023-12-08 NOTE — Patient Instructions (Signed)
Start daily Lexapro and metoprolol as discussed. If you prefer, you can start Lexapro and then 1 week later start metoprolol.   Follow up with Dr. Yetta Barre in approximately 4 weeks.

## 2023-12-12 ENCOUNTER — Encounter: Payer: Self-pay | Admitting: Internal Medicine

## 2023-12-12 ENCOUNTER — Ambulatory Visit: Payer: Commercial Managed Care - PPO | Admitting: Internal Medicine

## 2023-12-12 VITALS — BP 140/80 | HR 102 | Temp 99.5°F | Ht 68.0 in | Wt 202.0 lb

## 2023-12-12 DIAGNOSIS — K50919 Crohn's disease, unspecified, with unspecified complications: Secondary | ICD-10-CM

## 2023-12-12 DIAGNOSIS — R509 Fever, unspecified: Secondary | ICD-10-CM

## 2023-12-12 LAB — CBC WITH DIFFERENTIAL/PLATELET
Basophils Absolute: 0 10*3/uL (ref 0.0–0.1)
Basophils Relative: 0.5 % (ref 0.0–3.0)
Eosinophils Absolute: 0.1 10*3/uL (ref 0.0–0.7)
Eosinophils Relative: 0.7 % (ref 0.0–5.0)
HCT: 42.7 % (ref 39.0–52.0)
Hemoglobin: 14.2 g/dL (ref 13.0–17.0)
Lymphocytes Relative: 15 % (ref 12.0–46.0)
Lymphs Abs: 1.3 10*3/uL (ref 0.7–4.0)
MCHC: 33.3 g/dL (ref 30.0–36.0)
MCV: 92.4 fl (ref 78.0–100.0)
Monocytes Absolute: 1 10*3/uL (ref 0.1–1.0)
Monocytes Relative: 11.9 % (ref 3.0–12.0)
Neutro Abs: 6.3 10*3/uL (ref 1.4–7.7)
Neutrophils Relative %: 71.9 % (ref 43.0–77.0)
Platelets: 263 10*3/uL (ref 150.0–400.0)
RBC: 4.62 Mil/uL (ref 4.22–5.81)
RDW: 12.5 % (ref 11.5–15.5)
WBC: 8.8 10*3/uL (ref 4.0–10.5)

## 2023-12-12 NOTE — Assessment & Plan Note (Signed)
Flu and covid testing negative twice over the weekend. Do not feel utility in repeat testing. Parents sent with advice to him of perhaps respiratory panel although we did discuss that the outcomes of this would not change treatment. Also mono and CBC which are reasonable and ordered. No cervical LAD. No tenderness with movement of neck or other signs of meningitis. Advised to start flonase to help congestion potentially and the behind eye pressure.

## 2023-12-12 NOTE — Assessment & Plan Note (Signed)
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 Needs CBC with diff and mono testing.

## 2023-12-12 NOTE — Progress Notes (Signed)
   Subjective:   Patient ID: Billy Allen, male    DOB: 01-09-2001, 23 y.o.   MRN: [id number]  HPI The patient is a 23 YO man coming in for fevers and headache over the weekend. Some muscle aching. Did home covid-19/flu a&b twice over the weekend which were negative. Symptoms started Friday. No cough or SOB or throat pain. No significant nose drainage. Is immune compromised on humira.   Review of Systems  Constitutional:  Positive for fever.  HENT: Negative.    Eyes: Negative.   Respiratory:  Negative for cough, chest tightness and shortness of breath.   Cardiovascular:  Negative for chest pain, palpitations and leg swelling.  Gastrointestinal:  Negative for abdominal distention, abdominal pain, constipation, diarrhea, nausea and vomiting.  Musculoskeletal: Negative.   Skin: Negative.   Neurological:  Positive for headaches.  Psychiatric/Behavioral: Negative.      Objective:  Physical Exam Constitutional:      Appearance: He is well-developed.  HENT:     Head: Normocephalic and atraumatic.  Cardiovascular:     Rate and Rhythm: Normal rate and regular rhythm.  Pulmonary:     Effort: Pulmonary effort is normal. No respiratory distress.     Breath sounds: Normal breath sounds. No wheezing or rales.  Abdominal:     General: Bowel sounds are normal. There is no distension.     Palpations: Abdomen is soft.     Tenderness: There is no abdominal tenderness. There is no rebound.  Musculoskeletal:     Cervical back: Normal range of motion.  Skin:    General: Skin is warm and dry.  Neurological:     Mental Status: He is alert and oriented to person, place, and time.     Coordination: Coordination normal.     Vitals:   12/12/23 1359 12/12/23 1405  BP: (!) 140/80 (!) 140/80  Pulse: (!) 102   Temp: 99.5 F (37.5 C)   TempSrc: Oral   SpO2: 99%   Weight: 202 lb (91.6 kg)   Height: 5\' 8"  (1.727 m)     Assessment & Plan:

## 2023-12-13 LAB — EPSTEIN-BARR VIRUS VCA ANTIBODY PANEL
EBV NA IgG: 251 U/mL — ABNORMAL HIGH
EBV VCA IgG: >750 U/mL — ABNORMAL HIGH
EBV VCA IgM: <36 U/mL

## 2023-12-14 ENCOUNTER — Encounter: Payer: Self-pay | Admitting: Gastroenterology

## 2023-12-14 ENCOUNTER — Ambulatory Visit: Payer: Commercial Managed Care - PPO | Admitting: Gastroenterology

## 2023-12-14 ENCOUNTER — Other Ambulatory Visit: Payer: Commercial Managed Care - PPO

## 2023-12-14 VITALS — BP 124/82 | HR 91 | Ht 68.0 in | Wt 201.0 lb

## 2023-12-14 DIAGNOSIS — Z79899 Other long term (current) drug therapy: Secondary | ICD-10-CM

## 2023-12-14 DIAGNOSIS — K5010 Crohn's disease of large intestine without complications: Secondary | ICD-10-CM

## 2023-12-14 NOTE — Patient Instructions (Signed)
Your provider has requested that you go to the basement level for lab work before leaving today. Press "B" on the elevator. The lab is located at the first door on the left as you exit the elevator.  _______________________________________________________  If your blood pressure at your visit was 140/90 or greater, please contact your primary care physician to follow up on this.  _______________________________________________________  If you are age 22 or older, your body mass index should be between 23-30. Your Body mass index is 30.56 kg/m. If this is out of the aforementioned range listed, please consider follow up with your Primary Care Provider.  If you are age 78 or younger, your body mass index should be between 19-25. Your Body mass index is 30.56 kg/m. If this is out of the aformentioned range listed, please consider follow up with your Primary Care Provider.   ________________________________________________________  The [facility] providers would like to encourage you to use [facility] to communicate with providers for non-urgent requests or questions.  Due to long hold times on the telephone, sending your provider a message by [facility] may be a faster and more efficient way to get a response.  Please allow 48 business hours for a response.  Please remember that this is for non-urgent requests.  _______________________________________________________

## 2023-12-14 NOTE — Progress Notes (Signed)
12/14/2023 Billy Allen [id number] [birth year]   HISTORY OF PRESENT ILLNESS: This is a 23 year old male who is a patient Dr. Lanetta Inch.  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  Crohn's disease history: Dx of Crohn's colitis when he presented in April 2021 with severe diarrhea.  Previously followed by Dr. Jacqlyn Krauss at [facility]  Initially treated with methotrexate and folic acid, colonoscopy June 2022 showed active disease.  He was started on Humira at that time.   Colonoscopy June 2023 showed some mildly active colitis and his Humira levels with subtherapeutic with no antibodies so they increased his Humira to once weekly dosing.  Last visit here was in May 2024 and fecal calprotectin at that time was normal.  He was feeling well.  He has since developed a perianal abscess that was treated by surgery back in October.  Fecal calprotectin in November was elevated 159.  Since resolution of that perianal abscess he has had no issues.  He denies any gut complaints.  Says that he is moving his bowels regularly about once a day.  No rectal bleeding.  No abdominal pain.  He says pretty much eats what he wants.  He did have his flu shot this year.  He had a low-grade fever between 100.4 and 101 degrees for a few days last week, the last that occurred was a couple of days ago.  No other upper respiratory symptoms.  CBC at an urgent care was normal.  He is due for his Humira injection today.  No other joint/skin/eye complaints.  IBD Health Care Maintenance: Annual Flu Vaccine - Date: 2024 UTD Pneumococcal Vaccine if receiving immunosuppression: - Date: PSV13 2023 TB testing if on anti-TNF, yearly - Date 01/2022 - DUE Vitamin D screening - Date; 02/2022 Last Colonoscopy - Date: 03/2022   Endoscopic history:  EGD 03/2021: - Normal esophagus.  - Normal stomach. Biopsied.  - Normal examined duodenum. Biopsied.   - Normal esophagus. Biopsied.    Colonoscopy 03/2021: The  perianal and digital rectal examinations were normal.  The terminal ileum appeared normal. Estimated blood loss was minimal.  Biopsies were taken with a cold forceps for histology.  Inflammation characterized by multiple shallow ulcerations was found as patches in the transverse colon and in the ascending colon. The anus, the rectum, the recto-sigmoid colon, the sigmoid colon, the descending colon, the splenic flexure and the distal transverse colon were spared. The inflammation was moderate in severity. Biopsies were taken with a cold forceps for histology. Estimated blood loss was minimal.    Diagnosis     A: Stomach, biopsy - Gastric mucosa with no diagnostic alterations including no Helicobacter pylori organisms seen   B: Small bowel, duodenum, biopsy - Duodenal mucosa with no diagnostic alterations including no evidence of celiac disease   C: Esophagus, biopsy - Fragments of esophageal squamous epithelium with no diagnostic alterations including no increased eosinophil infiltrate   D: Colon, right, biopsy - Chronic active colitis with mild to moderate activity, consistent with the patient's clinical history of Crohn's disease - No evidence of dysplasia   E: Colon, transverse, biopsy - Chronic active colitis with moderate activity and granulomatous formation, consistent with the patient's clinical history of Crohn's disease - No evidence of dysplasia   F: Colon, left, biopsy - Chronic colitis with focal mild activity and granulomatous formation, consistent with the patient's clinical history of Crohn's disease - No evidence of dysplasia   G: Small bowel, terminal ileum, biopsy -  Small bowel mucosa with prominent lymphoid aggregates, consistent with ileal mucosa with no diagnostic alterations     EGD 01/2020:    - Localized nodularity of the distal esophagus,        otherwise normal esophagus. Biopsied.      - Nodular mucosa in the gastric antrum. Biopsied.      - Normal examined  duodenum. Biopsied.    Colonoscopy 01/2020: - Congested, erythematous areas of colonic mucosa with apthae. Biopsied.  - The examined portion of the ileum was normal.  Biopsied.    A: Stomach, biopsy - Chronic superficial active gastritis with rare intramucosal granulomas compatible with involvement by patient's known history of inflammatory bowel disease - Reactive foveolar hyperplasia - An immunohistochemical stain for H. Pylori has been ordered and the results will be issued in an addendum - No evidence of malignancy, dysplasia, or metaplasia   B: Esophagus, biopsy - Superficial strips of benign squamous mucosa - No evidence of dysplasia or metaplasia   C: Small bowel, duodenum, biopsy - Acute and chronic duodenitis compatible with involvement by patient's known history of inflammatory bowel disease - No evidence of malignancy or dysplasia   D: Colon, right, biopsy - Focally active chronic colitis consistent with involvement by patient's known history of inflammatory bowel disease - No evidence of malignancy, dysplasia, granulomas, or viral cytopathic effect   E: Colon, transverse, biopsy - Focally active chronic colitis consistent with involvement by patient's known history of inflammatory bowel disease - No evidence of malignancy, dysplasia, granulomas, or viral cytopathic effect   F: Colon, left, biopsy - Features of chronic mucosal injury - No evidence of active colitis in this specimen - No evidence of malignancy, dysplasia, granulomas, or viral cytopathic effect   G: Small bowel, terminal ileum, biopsy - Focally active chronic ileitis compatible with involvement by patient's known history of inflammatory bowel disease - No evidence of malignancy, dysplasia, granulomas, or viral cytopathic effect     Colonoscopy 04/09/22: The perianal and digital rectal examinations were normal. - The terminal ileum appeared normal. - Patchy very faint inflammatory characterized by  granularity and loss of vascularity was found in the sigmoid colon, in the transverse colon and in the ascending colon. No ulcerations or erosions noted. - Internal hemorrhoids were found during retroflexion. The hemorrhoids were small. - The exam was otherwise without abnormality. - Biopsies were taken with a cold forceps for histology from right, transverse, and left colon.     1. Surgical [P], right colon biopsies MILD TO MODERATE ACTIVE COLITIS WITH ARCHITECTURAL DISARRAY. LYMPHOPLASMACYTIC INFILTRATE AND LYMPHOID AGGREGATES IN THE LAMINA PROPRIA CONSISTENT WITH CLINICAL HISTORY OF CROHN'S DISEASE. NO DYSPLASIA, GRANULOMATOUS INFLAMMATION OR MALIGNANCY IS SEEN. 2. Surgical [P], transverse colon biopsies MILD ACTIVE COLITIS WITH ARCHITECTURAL DISARRAY. LYMPHOPLASMACYTIC INFILTRATE WITH LYMPHOID AGGREGATES ARE CONSISTENT WITH CLINICAL HISTORY OF CROHN'S DISEASE. NO DYSPLASIA, GRANULOMATOUS INFLAMMATION OR MALIGNANCY IS SEEN. 3. Surgical [P], left colon and rectal biopsies MILD ACTIVE COLITIS WITH ARCHITECTURAL DISARRAY. PROMINENT LYMPHOPLASMACYTIC INFILTRATE AND LYMPHOID AGGREGATES ARE SEEN CONSISTENT WITH CLINICAL HISTORY OF CROHN'S DISEASE. NO DYSPLASIA, GRANULOMATOUS INFLAMMATION OR MALIGNANCY IS SEEN. COMMENT: IMMUNOHISTOCHEMISTRY WITH APPROPRIATE CONTROLS FOR CD3 AND CD20 WAS PERFORMED ON ALL 3 BIOPSIES AND CONFIRMS THE POLYMORPHOUS NATURE OF THE LYMPHOID AGGREGATES. IMMUNOHISTOCHEMISTRY FOR CMV IS BEING ORDERED AND WILL BE REPORTED IN AN ADDENDUM. CLINICAL AND RADIOLOGICAL CORRELATION IS REQUIRED. THE CASE WAS DISCUSSED IN INTRADEPARTMENTAL CONSULTATION WITH DR. MARK LEGOLVAN AND DR. SMIR WHO CONCUR WITH THE FINDINGS.   Fecal calpro 07/2022 - 39  and November 2024 was 159  Past Medical History:  Diagnosis Date   Allergy    Crohn's disease (HCC)    Depression    Eczema    Uveitis    Past Surgical History:  Procedure Laterality Date   TURBINATE REDUCTION Bilateral  03/10/2021   Procedure: BILATERAL TURBINATE REDUCTION;  Surgeon: Newman Pies, MD;  Location:  SURGERY CENTER;  Service: ENT;  Laterality: Bilateral;    reports that he has never smoked. He has never used smokeless tobacco. He reports that he does not drink alcohol and does not use drugs. family history includes Diabetes in his maternal uncle and mother; Glaucoma in his maternal grandfather; Hyperlipidemia in his maternal grandfather; Hypertension in his father, maternal grandfather, maternal grandmother, and mother; Prostate cancer in his maternal grandfather. No Known Allergies    Outpatient Encounter Medications as of 12/14/2023  Medication Sig   adalimumab (HUMIRA) 40 MG/0.8ML AJKT pen Inject 1 Pen into the skin once a week.   EPINEPHrine (EPIPEN 2-PAK) 0.3 mg/0.3 mL IJ SOAJ injection Inject one epipen into the thigh muscle as needed for systemic reaction. May repeat dose once if needed   EPINEPHrine (EPIPEN 2-PAK) 0.3 mg/0.3 mL IJ SOAJ injection Use as directed as needed for systemic reactions   EPINEPHrine 0.3 mg/0.3 mL IJ SOAJ injection Inject into the muscle as directed.   escitalopram (LEXAPRO) 5 MG tablet Take 1 tablet (5 mg total) by mouth at bedtime.   Cholecalciferol 125 MCG (5000 UT) capsule Take by mouth. (Patient not taking: Reported on 12/14/2023)   finasteride (PROPECIA) 1 MG tablet Take 1 tablet (1 mg total) by mouth daily. (Patient not taking: Reported on 12/14/2023)   Fluocinolone Acetonide Body 0.01 % OIL Apply 1 small Application topically to affected area of scalp at bedtime for 2 weeks. (Patient not taking: Reported on 12/14/2023)   fluticasone (FLONASE) 50 MCG/ACT nasal spray Use 2 sprays into both nostrils daily   loratadine (CLARITIN REDITABS) 10 MG dissolvable tablet Take by mouth. (Patient not taking: Reported on 12/14/2023)   metoprolol succinate (TOPROL-XL) 25 MG 24 hr tablet Take 1 tablet (25 mg total) by mouth daily. (Patient not taking: Reported on 12/14/2023)    metroNIDAZOLE (METROGEL) 0.75 % gel Apply 1 small Application topically to face 2 (two) times daily. (Patient not taking: Reported on 12/14/2023)   tacrolimus (PROTOPIC) 0.1 % ointment apply topically to areas of frequent recurrence once daily. (Patient not taking: Reported on 12/14/2023)   tretinoin (RETIN-A) 0.05 % cream 1 application on the skin NIGHTLY Use a few nights a week and increase to nightly as tolerated. (Patient not taking: Reported on 12/08/2023)   triamcinolone cream (KENALOG) 0.1 % Apply 1 small Application topically to affected area 2 (two) times daily for eczema. (Patient not taking: Reported on 12/08/2023)   [DISCONTINUED] amitriptyline (ELAVIL) 25 MG tablet Take 1 tablet (25 mg total) by mouth at bedtime as needed for sleep.   No facility-administered encounter medications on file as of 12/14/2023.    REVIEW OF SYSTEMS  : All other systems reviewed and negative except where noted in the History of Present Illness.   PHYSICAL EXAM: BP 124/82   Pulse 91   Ht 5\' 8"  (1.727 m)   Wt 201 lb (91.2 kg)   BMI 30.56 kg/m  General: Well developed white male in no acute distress Head: Normocephalic and atraumatic Eyes:  Sclerae anicteric, conjunctiva pink. Ears: Normal auditory acuity Lungs: Clear throughout to auscultation; no W/R/R. Heart: Regular rate and  rhythm; no M/R/G. Abdomen: Soft, non-distended.  BS present.  Non-tender. Musculoskeletal: Symmetrical with no gross deformities  Skin: No lesions on visible extremities Extremities: No edema  Neurological: Alert oriented x 4, grossly non-focal Psychological:  Alert and cooperative. Normal mood and affect  ASSESSMENT AND PLAN: 23 y.o. male here for assessment of the following   1. Crohn's disease of large intestine without complication (HCC)   2. High risk medication use     As above, had subtherapeutic Humira levels with active colitis in 2023.  Humira was adjusted to once weekly dosing.  He has been on methotrexate  in the past, they had discussed thiopurines but held off.  He clinically is doing really well.  He has no active Crohn's symptoms, no uveitis, but did have a perianal abscess in October.Marland Kitchen  He is tolerating Humira well.  Fecal calprotectin was normal about a year ago but up to 159 again in November.    For now we will just plan to check Humira drug levels and antibodies.  He is actually due for his injection today, but has not yet taken it so we will check this today.  Depending on those results we will get other recommendations from Dr. Adela Lank.    CC:  Etta Grandchild, MD

## 2023-12-14 NOTE — Progress Notes (Signed)
Agree with checking his Humira levels to see where they are trending in for antibodies. If he developed a perirectal abscess on Humira and has a mildly elevated fecal calprotectin, that would argue it is not working as well as it should. Lets get his Humira levels checked but Shanda Bumps I think may be best for him to see me in the office in the next few months to discuss alternative therapies.  You can forward his levels to me when they come in.  Thanks

## 2023-12-19 LAB — ADALIMUMAB DRUG LEVEL AND ANTIDRUG AB FOR IBD
ADALIMUMAB ADA,IBD: <10 [AU] (ref <10)
ADALIMUMAB LEVEL,IBD: 12.3 ug/mL

## 2023-12-20 ENCOUNTER — Other Ambulatory Visit: Payer: Self-pay

## 2023-12-20 DIAGNOSIS — K5010 Crohn's disease of large intestine without complications: Secondary | ICD-10-CM

## 2023-12-23 ENCOUNTER — Other Ambulatory Visit: Payer: Self-pay

## 2023-12-23 DIAGNOSIS — J301 Allergic rhinitis due to pollen: Secondary | ICD-10-CM | POA: Diagnosis not present

## 2023-12-23 DIAGNOSIS — J3089 Other allergic rhinitis: Secondary | ICD-10-CM | POA: Diagnosis not present

## 2023-12-23 DIAGNOSIS — J3081 Allergic rhinitis due to animal (cat) (dog) hair and dander: Secondary | ICD-10-CM | POA: Diagnosis not present

## 2023-12-23 NOTE — Progress Notes (Signed)
 Specialty Pharmacy Refill Coordination Note  Billy Allen is a 23 y.o. male contacted today regarding refills of specialty medication(s) Adalimumab (HUMIRA)   Patient requested (Patient-Rptd) Delivery   Delivery date: (Patient-Rptd) 12/27/23   Verified address: (Patient-Rptd) [address] [place], [place] [address]   Medication will be filled on 03.03.25.

## 2023-12-26 ENCOUNTER — Other Ambulatory Visit (HOSPITAL_COMMUNITY): Payer: Self-pay

## 2023-12-26 ENCOUNTER — Ambulatory Visit (INDEPENDENT_AMBULATORY_CARE_PROVIDER_SITE_OTHER)

## 2023-12-26 ENCOUNTER — Ambulatory Visit: Admitting: Family Medicine

## 2023-12-26 ENCOUNTER — Ambulatory Visit: Payer: Self-pay | Admitting: Internal Medicine

## 2023-12-26 ENCOUNTER — Other Ambulatory Visit

## 2023-12-26 ENCOUNTER — Encounter: Payer: Self-pay | Admitting: Family Medicine

## 2023-12-26 ENCOUNTER — Other Ambulatory Visit: Payer: Self-pay

## 2023-12-26 VITALS — BP 122/80 | HR 78 | Temp 98.4°F | Resp 18 | Ht 68.0 in | Wt 205.0 lb

## 2023-12-26 DIAGNOSIS — R590 Localized enlarged lymph nodes: Secondary | ICD-10-CM

## 2023-12-26 DIAGNOSIS — L219 Seborrheic dermatitis, unspecified: Secondary | ICD-10-CM

## 2023-12-26 LAB — CBC WITH DIFFERENTIAL/PLATELET
Basophils Absolute: 0.1 10*3/uL (ref 0.0–0.1)
Basophils Relative: 1.3 % (ref 0.0–3.0)
Eosinophils Absolute: 0.1 10*3/uL (ref 0.0–0.7)
Eosinophils Relative: 1.7 % (ref 0.0–5.0)
HCT: 44.8 % (ref 39.0–52.0)
Hemoglobin: 14.8 g/dL (ref 13.0–17.0)
Lymphocytes Relative: 36.3 % (ref 12.0–46.0)
Lymphs Abs: 3.2 10*3/uL (ref 0.7–4.0)
MCHC: 33.1 g/dL (ref 30.0–36.0)
MCV: 92.5 fl (ref 78.0–100.0)
Monocytes Absolute: 1.1 10*3/uL — ABNORMAL HIGH (ref 0.1–1.0)
Monocytes Relative: 12.1 % — ABNORMAL HIGH (ref 3.0–12.0)
Neutro Abs: 4.3 10*3/uL (ref 1.4–7.7)
Neutrophils Relative %: 48.6 % (ref 43.0–77.0)
Platelets: 370 10*3/uL (ref 150.0–400.0)
RBC: 4.84 Mil/uL (ref 4.22–5.81)
RDW: 12.6 % (ref 11.5–15.5)
WBC: 8.7 10*3/uL (ref 4.0–10.5)

## 2023-12-26 LAB — SEDIMENTATION RATE: Sed Rate: 34 mm/h — ABNORMAL HIGH (ref 0–15)

## 2023-12-26 MED ORDER — TRIAMCINOLONE ACETONIDE 0.1 % EX CREA
1.0000 | TOPICAL_CREAM | Freq: Two times a day (BID) | CUTANEOUS | 1 refills | Status: AC
  Filled 2023-12-26: qty 80, 30d supply, fill #0

## 2023-12-26 MED ORDER — MUPIROCIN 2 % EX OINT
TOPICAL_OINTMENT | CUTANEOUS | 0 refills | Status: DC
  Filled 2023-12-26: qty 22, 7d supply, fill #0

## 2023-12-26 NOTE — Progress Notes (Unsigned)
 Assessment & Plan:  1. Cervical lymphadenopathy (Primary) Education provided on lymphadenopathy.  - CBC with Differential/Platelet - Sedimentation rate - DG Chest 2 View; Future - US Soft Tissue Head/Neck (NON-THYROID); Future - C-reactive protein - Comprehensive metabolic panel - Lactate dehydrogenase  2. Seborrheic dermatitis Patient reports previous resolution of these symptoms with triamcinolone cream.  - triamcinolone cream (KENALOG) 0.1 %; Apply 1 small Application topically to affected area 2 (two) times daily for eczema.  Dispense: 80 g; Refill: 1 - mupirocin ointment (BACTROBAN) 2 %; Apply to affected area three times daily for 7 days.  Dispense: 30 g; Refill: 0   Follow up plan: Return if symptoms worsen or fail to improve.  Deliah Boston, MSN, APRN, FNP-C  Subjective:  HPI: Billy Allen is a 23 y.o. male presenting on 12/26/2023 for lymph node swelling (Right side neck - noticed about 1 week ago (no ear pain, ST, drainage, no fever)//Recent (2/17 illness - seen [facility]) for fever, HA and muscle aches) no RESP s/s/)  Patient is accompanied by his step-dad, who he is okay with being present.  Patient reports enlarged and tender lymph nodes of the right side of the his neck that started 4 to 5 days ago.  Denies fever, sore throat, ear pain, dental pain, runny nose, flu-like symptoms, difficulty swallowing, and difficulty breathing. He feels the lymph nodes are enlarging.    ROS: Negative unless specifically indicated above in HPI.   Relevant past medical history reviewed and updated as indicated.   Allergies and medications reviewed and updated.   Current Outpatient Medications:    adalimumab (HUMIRA) 40 MG/0.8ML AJKT pen, Inject 1 Pen into the skin once a week., Disp: 12 each, Rfl: 3   escitalopram (LEXAPRO) 5 MG tablet, Take 1 tablet (5 mg total) by mouth at bedtime., Disp: 30 tablet, Rfl: 1   finasteride (PROPECIA) 1 MG tablet, Take 1 tablet (1 mg total) by mouth  daily., Disp: 90 tablet, Rfl: 3   loratadine (CLARITIN REDITABS) 10 MG dissolvable tablet, Take by mouth., Disp: , Rfl:    EPINEPHrine (EPIPEN 2-PAK) 0.3 mg/0.3 mL IJ SOAJ injection, Inject one epipen into the thigh muscle as needed for systemic reaction. May repeat dose once if needed (Patient not taking: Reported on 12/26/2023), Disp: 2 each, Rfl: 1   Fluocinolone Acetonide Body 0.01 % OIL, Apply 1 small Application topically to affected area of scalp at bedtime for 2 weeks. (Patient not taking: Reported on 12/26/2023), Disp: 118.28 mL, Rfl: 3   fluticasone (FLONASE) 50 MCG/ACT nasal spray, Use 2 sprays into both nostrils daily (Patient not taking: Reported on 12/26/2023), Disp: 16 g, Rfl: 5   metoprolol succinate (TOPROL-XL) 25 MG 24 hr tablet, Take 1 tablet (25 mg total) by mouth daily. (Patient not taking: Reported on 12/26/2023), Disp: 30 tablet, Rfl: 1   metroNIDAZOLE (METROGEL) 0.75 % gel, Apply 1 small Application topically to face 2 (two) times daily. (Patient not taking: Reported on 12/26/2023), Disp: 45 g, Rfl: 2   tacrolimus (PROTOPIC) 0.1 % ointment, apply topically to areas of frequent recurrence once daily. (Patient not taking: Reported on 12/26/2023), Disp: 60 g, Rfl: 5   tretinoin (RETIN-A) 0.05 % cream, 1 application on the skin NIGHTLY Use a few nights a week and increase to nightly as tolerated. (Patient not taking: Reported on 12/26/2023), Disp: 20 g, Rfl: 2   triamcinolone cream (KENALOG) 0.1 %, Apply 1 small Application topically to affected area 2 (two) times daily for eczema. (Patient not  taking: Reported on 12/26/2023), Disp: 454 g, Rfl: 2  No Known Allergies  Objective:   BP 122/80   Pulse 78   Temp 98.4 F (36.9 C)   Resp 18   Ht 5\' 8"  (1.727 m)   Wt 205 lb (93 kg)   BMI 31.17 kg/m    Physical Exam Vitals reviewed.  Constitutional:      General: He is not in acute distress.    Appearance: Normal appearance. He is not ill-appearing, toxic-appearing or diaphoretic.  HENT:      Head: Normocephalic and atraumatic.     Right Ear: Tympanic membrane, ear canal and external ear normal. There is no impacted cerumen.     Left Ear: Tympanic membrane, ear canal and external ear normal. There is no impacted cerumen.     Ears:     Comments: Mildly erythematous ear canals. Crack in skin post-auricularly bilaterally with mild erythema. The left is within the antihelical fossa. The right is the posterior earlobe.     Nose: Nose normal.     Right Sinus: No maxillary sinus tenderness or frontal sinus tenderness.     Left Sinus: No maxillary sinus tenderness or frontal sinus tenderness.     Mouth/Throat:     Mouth: Mucous membranes are moist.     Pharynx: Oropharynx is clear. No oropharyngeal exudate or posterior oropharyngeal erythema.     Tonsils: No tonsillar exudate or tonsillar abscesses.  Eyes:     General: No scleral icterus.       Right eye: No discharge.        Left eye: No discharge.     Conjunctiva/sclera: Conjunctivae normal.  Cardiovascular:     Rate and Rhythm: Normal rate.  Pulmonary:     Effort: Pulmonary effort is normal. No respiratory distress.  Musculoskeletal:        General: Normal range of motion.     Cervical back: Normal range of motion.  Lymphadenopathy:     Cervical: Cervical adenopathy present.     Right cervical: Superficial cervical adenopathy (2 side by side almost at the top, 1 at the bottom) present.     Left cervical: Superficial cervical adenopathy (1 at the bottom) present.  Skin:    General: Skin is warm and dry.  Neurological:     Mental Status: He is alert and oriented to person, place, and time. Mental status is at baseline.  Psychiatric:        Mood and Affect: Mood normal.        Behavior: Behavior normal.        Thought Content: Thought content normal.        Judgment: Judgment normal.

## 2023-12-26 NOTE — Telephone Encounter (Signed)
 1st attempt- Patient disconnected on tx to NT, attempted to call back. Received VMB. LVM to return call. Will continue to attempt.   Copied from CRM [id number]. Topic: Clinical - Red Word Triage >> Dec 26, 2023  8:11 AM Gurney Maxin H wrote: [facility] that prompted transfer to Nurse Triage: Enlarged lymph note on right side of neck that's getting bigger, no pain, no fever.

## 2023-12-26 NOTE — Telephone Encounter (Signed)
  Chief Complaint: swollen  lymph nodes Symptoms: 2 swollen lymph nodes R side of neck Frequency: 4-5 days Pertinent Negatives: Patient denies fever, flu-like symptoms, difficulty swallowing or breathing Disposition: [] ED /[] Urgent Care (no appt availability in office) / [x] Appointment(In office/virtual)/ []  [facility]/ [] Home Care/ [] Refused Recommended Disposition /[] [facility]/ []  Follow-up with PCP Additional Notes: Pt reports 4-5 days of R sided lymph node swelling. States there are two nodes swollen on the R side - one is 3-4 fingers wide and one is 2 fingers wide. No difficulty swallowing or breathing. No fever or flu like symptoms. No pain. Per protocol pt scheduled today at 4pm. RN advised pt please call us back with any worsening, pt verbalized understanding.    Reason for Disposition . [1] Single large node AND [2] size > 1 inch (2.5 cm) AND [3] no fever  Answer Assessment - Initial Assessment Questions 1. LOCATION: "Where is the swollen node located?" "Is the matching node on the other side of the body also swollen?"      Two on the R side of your neck, one is larger than the other. No pain 2. SIZE: "How big is the node?" (e.g., inches or centimeters; or compared to common objects such as pea, bean, marble, golf ball)      "Index finger to pinky finger - 4 fingers wide", "the one on the bottom is two fingers wide" 3. ONSET: "When did the swelling start?"      4-5 days ago 4. NECK NODES: "Is there a sore throat, runny nose or other symptoms of a cold?"      No other symptoms 5. GROIN OR ARMPIT NODES: "Is there a sore, scratch, cut or painful red area on that arm or leg?"      No 6. FEVER: "Do you have a fever?" If Yes, ask: "What is it, how was it measured, and when did it start?"      No 7. CAUSE: "What do you think is causing the swollen lymph nodes?"     Unsure 8. OTHER SYMPTOMS: "Do you have any other symptoms?"     None - no pain unless he presses  on it  Protocols used: Lymph Nodes - Swollen-A-AH

## 2023-12-27 ENCOUNTER — Other Ambulatory Visit (HOSPITAL_COMMUNITY): Payer: Self-pay

## 2023-12-27 ENCOUNTER — Encounter: Payer: Self-pay | Admitting: Family Medicine

## 2023-12-27 LAB — COMPREHENSIVE METABOLIC PANEL
ALT: 24 U/L (ref 0–53)
AST: 18 U/L (ref 0–37)
Albumin: 4.7 g/dL (ref 3.5–5.2)
Alkaline Phosphatase: 64 U/L (ref 39–117)
BUN: 14 mg/dL (ref 6–23)
CO2: 27 meq/L (ref 19–32)
Calcium: 9.7 mg/dL (ref 8.4–10.5)
Chloride: 101 meq/L (ref 96–112)
Creatinine, Ser: 0.83 mg/dL (ref 0.40–1.50)
GFR: 124.98 mL/min (ref >60.00)
Glucose, Bld: 92 mg/dL (ref 70–99)
Potassium: 3.8 meq/L (ref 3.5–5.1)
Sodium: 136 meq/L (ref 135–145)
Total Bilirubin: 0.3 mg/dL (ref 0.2–1.2)
Total Protein: 8.5 g/dL — ABNORMAL HIGH (ref 6.0–8.3)

## 2023-12-27 LAB — C-REACTIVE PROTEIN: CRP: <1 mg/dL (ref 0.5–20.0)

## 2023-12-27 LAB — LACTATE DEHYDROGENASE: LDH: 154 U/L (ref 100–220)

## 2023-12-29 ENCOUNTER — Other Ambulatory Visit: Payer: Self-pay

## 2023-12-29 ENCOUNTER — Encounter: Payer: Self-pay | Admitting: Pharmacist

## 2023-12-29 ENCOUNTER — Ambulatory Visit: Admitting: Family Medicine

## 2023-12-29 ENCOUNTER — Other Ambulatory Visit (HOSPITAL_COMMUNITY): Payer: Self-pay

## 2023-12-29 VITALS — BP 114/74 | HR 91 | Temp 99.1°F | Ht 68.0 in | Wt 203.6 lb

## 2023-12-29 DIAGNOSIS — R590 Localized enlarged lymph nodes: Secondary | ICD-10-CM | POA: Diagnosis not present

## 2023-12-29 DIAGNOSIS — R5081 Fever presenting with conditions classified elsewhere: Secondary | ICD-10-CM | POA: Diagnosis not present

## 2023-12-29 DIAGNOSIS — D849 Immunodeficiency, unspecified: Secondary | ICD-10-CM

## 2023-12-29 DIAGNOSIS — R700 Elevated erythrocyte sedimentation rate: Secondary | ICD-10-CM

## 2023-12-29 MED ORDER — CEPHALEXIN 500 MG PO CAPS
500.0000 mg | ORAL_CAPSULE | Freq: Four times a day (QID) | ORAL | 0 refills | Status: DC
  Filled 2023-12-29 (×2): qty 28, 7d supply, fill #0

## 2023-12-29 NOTE — Progress Notes (Signed)
 OFFICE VISIT  12/29/2023  CC:  Chief Complaint  Patient presents with   Swollen Lymph Node    Pt had fever since last night; had labs on 3/3 elevated ESR. Pt has a cat that recently had fleas. Decreased appetite    Patient is a 23 y.o. male who presents accompanied by his mother for neck node swelling.  HPI: Approximately 3 weeks ago Billy Allen developed headache and some low-grade fever--> max around 100. He felt tired.  These symptoms abated and then he noted a swollen lymph node on the right side of his neck.  He monitored it for a few days and it continued to get bigger.   Patient was seen by Shon Hale, FNP on 12/26/2023.  Encounter note and lab data reviewed today.  Sed rate elevated to 34.  CRP, CMet, LDH, and CBC all normal (mild inc monocytes).  Last night he developed a fever to 102. He does not note any new swollen lymph glands. He has a history of eczema, has a small fissure behind the left ear but there has been no erythema or swelling at that area. He does have a cat but does not recall any recent cat scratch anywhere.  The cat did recently have fleas. Billy Allen denies any sore throat, nasal congestion, sinus pressure, cough, oral lesions, or dental problems lately. No recent foreign travel. He does work in [employer] as a Lawyer.  The early part of this illness he did home flu and COVID testing and they were negative.  He has Crohn's disease, asymptomatic lately.  Has been on Humira a couple of years, no recent dose increase.  Review of systems: No abdominal pain, nausea, blood in stool, or bloating. No joint swelling.  He has not noted any swelling of lymph nodes in the axillary, antecubital, or groin regions.  Past Medical History:  Diagnosis Date   Allergy    Crohn's disease (HCC)    Depression    Eczema    Uveitis     Past Surgical History:  Procedure Laterality Date   TURBINATE REDUCTION Bilateral 03/10/2021   Procedure: BILATERAL TURBINATE REDUCTION;   Surgeon: Newman Pies, MD;  Location:  SUR[facility]ERY [facility];  Service: ENT;  Laterality: Bilateral;    Outpatient Medications Prior to Visit  Medication Sig Dispense Refill   adalimumab (HUMIRA) 40 MG/0.8ML AJKT pen Inject 1 Pen into the skin once a week. 12 each 3   escitalopram (LEXAPRO) 5 MG tablet Take 1 tablet (5 mg total) by mouth at bedtime. 30 tablet 1   finasteride (PROPECIA) 1 MG tablet Take 1 tablet (1 mg total) by mouth daily. 90 tablet 3   loratadine (CLARITIN REDITABS) 10 MG dissolvable tablet Take by mouth.     metroNIDAZOLE (METROGEL) 0.75 % gel Apply 1 small Application topically to face 2 (two) times daily. 45 g 2   mupirocin ointment (BACTROBAN) 2 % Apply to affected area three times daily for 7 days. 30 g 0   tretinoin (RETIN-A) 0.05 % cream 1 application on the skin NIGHTLY Use a few nights a week and increase to nightly as tolerated. 20 g 2   triamcinolone cream (KENALOG) 0.1 % Apply 1 small Application topically to affected area 2 (two) times daily for eczema. 80 g 1   EPINEPHrine (EPIPEN 2-PAK) 0.3 mg/0.3 mL IJ SOAJ injection Inject one epipen into the thigh muscle as needed for systemic reaction. May repeat dose once if needed (Patient not taking: Reported on 12/29/2023) 2 each  1   Fluocinolone Acetonide Body 0.01 % OIL Apply 1 small Application topically to affected area of scalp at bedtime for 2 weeks. (Patient not taking: Reported on 12/14/2023) 118.28 mL 3   fluticasone (FLONASE) 50 MCG/ACT nasal spray Use 2 sprays into both nostrils daily (Patient not taking: Reported on 12/29/2023) 16 g 5   metoprolol succinate (TOPROL-XL) 25 MG 24 hr tablet Take 1 tablet (25 mg total) by mouth daily. (Patient not taking: Reported on 12/14/2023) 30 tablet 1   tacrolimus (PROTOPIC) 0.1 % ointment apply topically to areas of frequent recurrence once daily. (Patient not taking: Reported on 12/14/2023) 60 g 5   No facility-administered medications prior to visit.    No Known  Allergies  Review of Systems  As per HPI  PE:    12/29/2023    2:00 PM 12/26/2023    4:08 PM 12/14/2023    9:37 AM  Vitals with BMI  Height 5\' 8"  5\' 8"  5\' 8"   Weight 203 lbs 10 oz 205 lbs 201 lbs  BMI 30.96 31.18 30.57  Systolic 114 122 045  Diastolic 74 80 82  Pulse 91 78 91     Physical Exam  Gen: Alert, well appearing.  Patient is oriented to person, place, time, and situation. AFFECT: pleasant, lucid thought and speech. ENT: Ears: EACs clear, normal epithelium.  TMs with good light reflex and landmarks bilaterally.  Eyes: no injection, icteris, swelling, or exudate.  EOMI, PERRLA. Nose: no drainage or turbinate edema/swelling.  No injection or focal lesion.  Mouth: lips without lesion/swelling.  Oral mucosa pink and moist.  Dentition intact and without obvious caries or gingival swelling.  Oropharynx without erythema, exudate, or swelling.  NECK: enlarged soft upper cervical lymph node swelling, oblong shape approx 5cm x 3cm.  Rubbery texture, mobile.  Similar smaller swelling in R lower ant cerv chain, approx 2cm x 2 cm.  No nodes on the left side.  No palpable thyroid tissue.  Submandibular glands are not palpable. CV: RRR, no m/r/g.   LUNGS: CTA bilat, nonlabored resps, good aeration in all lung fields. ABD: soft, NT, ND, BS normal.  No hepatospenomegaly or mass.  No bruits. SKIN: A bit of pinkish macular rash splotch around the neck and hairline, with a small fissure behind the left ear.  No swelling around the fissure, no erythema or drainage.  LABS:  Last CBC Lab Results  Component Value Date   WBC 8.7 12/26/2023   HGB 14.8 12/26/2023   HCT 44.8 12/26/2023   MCV 92.5 12/26/2023   MCH 31.2 09/10/2021   RDW 12.6 12/26/2023   PLT 370.0 12/26/2023   Last metabolic panel Lab Results  Component Value Date   GLUCOSE 92 12/26/2023   NA 136 12/26/2023   K 3.8 12/26/2023   CL 101 12/26/2023   CO2 27 12/26/2023   BUN 14 12/26/2023   CREATININE 0.83 12/26/2023   GFR  124.98 12/26/2023   CALCIUM 9.7 12/26/2023   PROT 8.5 (H) 12/26/2023   ALBUMIN 4.7 12/26/2023   BILITOT 0.3 12/26/2023   ALKPHOS 64 12/26/2023   AST 18 12/26/2023   ALT 24 12/26/2023   ANIONGAP 11 11/26/2019   Last thyroid functions Lab Results  Component Value Date   TSH 0.71 07/18/2023   T4TOTAL 7.7 07/18/2023   Lab Results  Component Value Date   ESRSEDRATE 34 (H) 12/26/2023   Lab Results  Component Value Date   CRP <1.0 12/26/2023   IMPRESSION AND PLAN:  Febrile illness  with tender cervical adenopathy. Symptoms worsening a bit (fever higher). A soft tissue neck ultrasound was ordered by his PCP 3 days ago.  We gave him the number to call and schedule this today. Recheck sed rate, CBC, and c-Met.  Also check CMV antibodies, EBV antibodies and a Monospot, Bartonella antibodies, QuantiFERON gold TB test  Start Keflex for possible bacterial lymphadenitis. Need to consider either heme-onc referral or interventional radiology referral for lymph node biopsy if all testing is negative and he does not improve over the next 4 to 5 days.  An After Visit Summary was printed and given to the patient.  FOLLOW UP: Return in about 4 days (around 01/02/2024) for recheck of current acute problem.  Signed:  Santiago Bumpers, MD           12/29/2023

## 2023-12-30 LAB — CBC WITH DIFFERENTIAL/PLATELET
Basophils Absolute: 0.1 10*3/uL (ref 0.0–0.1)
Basophils Relative: 1 % (ref 0.0–3.0)
Eosinophils Absolute: 0.1 10*3/uL (ref 0.0–0.7)
Eosinophils Relative: 1.5 % (ref 0.0–5.0)
HCT: 42.2 % (ref 39.0–52.0)
Hemoglobin: 14 g/dL (ref 13.0–17.0)
Lymphocytes Relative: 41.4 % (ref 12.0–46.0)
Lymphs Abs: 3.6 10*3/uL (ref 0.7–4.0)
MCHC: 33.1 g/dL (ref 30.0–36.0)
MCV: 93 fl (ref 78.0–100.0)
Monocytes Absolute: 1 10*3/uL (ref 0.1–1.0)
Monocytes Relative: 11.8 % (ref 3.0–12.0)
Neutro Abs: 3.8 10*3/uL (ref 1.4–7.7)
Neutrophils Relative %: 44.3 % (ref 43.0–77.0)
Platelets: 299 10*3/uL (ref 150.0–400.0)
RBC: 4.54 Mil/uL (ref 4.22–5.81)
RDW: 12.6 % (ref 11.5–15.5)
WBC: 8.6 10*3/uL (ref 4.0–10.5)

## 2023-12-30 LAB — COMPREHENSIVE METABOLIC PANEL
ALT: 29 U/L (ref 0–53)
AST: 22 U/L (ref 0–37)
Albumin: 4.5 g/dL (ref 3.5–5.2)
Alkaline Phosphatase: 64 U/L (ref 39–117)
BUN: 10 mg/dL (ref 6–23)
CO2: 26 meq/L (ref 19–32)
Calcium: 9.4 mg/dL (ref 8.4–10.5)
Chloride: 102 meq/L (ref 96–112)
Creatinine, Ser: 0.83 mg/dL (ref 0.40–1.50)
GFR: 124.97 mL/min (ref >60.00)
Glucose, Bld: 81 mg/dL (ref 70–99)
Potassium: 4 meq/L (ref 3.5–5.1)
Sodium: 138 meq/L (ref 135–145)
Total Bilirubin: 0.5 mg/dL (ref 0.2–1.2)
Total Protein: 8 g/dL (ref 6.0–8.3)

## 2023-12-30 LAB — SEDIMENTATION RATE: Sed Rate: 44 mm/h — ABNORMAL HIGH (ref 0–15)

## 2023-12-30 NOTE — Patient Instructions (Signed)

## 2024-01-02 ENCOUNTER — Encounter: Payer: Self-pay | Admitting: Internal Medicine

## 2024-01-02 ENCOUNTER — Ambulatory Visit (HOSPITAL_BASED_OUTPATIENT_CLINIC_OR_DEPARTMENT_OTHER)
Admission: RE | Admit: 2024-01-02 | Discharge: 2024-01-02 | Disposition: A | Discharge status: Home / Self Care | Source: Ambulatory Visit | Attending: Family Medicine | Admitting: Family Medicine

## 2024-01-02 ENCOUNTER — Encounter (HOSPITAL_BASED_OUTPATIENT_CLINIC_OR_DEPARTMENT_OTHER): Payer: Self-pay | Admitting: Emergency Medicine

## 2024-01-02 ENCOUNTER — Emergency Department (HOSPITAL_BASED_OUTPATIENT_CLINIC_OR_DEPARTMENT_OTHER)
Admission: EM | Admit: 2024-01-02 | Discharge: 2024-01-02 | Disposition: A | Discharge status: Home / Self Care | Attending: Emergency Medicine | Admitting: Emergency Medicine

## 2024-01-02 ENCOUNTER — Other Ambulatory Visit: Payer: Self-pay

## 2024-01-02 ENCOUNTER — Ambulatory Visit: Admitting: Family Medicine

## 2024-01-02 ENCOUNTER — Encounter: Payer: Self-pay | Admitting: Family Medicine

## 2024-01-02 ENCOUNTER — Telehealth: Payer: Self-pay

## 2024-01-02 ENCOUNTER — Emergency Department (HOSPITAL_BASED_OUTPATIENT_CLINIC_OR_DEPARTMENT_OTHER): Admitting: Radiology

## 2024-01-02 ENCOUNTER — Ambulatory Visit: Payer: Self-pay | Admitting: Internal Medicine

## 2024-01-02 VITALS — BP 137/82 | HR 119 | Temp 98.5°F | Ht 68.0 in

## 2024-01-02 DIAGNOSIS — R509 Fever, unspecified: Secondary | ICD-10-CM | POA: Insufficient documentation

## 2024-01-02 DIAGNOSIS — R590 Localized enlarged lymph nodes: Secondary | ICD-10-CM

## 2024-01-02 DIAGNOSIS — R791 Abnormal coagulation profile: Secondary | ICD-10-CM | POA: Insufficient documentation

## 2024-01-02 DIAGNOSIS — R000 Tachycardia, unspecified: Secondary | ICD-10-CM | POA: Insufficient documentation

## 2024-01-02 DIAGNOSIS — R5081 Fever presenting with conditions classified elsewhere: Secondary | ICD-10-CM

## 2024-01-02 DIAGNOSIS — Z0389 Encounter for observation for other suspected diseases and conditions ruled out: Secondary | ICD-10-CM | POA: Diagnosis not present

## 2024-01-02 LAB — CBC WITH DIFFERENTIAL/PLATELET
Abs Immature Granulocytes: 0.03 10*3/uL (ref 0.00–0.07)
Basophils Absolute: 0.1 10*3/uL (ref 0.0–0.1)
Basophils Relative: 1 %
Eosinophils Absolute: 0.1 10*3/uL (ref 0.0–0.5)
Eosinophils Relative: 1 %
HCT: 34.5 % — ABNORMAL LOW (ref 39.0–52.0)
Hemoglobin: 11.9 g/dL — ABNORMAL LOW (ref 13.0–17.0)
Immature Granulocytes: 0 %
Lymphocytes Relative: 42 %
Lymphs Abs: 5.7 10*3/uL — ABNORMAL HIGH (ref 0.7–4.0)
MCH: 30 pg (ref 26.0–34.0)
MCHC: 34.5 g/dL (ref 30.0–36.0)
MCV: 86.9 fL (ref 80.0–100.0)
Monocytes Absolute: 0.9 10*3/uL (ref 0.1–1.0)
Monocytes Relative: 7 %
Neutro Abs: 6.5 10*3/uL (ref 1.7–7.7)
Neutrophils Relative %: 49 %
Platelets: 286 10*3/uL (ref 150–400)
RBC: 3.97 MIL/uL — ABNORMAL LOW (ref 4.22–5.81)
RDW: 11.9 % (ref 11.5–15.5)
WBC: 13.3 10*3/uL — ABNORMAL HIGH (ref 4.0–10.5)
nRBC: 0 % (ref 0.0–0.2)

## 2024-01-02 LAB — COMPREHENSIVE METABOLIC PANEL
ALT: 52 U/L — ABNORMAL HIGH (ref 0–44)
AST: 33 U/L (ref 15–41)
Albumin: 4.2 g/dL (ref 3.5–5.0)
Alkaline Phosphatase: 79 U/L (ref 38–126)
Anion gap: 13 (ref 5–15)
BUN: 12 mg/dL (ref 6–20)
CO2: 21 mmol/L — ABNORMAL LOW (ref 22–32)
Calcium: 9.3 mg/dL (ref 8.9–10.3)
Chloride: 97 mmol/L — ABNORMAL LOW (ref 98–111)
Creatinine, Ser: 0.79 mg/dL (ref 0.61–1.24)
GFR, Estimated: >60 mL/min (ref >60)
Glucose, Bld: 93 mg/dL (ref 70–99)
Potassium: 3.3 mmol/L — ABNORMAL LOW (ref 3.5–5.1)
Sodium: 131 mmol/L — ABNORMAL LOW (ref 135–145)
Total Bilirubin: 1 mg/dL (ref 0.0–1.2)
Total Protein: 8.2 g/dL — ABNORMAL HIGH (ref 6.5–8.1)

## 2024-01-02 LAB — SEDIMENTATION RATE: Sed Rate: 62 mm/h — ABNORMAL HIGH (ref 0–16)

## 2024-01-02 LAB — LACTIC ACID, PLASMA
Lactic Acid, Venous: 1.5 mmol/L (ref 0.5–1.9)
Lactic Acid, Venous: 1 mmol/L (ref 0.5–1.9)

## 2024-01-02 LAB — URINALYSIS, W/ REFLEX TO CULTURE (INFECTION SUSPECTED)
Bacteria, UA: NONE SEEN
Bilirubin Urine: NEGATIVE
Glucose, UA: NEGATIVE mg/dL
Ketones, ur: NEGATIVE mg/dL
Leukocytes,Ua: NEGATIVE
Nitrite: NEGATIVE
Protein, ur: NEGATIVE mg/dL
Specific Gravity, Urine: <1.005 — ABNORMAL LOW (ref 1.005–1.030)
pH: 7.5 (ref 5.0–8.0)

## 2024-01-02 LAB — RESP PANEL BY RT-PCR (RSV, FLU A&B, COVID)  RVPGX2
Influenza A by PCR: NEGATIVE
Influenza B by PCR: NEGATIVE
Resp Syncytial Virus by PCR: NEGATIVE
SARS Coronavirus 2 by RT PCR: NEGATIVE

## 2024-01-02 LAB — MONONUCLEOSIS SCREEN: Mono Screen: NEGATIVE

## 2024-01-02 LAB — PROTIME-INR
INR: 1 (ref 0.8–1.2)
Prothrombin Time: 13.5 s (ref 11.4–15.2)

## 2024-01-02 MED ORDER — ACETAMINOPHEN 325 MG PO TABS
650.0000 mg | ORAL_TABLET | Freq: Once | ORAL | Status: AC
  Administered 2024-01-02: 650 mg via ORAL
  Filled 2024-01-02: qty 2

## 2024-01-02 NOTE — Telephone Encounter (Signed)
**Note De-identified  [place] Obfuscation** Please advise 

## 2024-01-02 NOTE — Telephone Encounter (Signed)
 Copied from CRM [id number]. Topic: General - Other >> Jan 02, 2024  8:11 AM Elizebeth Brooking wrote: Reason for CRM: Patient mom called in wanting to know if Dr.McGowen could get the results on the ultrasound as Stat so he can go over them at todays appointment with the patient

## 2024-01-02 NOTE — Telephone Encounter (Signed)
  Chief Complaint: fever 103.6 this am 3 am per patient mother. Shivering under blankets Symptoms: temp at 3 am 103.6. shivering under blankets per pt mother not with patient now due to she has positive covid. Frequency: this am  Pertinent Negatives: Patient denies difficulty breathing  Disposition: [] ED /[] Urgent Care (no appt availability in office) / [x] Appointment(In office/virtual)/ []  [facility]/ [] Home Care/ [] Refused Recommended Disposition /[]  Mobile Bus/ []  Follow-up with PCP Additional Notes     Recommended to recheck temp this am . If tempt remains 103.6 and sx worsen  go to ED. F/u appt scheduled for this afternoon. Patient mother requesting if labs "show anything'. Please advise.      Reason for Disposition  Severe chills (i.e., feeling extremely cold WITH shaking chills)  Answer Assessment - Initial Assessment Questions 1. TEMPERATURE: "What is the most recent temperature?"  "How was it measured?"      This am 3am 103.6 2. ONSET: "When did the fever start?"     Last Wednesday  3. CHILLS: "Do you have chills?" If yes: "How bad are they?"  (e.g., none, mild, moderate, severe)   - NONE: no chills   - MILD: feeling cold   - MODERATE: feeling very cold, some shivering (feels better under a thick blanket)   - SEVERE: feeling extremely cold with shaking chills (general body shaking, rigors; even under a thick blanket)      Shivering under blankets 4. OTHER SYMPTOMS: "Do you have any other symptoms besides the fever?"  (e.g., abdomen pain, cough, diarrhea, earache, headache, sore throat, urination pain)     Headache fever , swollen lymph nodes 5. CAUSE: If there are no symptoms, ask: "What do you think is causing the fever?"      Unknown  6. CONTACTS: "Does anyone else in the family have an infection?"     Yes mother has covid  7. TREATMENT: "What have you done so far to treat this fever?" (e.g., medications)     Keflex, Tylenol and advil rotating  8.  IMMUNOCOMPROMISE: "Do you have of the following: diabetes, HIV positive, splenectomy, cancer chemotherapy, chronic steroid treatment, transplant patient, etc."     na 9. PREGNANCY: "Is there any chance you are pregnant?" "When was your last menstrual period?"     na 10. TRAVEL: "Have you traveled out of the country in the last month?" (e.g., travel history, exposures)       na  Protocols used: [facility]

## 2024-01-02 NOTE — Discharge Instructions (Signed)
 Please finish the antibiotic course for now. Alternate between Tylenol and Motrin, you may take it every 3 hours if needed for fever control.  Call your PCP for a follow-up next week.  Consider following up with infectious disease doctor if your symptoms are not getting better.

## 2024-01-02 NOTE — ED Triage Notes (Signed)
 Patient c/o swollen lymph nodes and fever x 2 weeks.  Patient has gotten full work-up at PCP.  Patient has had exposure to covid but has tested negative. Patient is on humira.  Patient last took anti-pyretic at 0100.

## 2024-01-02 NOTE — Telephone Encounter (Signed)
 See previous triage note today .   Copied from CRM [id number]. Topic: Clinical - Pink Word Triage >> Jan 02, 2024  8:14 AM Jon Gills C wrote: Reason for Triage: Patients mom called in stating that the patient has been running a constant fever of 102 for the whole weekend. Does as a 4 oclock appointment today but wants to know if she may need to take him to the ER before wants a call back at [phone] regarding this issue >> Jan 02, 2024  8:45 AM Orinda Kenner C wrote: Patient's mother Areta Haber [phone] calling back and states patient has an ultrasound at 10 am and follow up with Dr. Milinda Cave at 4 pm today. Patient states patient is still having a fever 103.6, headaches, chills, and fatigue. Patient is taking Keflex and it's not helping and asking if they need to go to the ER or wait for the office visit with Dr. Milinda Cave today? Areta Haber has Covid and is not with patient, she is taking her of the patient remotely. Please advise.  This encounter was created in error - please disregard. Reason for Disposition  Caller has already spoken with another triager and has no further questions.  Answer Assessment - Initial Assessment Questions N/A See previous triage. Patient already triaged this am .  Protocols used: No Contact or Duplicate Contact Call-A-AH

## 2024-01-02 NOTE — Telephone Encounter (Signed)
 I called the [facility] radiology reading room and spoke with them - they will do their best to get it read before appt time today, but they do have several stats (strokes) ahead of this one.   I will let patient know.

## 2024-01-02 NOTE — ED Provider Notes (Signed)
 [place] EMERGENCY DEPARTMENT AT [facility] Provider Note   CSN: [id number] Arrival date & time: 01/02/24  1722     History  Chief Complaint  Patient presents with   Fever    Billy Allen is a 23 y.o. male.  HPI    23 year old male with history of Crohn's disease on Humira comes in with chief complaint of fevers.  Patient developed fever that lasted about 2 or 3 days about 3 weeks ago.  About a week later, he developed pain over his neck.  Patient followed with PCP.  They had few labs, titers and also ultrasound of the soft tissue neck.  About a week after, patient started developing fever.  Fever have been now persistent for at least 5 days.  Patient notes that the fevers peak in the evening.  He has been taking Tylenol or ibuprofen every 6 hours.  Review of system is negative for any new URI-like symptoms, nausea, vomiting, diarrhea, abdominal pain, UTI-like symptoms, new rashes.  Patient denies any profound neck pain, severe headaches or confusion.  Patient went to PCP today, he looked fairly ill and felt like he was going to faint, therefore he was advised to come to the ER.  Patient is taking Keflex for the enlarged lymph node with no response.  Father at the bedside.  Mother has COVID, and is on the phone.  They provide collateral history as well.  Home Medications Prior to Admission medications   Medication Sig Start Date End Date Taking? Authorizing Provider  adalimumab (HUMIRA) 40 MG/0.8ML AJKT pen Inject 1 Pen into the skin once a week. 03/22/23   Quentin Angst, MD  cephALEXin (KEFLEX) 500 MG capsule Take 1 capsule (500 mg total) by mouth 4 (four) times daily. 12/29/23   McGowen, Maryjean Morn, MD  EPINEPHrine (EPIPEN 2-PAK) 0.3 mg/0.3 mL IJ SOAJ injection Inject one epipen into the thigh muscle as needed for systemic reaction. May repeat dose once if needed Patient not taking: Reported on 12/26/2023 06/29/23     escitalopram (LEXAPRO) 5 MG tablet Take 1 tablet  (5 mg total) by mouth at bedtime. 12/08/23   Henson, Vickie L, NP-C  finasteride (PROPECIA) 1 MG tablet Take 1 tablet (1 mg total) by mouth daily. 06/29/23     Fluocinolone Acetonide Body 0.01 % OIL Apply 1 small Application topically to affected area of scalp at bedtime for 2 weeks. 08/04/23     fluticasone (FLONASE) 50 MCG/ACT nasal spray Use 2 sprays into both nostrils daily 10/06/21     loratadine (CLARITIN REDITABS) 10 MG dissolvable tablet Take by mouth.    [provider]  metoprolol succinate (TOPROL-XL) 25 MG 24 hr tablet Take 1 tablet (25 mg total) by mouth daily. 12/08/23   Henson, Vickie L, NP-C  metroNIDAZOLE (METROGEL) 0.75 % gel Apply 1 small Application topically to face 2 (two) times daily. 08/04/23     mupirocin ointment (BACTROBAN) 2 % Apply to affected area three times daily for 7 days. 12/26/23   Gwenlyn Fudge, FNP  tacrolimus (PROTOPIC) 0.1 % ointment apply topically to areas of frequent recurrence once daily. 09/30/23     tretinoin (RETIN-A) 0.05 % cream 1 application on the skin NIGHTLY Use a few nights a week and increase to nightly as tolerated. 07/01/23     triamcinolone cream (KENALOG) 0.1 % Apply 1 small Application topically to affected area 2 (two) times daily for eczema. 12/26/23   Gwenlyn Fudge, FNP  amitriptyline (ELAVIL) 25  MG tablet Take 1 tablet (25 mg total) by mouth at bedtime as needed for sleep. 02/25/20 03/31/20  Salem Senate, MD      Allergies    Patient has no known allergies.    Review of Systems   Review of Systems  All other systems reviewed and are negative.   Physical Exam Updated Vital Signs BP 112/71   Pulse 97   Temp 100.2 F (37.9 C) (Oral)   Resp 20   Wt 90.7 kg   SpO2 98%   BMI 30.41 kg/m  Physical Exam Vitals and nursing note reviewed.  Constitutional:      Appearance: He is well-developed. He is ill-appearing. He is not toxic-appearing.  HENT:     Head: Atraumatic.  Cardiovascular:     Rate and Rhythm:  Tachycardia present.     Heart sounds: No murmur heard. Pulmonary:     Effort: Pulmonary effort is normal.     Breath sounds: No wheezing.  Musculoskeletal:     Cervical back: Neck supple.  Lymphadenopathy:     Cervical: Cervical adenopathy present.  Skin:    General: Skin is warm.  Neurological:     Mental Status: He is oriented to person, place, and time.     ED Results / Procedures / Treatments   Labs (all labs ordered are listed, but only abnormal results are displayed) Labs Reviewed  COMPREHENSIVE METABOLIC PANEL - Abnormal; Notable for the following components:      Result Value   Sodium 131 (*)    Potassium 3.3 (*)    Chloride 97 (*)    CO2 21 (*)    Total Protein 8.2 (*)    ALT 52 (*)    All other components within normal limits  CBC WITH DIFFERENTIAL/PLATELET - Abnormal; Notable for the following components:   WBC 13.3 (*)    RBC 3.97 (*)    Hemoglobin 11.9 (*)    HCT 34.5 (*)    Lymphs Abs 5.7 (*)    All other components within normal limits  URINALYSIS, W/ REFLEX TO CULTURE (INFECTION SUSPECTED) - Abnormal; Notable for the following components:   Color, Urine COLORLESS (*)    Specific Gravity, Urine <1.005 (*)    Hgb urine dipstick TRACE (*)    All other components within normal limits  SEDIMENTATION RATE - Abnormal; Notable for the following components:   Sed Rate 62 (*)    All other components within normal limits  RESP PANEL BY RT-PCR (RSV, FLU A&B, COVID)  RVPGX2  CULTURE, BLOOD (ROUTINE X 2)  CULTURE, BLOOD (ROUTINE X 2)  LACTIC ACID, PLASMA  LACTIC ACID, PLASMA  PROTIME-INR  C-REACTIVE PROTEIN    EKG None  Radiology DG Chest 2 View Result Date: 01/02/2024 CLINICAL DATA:  Suspected sepsis EXAM: CHEST - 2 VIEW COMPARISON:  Chest x-ray 12/26/2023 FINDINGS: The heart size and mediastinal contours are within normal limits. Both lungs are clear. The visualized skeletal structures are unremarkable. IMPRESSION: No active cardiopulmonary disease.  Electronically Signed   By: Darliss Cheney M.D.   On: 01/02/2024 21:11   US Soft Tissue Head/Neck (NON-THYROID) Result Date: 01/02/2024 CLINICAL DATA:  Cervical lymphadenopathy and fever EXAM: ULTRASOUND OF HEAD/NECK SOFT TISSUES TECHNIQUE: Ultrasound examination of the head and neck soft tissues was performed in the area of clinical concern. COMPARISON:  None Available. FINDINGS: There are multiple enlarged right-sided cervical lymph nodes. The 2 largest nodes measure 2.2 x 1.6 x 2.0 cm and 2.6 x 1.0 x 2.1  cm. There is no abscess or evidence of suppurative adenopathy. IMPRESSION: Right cervical lymphadenopathy without abscess or suppurative adenopathy. Electronically Signed   By: Deatra Robinson M.D.   On: 01/02/2024 20:56    Procedures Procedures    Medications Ordered in ED Medications  acetaminophen (TYLENOL) tablet 650 mg (650 mg Oral Given 01/02/24 1905)    ED Course/ Medical Decision Making/ A&P                                 Medical Decision Making Amount and/or Complexity of Data Reviewed Labs: ordered. Radiology: ordered.  Risk OTC drugs.   23 year old male with history of Crohn's disease on Humira comes in with chief complaint of fever.  It appears that patient's symptoms have been ongoing over the last several days.  Fevers have no specific origin based on history alone.  Patient has been seen by PCP, he had ultrasound soft tissue neck completed recently along with several infection related labs which were also normal.  I reviewed patient's records including PCP notes, recent lab workup.  I called radiologist and went over patient's ultrasound that was completed today.  Ultrasound reveals reactive lymphadenopathy per radial just.  No other concerning features.  No abscess noted.  Patient clearly does have cervical lymphadenopathy, but otherwise he has reassuring exam.  I consulted Dr. Luciana Axe, infectious disease and went over patient's symptoms and chronology of  it.  Dr. Luciana Axe suspects that most likely patient's symptoms are because of viral illness and fever that is lingering due to his immunosuppression.  Patient's lab today reveals slight elevation in white count.  Otherwise sed rate is actually better.  Metabolic profile is overall reassuring.  Patient received antipyretic, and his temperature came down.  He is not toxic appearing, but definitely ill-appearing.  I reassured the parents.  I discussed that the CBC line overall looks well.  There is no indication for hematology consultation at this time.  I discussed that patient should likely follow-up with PCP and consider following with infectious disease doctor if needed next week.  Family is okay with the plan. Final Clinical Impression(s) / ED Diagnoses Final diagnoses:  Fever, unspecified fever cause  Cervical lymphadenopathy    Rx / DC Orders ED Discharge Orders          Ordered    Ambulatory referral to Infectious Disease        01/02/24 2140              Derwood Kaplan, MD 01/02/24 2146

## 2024-01-02 NOTE — Progress Notes (Signed)
 OFFICE VISIT  01/02/2024  CC:  Chief Complaint  Patient presents with   Follow-up    Patient is a 23 y.o. male who presents accompanied by his stepfather for 4-day follow-up cervical lymphadenopathy. A/P as of last visit: "Febrile illness with tender cervical adenopathy. Symptoms worsening a bit (fever higher). A soft tissue neck ultrasound was ordered by his PCP 3 days ago.  We gave him the number to call and schedule this today. Recheck sed rate, CBC, and c-Met.  Also check CMV antibodies, EBV antibodies and a Monospot, Bartonella antibodies, QuantiFERON gold TB test   Start Keflex for possible bacterial lymphadenitis. Need to consider either hem-onc referral or interventional radiology referral for lymph node biopsy if all testing is negative and he does not improve over the next 4 to 5 days."  INTERIM HX: Our labs last visit showed sed rate of 44, otherwise unremarkable/negative.  Bartonella antibodies pending.  He had his neck ultrasound today, results pending.  He still has persistent fever to 102, feels very tired, otherwise no new sx's. Still has R neck nodular swelling unchanged.  ROS: no URI/cough, no ST, no HA, no myalgias/arthralgias, no n/v/d, no urinary sx's, no rash, no abd pain. He feels dizzy when he has fever.   Past Medical History:  Diagnosis Date   Allergy    Crohn's disease (HCC)    Depression    Eczema    Uveitis     Past Surgical History:  Procedure Laterality Date   TURBINATE REDUCTION Bilateral 03/10/2021   Procedure: BILATERAL TURBINATE REDUCTION;  Surgeon: Newman Pies, MD;  Location: [facility];  Service: ENT;  Laterality: Bilateral;    Outpatient Medications Prior to Visit  Medication Sig Dispense Refill   adalimumab (HUMIRA) 40 MG/0.8ML AJKT pen Inject 1 Pen into the skin once a week. 12 each 3   cephALEXin (KEFLEX) 500 MG capsule Take 1 capsule (500 mg total) by mouth 4 (four) times daily. 28 capsule 0   escitalopram  (LEXAPRO) 5 MG tablet Take 1 tablet (5 mg total) by mouth at bedtime. 30 tablet 1   finasteride (PROPECIA) 1 MG tablet Take 1 tablet (1 mg total) by mouth daily. 90 tablet 3   Fluocinolone Acetonide Body 0.01 % OIL Apply 1 small Application topically to affected area of scalp at bedtime for 2 weeks. 118.28 mL 3   fluticasone (FLONASE) 50 MCG/ACT nasal spray Use 2 sprays into both nostrils daily 16 g 5   loratadine (CLARITIN REDITABS) 10 MG dissolvable tablet Take by mouth.     metoprolol succinate (TOPROL-XL) 25 MG 24 hr tablet Take 1 tablet (25 mg total) by mouth daily. 30 tablet 1   metroNIDAZOLE (METROGEL) 0.75 % gel Apply 1 small Application topically to face 2 (two) times daily. 45 g 2   mupirocin ointment (BACTROBAN) 2 % Apply to affected area three times daily for 7 days. 30 g 0   tacrolimus (PROTOPIC) 0.1 % ointment apply topically to areas of frequent recurrence once daily. 60 g 5   tretinoin (RETIN-A) 0.05 % cream 1 application on the skin NIGHTLY Use a few nights a week and increase to nightly as tolerated. 20 g 2   triamcinolone cream (KENALOG) 0.1 % Apply 1 small Application topically to affected area 2 (two) times daily for eczema. 80 g 1   EPINEPHrine (EPIPEN 2-PAK) 0.3 mg/0.3 mL IJ SOAJ injection Inject one epipen into the thigh muscle as needed for systemic reaction. May repeat dose once if needed (  Patient not taking: Reported on 12/26/2023) 2 each 1   No facility-administered medications prior to visit.    No Known Allergies  Review of Systems As per HPI  PE:    01/02/2024    4:37 PM 01/02/2024    3:58 PM 12/29/2023    2:00 PM  Vitals with BMI  Height  5\' 8"  5\' 8"   Weight   203 lbs 10 oz  BMI   30.96  Systolic  137 114  Diastolic  82 74  Pulse 119 124 91     Physical Exam  Gen: Alert, ill- appearing.  Patient is oriented to person, place, time, and situation. Dizzy and SOB, had to sit down when in the process of weighing at check in. Throat w/out erythema or  swelling, no oral lesions.  Mucosa moist.   NECK: swollen and mildly tender LAD on Right anterior neck. CV: regular, tachycardic to 120s, no murmur LUNGS: CTA bilat, mildly labored resps. ABD: soft, NT/ND EXT: no edema  LABS:  Last CBC Lab Results  Component Value Date   WBC 8.6 12/29/2023   HGB 14.0 12/29/2023   HCT 42.2 12/29/2023   MCV 93.0 12/29/2023   MCH 31.2 09/10/2021   RDW 12.6 12/29/2023   PLT 299.0 12/29/2023   Last metabolic panel Lab Results  Component Value Date   GLUCOSE 81 12/29/2023   NA 138 12/29/2023   K 4.0 12/29/2023   CL 102 12/29/2023   CO2 26 12/29/2023   BUN 10 12/29/2023   CREATININE 0.83 12/29/2023   GFR 124.97 12/29/2023   CALCIUM 9.4 12/29/2023   PROT 8.0 12/29/2023   ALBUMIN 4.5 12/29/2023   BILITOT 0.5 12/29/2023   ALKPHOS 64 12/29/2023   AST 22 12/29/2023   ALT 29 12/29/2023   ANIONGAP 11 11/26/2019   Last thyroid functions Lab Results  Component Value Date   TSH 0.71 07/18/2023   T4TOTAL 7.7 07/18/2023   Lab Results  Component Value Date   ESRSEDRATE 44 (H) 12/29/2023   IMPRESSION AND PLAN:  Cervical lymphadenopathy with persistent fever x 7d. No change with empiric trial of keflex. Large lab panel normal other than elevated sed rate (34-44). Neck ultrasound was done earlier today, results pending. He is at the point where he needs specialist evaluation to see next step-->? Biopsy. Clifton Custard feels horrible. Discussed today with patient and his stepfather, who is a physician. Decided to go to the emergency department for expedited retesting and possibly specialist consultation and/or admission.  An After Visit Summary was printed and given to the patient.  FOLLOW UP: Return for TBD based on ED evaluation.  Signed:  Santiago Bumpers, MD           01/02/2024

## 2024-01-02 NOTE — Telephone Encounter (Addendum)
 This RN second attempt to contact patient for triage. No answer, voicemail left requesting return call to clinic.   This RN first attempt to contact patient for triage. No answer, voicemail left requesting return call to clinic.

## 2024-01-03 LAB — EPSTEIN-BARR VIRUS NUCLEAR ANTIGEN ANTIBODY, IGG: EBV NA IgG: 246 U/mL — ABNORMAL HIGH

## 2024-01-03 LAB — CMV ABS, IGG+IGM (CYTOMEGALOVIRUS)
CMV IgM: <30 [AU]/ml
Cytomegalovirus Ab-IgG: <0.6 U/mL

## 2024-01-03 LAB — RFLX B. HENSELAE IGM TITER: B Henselae IgM Titer: >=1:320 {titer} — ABNORMAL HIGH

## 2024-01-03 LAB — QUANTIFERON-TB GOLD PLUS
Mitogen-NIL: 7.84 [IU]/mL
NIL: 1.42 [IU]/mL
QuantiFERON-TB Gold Plus: NEGATIVE
TB1-NIL: <0 [IU]/mL
TB2-NIL: <0 [IU]/mL

## 2024-01-03 LAB — BARTONELLA ANTIBODY PANEL
B. henselae IgG Screen: POSITIVE — AB
B. henselae IgM Screen: POSITIVE — AB

## 2024-01-03 LAB — RFLX B. HENSELAE IGG TITER: B. HENSELAE AB (IGG), TITER: 1:128 {titer} — ABNORMAL HIGH

## 2024-01-03 LAB — C-REACTIVE PROTEIN: CRP: 10.2 mg/dL — ABNORMAL HIGH (ref <1.0)

## 2024-01-03 LAB — EPSTEIN-BARR VIRUS EARLY D ANTIGEN ANTIBODY, IGG: EBV EA IgG: 70.2 U/mL — ABNORMAL HIGH (ref <9.00)

## 2024-01-04 ENCOUNTER — Ambulatory Visit: Admitting: Urgent Care

## 2024-01-04 ENCOUNTER — Encounter: Payer: Self-pay | Admitting: Urgent Care

## 2024-01-04 ENCOUNTER — Encounter: Payer: Self-pay | Admitting: Family Medicine

## 2024-01-04 ENCOUNTER — Other Ambulatory Visit (HOSPITAL_COMMUNITY): Payer: Self-pay

## 2024-01-04 VITALS — BP 118/77 | HR 86 | Temp 97.9°F | Wt 201.0 lb

## 2024-01-04 DIAGNOSIS — R590 Localized enlarged lymph nodes: Secondary | ICD-10-CM

## 2024-01-04 DIAGNOSIS — A449 Bartonellosis, unspecified: Secondary | ICD-10-CM | POA: Diagnosis not present

## 2024-01-04 DIAGNOSIS — L308 Other specified dermatitis: Secondary | ICD-10-CM

## 2024-01-04 MED ORDER — EUCRISA 2 % EX OINT
TOPICAL_OINTMENT | CUTANEOUS | 2 refills | Status: AC
  Filled 2024-01-04: qty 100, 30d supply, fill #0
  Filled 2024-01-04: qty 100, fill #0

## 2024-01-04 MED ORDER — AZITHROMYCIN 250 MG PO TABS
ORAL_TABLET | ORAL | 0 refills | Status: DC
  Filled 2024-01-04 (×2): qty 6, 5d supply, fill #0

## 2024-01-04 NOTE — Progress Notes (Signed)
 Established Patient Office Visit  Subjective:  Patient ID: Billy Allen, male    DOB: 2000/11/29  Age: 23 y.o. MRN: [id number]  Chief Complaint  Patient presents with   Follow-up    Pt was seen by Dr Milinda Cave on Monday and is following up on some labs. He had a cat that was infected with fleas.    Pleasant 23yo male presents for follow up. Has been having fevers and severe R sided cervical lymphadenopathy. Testing revealed positive bartonella henselae test. Pt admits to having a cat with fleas recently, believes that the cat transmitted the infection through a fissure on his R ear (pt has chronic eczema, and R posterior ear seems to be a problematic area for him).  Still having pain to the R cervical lymph chain, fevers controlled. Pt denies abdominal pain, nausea or vomiting. No additional systemic symptoms present.    Patient Active Problem List   Diagnosis Date Noted   High risk medication use 12/14/2023   Tachycardia 07/18/2023   Gynecomastia, male 04/26/2022   Low libido 04/26/2022   Encounter for general adult medical examination with abnormal findings 09/24/2021   Crohn's disease (HCC) 03/31/2020   Fever 11/24/2019   Past Medical History:  Diagnosis Date   Allergy    Crohn's disease (HCC)    Depression    Eczema    Uveitis    Past Surgical History:  Procedure Laterality Date   TURBINATE REDUCTION Bilateral 03/10/2021   Procedure: BILATERAL TURBINATE REDUCTION;  Surgeon: Newman Pies, MD;  Location: [facility];  Service: ENT;  Laterality: Bilateral;   Social History   Tobacco Use   Smoking status: Never   Smokeless tobacco: Never  Vaping Use   Vaping status: Never Used  Substance Use Topics   Alcohol use: Never   Drug use: Never      ROS: as noted in HPI  Objective:     BP 118/77   Pulse 86   Temp 97.9 F (36.6 C) (Oral)   Wt 201 lb (91.2 kg)   SpO2 97%   BMI 30.56 kg/m  BP Readings from Last 3 Encounters:  01/04/24 118/77  01/02/24  112/71  01/02/24 137/82   Wt Readings from Last 3 Encounters:  01/04/24 201 lb (91.2 kg)  01/02/24 200 lb (90.7 kg)  12/29/23 203 lb 9.6 oz (92.4 kg)      Physical Exam Vitals and nursing note reviewed.  Constitutional:      General: He is not in acute distress.    Appearance: Normal appearance. He is not ill-appearing, toxic-appearing or diaphoretic.  HENT:     Head: Normocephalic and atraumatic.     Right Ear: No swelling or tenderness. No middle ear effusion. There is no impacted cerumen. Tympanic membrane is not injected, scarred, perforated or erythematous.     Left Ear: No swelling or tenderness.  No middle ear effusion. There is no impacted cerumen. Tympanic membrane is not injected, scarred, perforated or erythematous.     Ears:     Comments: Fissure with crusting and scaling to posterior R ear lobe crease    Mouth/Throat:     Mouth: Mucous membranes are moist.     Pharynx: No oropharyngeal exudate or posterior oropharyngeal erythema.  Eyes:     General: No scleral icterus.       Right eye: No discharge.        Left eye: No discharge.     Extraocular Movements: Extraocular movements intact.  Pupils: Pupils are equal, round, and reactive to light.  Neck:   Cardiovascular:     Rate and Rhythm: Normal rate.  Pulmonary:     Effort: Pulmonary effort is normal. No respiratory distress.  Abdominal:     General: Abdomen is flat. Bowel sounds are normal. There is no distension.     Palpations: Abdomen is soft. There is no mass.     Tenderness: There is no abdominal tenderness. There is no guarding or rebound.     Hernia: No hernia is present.  Musculoskeletal:     Cervical back: Normal range of motion and neck supple. Tenderness present.     Comments: NO axillary lymphadenopathy present  Lymphadenopathy:     Cervical: Cervical adenopathy present.  Skin:    General: Skin is warm.     Findings: Rash (eczema noted, primarily flexural) present.  Neurological:      General: No focal deficit present.     Mental Status: He is alert and oriented to person, place, and time.      No results found for any visits on 01/04/24.  Last CBC Lab Results  Component Value Date   WBC 13.3 (H) 01/02/2024   HGB 11.9 (L) 01/02/2024   HCT 34.5 (L) 01/02/2024   MCV 86.9 01/02/2024   MCH 30.0 01/02/2024   RDW 11.9 01/02/2024   PLT 286 01/02/2024   Last metabolic panel Lab Results  Component Value Date   GLUCOSE 93 01/02/2024   NA 131 (L) 01/02/2024   K 3.3 (L) 01/02/2024   CL 97 (L) 01/02/2024   CO2 21 (L) 01/02/2024   BUN 12 01/02/2024   CREATININE 0.79 01/02/2024   GFRNONAA >60 01/02/2024   CALCIUM 9.3 01/02/2024   PROT 8.2 (H) 01/02/2024   ALBUMIN 4.2 01/02/2024   BILITOT 1.0 01/02/2024   ALKPHOS 79 01/02/2024   AST 33 01/02/2024   ALT 52 (H) 01/02/2024   ANIONGAP 13 01/02/2024      The ASCVD Risk score (Arnett DK, et al., 2019) failed to calculate for the following reasons:   The 2019 ASCVD risk score is only valid for ages 58 to 84  Assessment & Plan:  Bartonella infection -     Azithromycin; Take 2 tablets on day 1, then 1 tablet daily on days 2 through 5  Dispense: 6 tablet; Refill: 0  Cervical lymphadenopathy -     Azithromycin; Take 2 tablets on day 1, then 1 tablet daily on days 2 through 5  Dispense: 6 tablet; Refill: 0  Other eczema -     Eucrisa; Apply twice daily to affected areas of skin. Once control noted, switch to once daily  Dispense: 100 g; Refill: 2  Start azithromycin per package directions. Monitor for symptom regression and follow up with PCP if new symptoms develop.  Start Eucrisa for eczema control and flare ups.   No follow-ups on file.   Maretta Bees, PA

## 2024-01-04 NOTE — Patient Instructions (Addendum)
 Please read the attached handout regarding cat-scratch infections. Please stop the cephalexin and start taking Azithromycin per package directions.  Monitor for improvement in your symptoms; return if any new or worsening symptoms despite treatment.  I have called in [place] for you to try. This is a non-steroidal cream to help with your eczema. Use this in place of your tacrolimus or triamcinolone.   Please follow up with your PCP for refills if effective.

## 2024-01-05 ENCOUNTER — Ambulatory Visit: Payer: Commercial Managed Care - PPO | Admitting: Internal Medicine

## 2024-01-06 ENCOUNTER — Other Ambulatory Visit (HOSPITAL_COMMUNITY): Payer: Self-pay

## 2024-01-06 ENCOUNTER — Encounter: Payer: Self-pay | Admitting: Student in an Organized Health Care Education/Training Program

## 2024-01-06 ENCOUNTER — Telehealth: Payer: Self-pay | Admitting: Gastroenterology

## 2024-01-06 ENCOUNTER — Other Ambulatory Visit: Payer: Self-pay

## 2024-01-06 ENCOUNTER — Ambulatory Visit: Admitting: Student in an Organized Health Care Education/Training Program

## 2024-01-06 VITALS — BP 112/70 | HR 109 | Temp 102.2°F | Wt 201.0 lb

## 2024-01-06 DIAGNOSIS — A449 Bartonellosis, unspecified: Secondary | ICD-10-CM

## 2024-01-06 LAB — CBC WITH DIFFERENTIAL/PLATELET
Basophils Absolute: 0.1 10*3/uL (ref 0.0–0.1)
Basophils Relative: 0.6 % (ref 0.0–3.0)
Eosinophils Absolute: 0.1 10*3/uL (ref 0.0–0.7)
Eosinophils Relative: 0.5 % (ref 0.0–5.0)
HCT: 36.1 % — ABNORMAL LOW (ref 39.0–52.0)
Hemoglobin: 11.9 g/dL — ABNORMAL LOW (ref 13.0–17.0)
Lymphocytes Relative: 48.1 % — ABNORMAL HIGH (ref 12.0–46.0)
Lymphs Abs: 6.4 10*3/uL — ABNORMAL HIGH (ref 0.7–4.0)
MCHC: 32.9 g/dL (ref 30.0–36.0)
MCV: 90.7 fl (ref 78.0–100.0)
Monocytes Absolute: 1.4 10*3/uL — ABNORMAL HIGH (ref 0.1–1.0)
Monocytes Relative: 10.3 % (ref 3.0–12.0)
Neutro Abs: 5.4 10*3/uL (ref 1.4–7.7)
Neutrophils Relative %: 40.5 % — ABNORMAL LOW (ref 43.0–77.0)
Platelets: 461 10*3/uL — ABNORMAL HIGH (ref 150.0–400.0)
RBC: 3.98 Mil/uL — ABNORMAL LOW (ref 4.22–5.81)
RDW: 12.9 % (ref 11.5–15.5)
WBC: 13.3 10*3/uL — ABNORMAL HIGH (ref 4.0–10.5)

## 2024-01-06 LAB — COMPREHENSIVE METABOLIC PANEL
ALT: 66 U/L — ABNORMAL HIGH (ref 0–53)
AST: 34 U/L (ref 0–37)
Albumin: 3.9 g/dL (ref 3.5–5.2)
Alkaline Phosphatase: 101 U/L (ref 39–117)
BUN: 9 mg/dL (ref 6–23)
CO2: 26 meq/L (ref 19–32)
Calcium: 9.4 mg/dL (ref 8.4–10.5)
Chloride: 97 meq/L (ref 96–112)
Creatinine, Ser: 0.83 mg/dL (ref 0.40–1.50)
GFR: 124.96 mL/min (ref >60.00)
Glucose, Bld: 83 mg/dL (ref 70–99)
Potassium: 4.1 meq/L (ref 3.5–5.1)
Sodium: 132 meq/L — ABNORMAL LOW (ref 135–145)
Total Bilirubin: 0.6 mg/dL (ref 0.2–1.2)
Total Protein: 8.8 g/dL — ABNORMAL HIGH (ref 6.0–8.3)

## 2024-01-06 MED ORDER — DOXYCYCLINE HYCLATE 100 MG PO TABS
100.0000 mg | ORAL_TABLET | Freq: Two times a day (BID) | ORAL | 0 refills | Status: AC
  Filled 2024-01-06 (×2): qty 56, 28d supply, fill #0

## 2024-01-06 MED ORDER — RIFAMPIN 300 MG PO CAPS
300.0000 mg | ORAL_CAPSULE | Freq: Two times a day (BID) | ORAL | 0 refills | Status: AC
  Filled 2024-01-06 (×2): qty 28, 14d supply, fill #0

## 2024-01-06 NOTE — Progress Notes (Signed)
 Acute Office Visit  Subjective:     Patient ID: Billy Allen, male    DOB: May 04, 2001, 23 y.o.   MRN: [id number]  Chief Complaint  Patient presents with   Fever    Started 10 days with a constant fever that would reach to 103 was seen and had test done, cat scratch fever came back high. Was given an antibiotic and did not help, Has been seen multiple times and then was prescribed azithromycin but still spiking fevers 101.6. Patient is on humira as well. Does have an infection disease consultant but not for another 8 days. No seizures. Bodyaches,chills, lethargic. Has been tested for covid/flu and was negative.     HPI  Patient is in today for febrile illness.  23 year old person who has Crohn's disease immunosuppressed on Humira here for reevaluation of a febrile illness that is been going on for 2 weeks.  He has had a few visits with his primary care physician's office as well as the emergency departments.  Workup so far has shown a significantly elevated IgM titers of Bartonella.  He started azithromycin 3 days ago, says he is feeling a little bit better today, but still feels really poorly and is still having daily fevers.  He reports the fatigue is the worst part, feels worn out.  No cough, no dyspnea, no chest pain, no abdominal pain, no dysuria or urethral discharge, no headache, no neck stiffness.  He reports no new rashes.       Objective:    BP 112/70   Pulse (!) 109   Temp (!) 102.2 F (39 C) (Oral)   Wt 201 lb (91.2 kg)   SpO2 100%   BMI 30.56 kg/m    Physical Exam  Gen: Tired appearing young man Mouth: Normal, mildly erythematous posterior oropharynx, mild swelling of the tonsils bilaterally, no exudate Ears: Normal tympanic membranes bilaterally Neck: 2 cm tender cervical lymphadenitis on the right side beneath his ear, there is a slight fissure on his right posterior earlobe.  No erythema over the adenitis.  No signs of suppurative thrombophlebitis Heart: Mildly  tachycardic, S3 heard, no systolic or diastolic murmur Lungs: Unlabored, clear throughout with no crackles Abd: Soft, nontender, no organomegaly Ext: Normal joints, no synovitis, no edema Neuro: Alert, conversational, full strength upper and lower extremities, normal gait      Assessment & Plan:   Problem List Items Addressed This Visit       Unprioritized   Bartonella infection - Primary   Acute unresolving issue over the last 2 weeks.  I think he has complicated Bartonella infection due to prolonged febrile illness, systemic symptoms, and immunosuppression on Humira.  Bartonella infection seems like the most likely culprit of these fevers given the exposure to her cats, high IgM titer, lymphadenitis in the cervical chain, and persistent fevers.  No other source of infection seen on my exam today.  I spoke with Dr. Thedore Mins with infectious disease.  We decided to discontinue azithromycin and start doxycycline 100 mg twice daily for 28 days with rifampin 300 mg for 14 days.  Will get echocardiogram due to risk of endocarditis.  Will check CBC and CMP today to look for any signs of organ dysfunction.  Advised them to go to the emergency department if he has worsening systemic symptoms over the weekend.  Otherwise follow-up with me in 3 days.      Relevant Medications   doxycycline (VIBRA-TABS) 100 MG tablet   rifampin (RIFADIN) 300  MG capsule   Other Relevant Orders   Comprehensive metabolic panel   CBC with Differential/Platelet   ECHOCARDIOGRAM COMPLETE    Meds ordered this encounter  Medications   doxycycline (VIBRA-TABS) 100 MG tablet    Sig: Take 1 tablet (100 mg total) by mouth 2 (two) times daily for 28 days.    Dispense:  56 tablet    Refill:  0   rifampin (RIFADIN) 300 MG capsule    Sig: Take 1 capsule (300 mg total) by mouth 2 (two) times daily for 14 days.    Dispense:  28 capsule    Refill:  0    Return in about 3 days (around 01/09/2024).  Billy Alias,  MD

## 2024-01-06 NOTE — Assessment & Plan Note (Signed)
 Acute unresolving issue over the last 2 weeks.  I think he has complicated Bartonella infection due to prolonged febrile illness, systemic symptoms, and immunosuppression on Humira.  Bartonella infection seems like the most likely culprit of these fevers given the exposure to her cats, high IgM titer, lymphadenitis in the cervical chain, and persistent fevers.  No other source of infection seen on my exam today.  I spoke with Dr. Thedore Mins with infectious disease.  We decided to discontinue azithromycin and start doxycycline 100 mg twice daily for 28 days with rifampin 300 mg for 14 days.  Will get echocardiogram due to risk of endocarditis.  Will check CBC and CMP today to look for any signs of organ dysfunction.  Advised them to go to the emergency department if he has worsening systemic symptoms over the weekend.  Otherwise follow-up with me in 3 days.

## 2024-01-06 NOTE — Telephone Encounter (Signed)
Dr Havery Moros-  Please advise.Marland KitchenMarland Kitchen

## 2024-01-06 NOTE — Telephone Encounter (Signed)
 Patient mother called and stated that her son has Cat scratch fever infection. Patient mother stated that he is having to take antibiotics for 2 months and was wondering if her son can still take his Humira along with the antibiotics. Patient mother is requesting a call back today if possible at [phone]. Please advise.

## 2024-01-06 NOTE — Telephone Encounter (Signed)
 In the setting of an active infection I would recommend he hold the dose of Humira.  However, I do not see any notes in the chart from our office.  Has this patient been seen by our office, does he belong to our clinic?

## 2024-01-07 LAB — CULTURE, BLOOD (ROUTINE X 2)
Culture: NO GROWTH
Culture: NO GROWTH
Special Requests: ADEQUATE

## 2024-01-09 ENCOUNTER — Ambulatory Visit: Admitting: Student in an Organized Health Care Education/Training Program

## 2024-01-09 ENCOUNTER — Telehealth: Payer: Self-pay | Admitting: *Deleted

## 2024-01-09 ENCOUNTER — Other Ambulatory Visit: Payer: Self-pay | Admitting: Student in an Organized Health Care Education/Training Program

## 2024-01-09 ENCOUNTER — Ambulatory Visit (INDEPENDENT_AMBULATORY_CARE_PROVIDER_SITE_OTHER)

## 2024-01-09 ENCOUNTER — Other Ambulatory Visit (INDEPENDENT_AMBULATORY_CARE_PROVIDER_SITE_OTHER)

## 2024-01-09 ENCOUNTER — Encounter: Payer: Self-pay | Admitting: Student in an Organized Health Care Education/Training Program

## 2024-01-09 VITALS — BP 104/70 | HR 68 | Temp 82.0°F | Wt 198.1 lb

## 2024-01-09 DIAGNOSIS — A449 Bartonellosis, unspecified: Secondary | ICD-10-CM

## 2024-01-09 LAB — CBC WITH DIFFERENTIAL/PLATELET
Basophils Absolute: 0.1 10*3/uL (ref 0.0–0.1)
Basophils Relative: 0.6 % (ref 0.0–3.0)
Eosinophils Absolute: 0.1 10*3/uL (ref 0.0–0.7)
Eosinophils Relative: 0.9 % (ref 0.0–5.0)
HCT: 36.8 % — ABNORMAL LOW (ref 39.0–52.0)
Hemoglobin: 12.2 g/dL — ABNORMAL LOW (ref 13.0–17.0)
Lymphocytes Relative: 64.7 % — ABNORMAL HIGH (ref 12.0–46.0)
Lymphs Abs: 6.4 10*3/uL — ABNORMAL HIGH (ref 0.7–4.0)
MCHC: 33.3 g/dL (ref 30.0–36.0)
MCV: 90.4 fl (ref 78.0–100.0)
Monocytes Absolute: 1.1 10*3/uL — ABNORMAL HIGH (ref 0.1–1.0)
Monocytes Relative: 10.7 % (ref 3.0–12.0)
Neutro Abs: 2.3 10*3/uL (ref 1.4–7.7)
Neutrophils Relative %: 23.1 % — ABNORMAL LOW (ref 43.0–77.0)
Platelets: 565 10*3/uL — ABNORMAL HIGH (ref 150.0–400.0)
RBC: 4.07 Mil/uL — ABNORMAL LOW (ref 4.22–5.81)
RDW: 12.8 % (ref 11.5–15.5)
WBC: 9.9 10*3/uL (ref 4.0–10.5)

## 2024-01-09 LAB — ECHOCARDIOGRAM COMPLETE
Area-P 1/2: 4.06 cm2
S' Lateral: 3.39 cm
Weight: 3170 [oz_av]

## 2024-01-09 LAB — COMPREHENSIVE METABOLIC PANEL
ALT: 57 U/L — ABNORMAL HIGH (ref 0–53)
AST: 20 U/L (ref 0–37)
Albumin: 4.2 g/dL (ref 3.5–5.2)
Alkaline Phosphatase: 89 U/L (ref 39–117)
BUN: 11 mg/dL (ref 6–23)
CO2: 24 meq/L (ref 19–32)
Calcium: 10 mg/dL (ref 8.4–10.5)
Chloride: 103 meq/L (ref 96–112)
Creatinine, Ser: 0.74 mg/dL (ref 0.40–1.50)
GFR: 129.36 mL/min (ref >60.00)
Glucose, Bld: 79 mg/dL (ref 70–99)
Potassium: 4 meq/L (ref 3.5–5.1)
Sodium: 137 meq/L (ref 135–145)
Total Bilirubin: 1 mg/dL (ref 0.2–1.2)
Total Protein: 9.6 g/dL — ABNORMAL HIGH (ref 6.0–8.3)

## 2024-01-09 NOTE — Telephone Encounter (Signed)
 Original notes were placed under duplicate chart, [id number] for patient. Medical records is advised.  Dr Erlinda Hong

## 2024-01-09 NOTE — Telephone Encounter (Signed)
 Got it, thanks Dottie.

## 2024-01-09 NOTE — Telephone Encounter (Signed)
 I have spoken to patient to advise that he hold Humira in the setting of cat scratch fever. Advised he should resume once he has finished his full course of antibiotics and feels better. He verbalizes understanding.

## 2024-01-09 NOTE — Progress Notes (Signed)
   Acute Office Visit  Subjective:     Patient ID: Billy Allen, male    DOB: 12-Jun-2001, 23 y.o.   MRN: [id number]  Chief Complaint  Patient presents with   Follow-up    Pt is here for F/U  Sx today Fatigue and 3 am 102.6  01/08/2024 fever 100.2 OTC Advil 200mg  1 am    HPI  Patient is in today for close follow-up of complicated Bartonella infection.  I saw the patient on Friday, we changed antibiotics to doxycycline and rifampin which she has started, and is tolerating them well.  No side effects.  He reports having some more fevers over the weekend, had a fever last night at 101.  Eating and drinking well.  No chest pain or shortness of breath.  No new rashes.  Having some floaters in his vision but no decreased vision, no field deficits.  No headaches or neck pain.  Overall he says he is feeling a little bit better compared to Friday, but everyone is eager for the fever to resolve.      Objective:    BP 104/70   Pulse 68   Temp (!) 82 F (27.8 C)   Wt 198 lb 2 oz (89.9 kg)   SpO2 100%   BMI 30.12 kg/m    Physical Exam  Gen: Tired appearing young man Eyes: Normal, bilateral retina appear normal with no spots Neck: About 3 cm mobile lymphadenitis on the right cervical chain Heart: Regular today, no murmurs or extra heart sounds Lungs: Unlabored, clear without crackles Ext: Warm, no edema      Assessment & Plan:   Problem List Items Addressed This Visit       Unprioritized   Bartonella infection - Primary   Acute issue for which we are doing close follow-up today.  Has a complicated Bartonella infection with lymphadenitis on his right cervical chain, seems like this is getting a little better.  Still having fevers, last to 101 last night.  We are continuing doxycycline and rifampin for treatment of complicated infection.  There is risk of dissemination, bacteremia, and possibly endocarditis.  Going to check labs today including a new set of blood cultures.  I have  marked this for special culture for Bartonella.  Plan for echocardiogram later today to rule out endocarditis.  Follow-up with me in 3-4 days.  I think there is a good chance that the fevers will resolve in the coming days now that he is on Doxy and rifampin.      Relevant Orders   Comprehensive metabolic panel   CBC with Differential/Platelet    Return in about 3 days (around 01/12/2024).  [name], MD

## 2024-01-09 NOTE — Telephone Encounter (Signed)
 Benancio Deeds, MD to Me     01/06/24  5:32 PM Note In the setting of an active infection I would recommend he hold the dose of Humira.   However, I do not see any notes in the chart from our office.  Has this patient been seen by our office, does he belong to our clinic?       01/06/24  3:26 PM You routed this conversation to Benancio Deeds, MD    01/06/24  3:26 PM Note Dr Adela Lank- Please advise...       01/06/24  3:23 PM Cortes, Rosezella [place] routed this conversation to [facility] Pod A Triage  Tylene Fantasia    01/06/24  3:23 PM Note Patient mother called and stated that her son has Cat scratch fever infection. Patient mother stated that he is having to take antibiotics for 2 months and was wondering if her son can still take his Humira along with the antibiotics. Patient mother is requesting a call back today if possible at [phone]. Please advise.         01/06/24  3:20 PM Peng,Hui (Mother) contacted Tylene Fantasia

## 2024-01-09 NOTE — Assessment & Plan Note (Signed)
 Acute issue for which we are doing close follow-up today.  Has a complicated Bartonella infection with lymphadenitis on his right cervical chain, seems like this is getting a little better.  Still having fevers, last to 101 last night.  We are continuing doxycycline and rifampin for treatment of complicated infection.  There is risk of dissemination, bacteremia, and possibly endocarditis.  Going to check labs today including a new set of blood cultures.  I have marked this for special culture for Bartonella.  Plan for echocardiogram later today to rule out endocarditis.  Follow-up with me in 3-4 days.  I think there is a good chance that the fevers will resolve in the coming days now that he is on Doxy and rifampin.

## 2024-01-10 ENCOUNTER — Encounter: Admitting: Family

## 2024-01-10 ENCOUNTER — Inpatient Hospital Stay

## 2024-01-10 ENCOUNTER — Ambulatory Visit: Admitting: Student in an Organized Health Care Education/Training Program

## 2024-01-11 ENCOUNTER — Ambulatory Visit: Payer: Commercial Managed Care - PPO | Admitting: Internal Medicine

## 2024-01-11 ENCOUNTER — Telehealth: Payer: Self-pay | Admitting: Internal Medicine

## 2024-01-11 NOTE — Telephone Encounter (Signed)
 Patient needing excuse notes for work 12/24/2023-01/29/2024. Patient does have an appointment tomorrow. Please advise

## 2024-01-11 NOTE — Telephone Encounter (Signed)
 Papers received from front and now with Dr.vincent to sign and go over.

## 2024-01-11 NOTE — Telephone Encounter (Signed)
 Sure. I can give him this note tomorrow in our visit.

## 2024-01-11 NOTE — Telephone Encounter (Signed)
 Copied from CRM [id number]. Topic: General - Other >> Jan 11, 2024  9:27 AM Sim Boast F wrote: Reason for CRM: patient mother requesting letter for patients job excusing him out of work 12/24/23-01/29/24. Patient has an appointment tomorrow 01/12/24  We also received FMLA paperwork for pt on 01/11/24

## 2024-01-11 NOTE — Telephone Encounter (Deleted)
 Copied from CRM [id number]. Topic: General - Other >> Jan 11, 2024  9:27 AM Sim Boast F wrote: Reason for CRM: patient mother requesting letter for patients job excusing him out of work 12/24/23-01/29/24. Patient has an appointment tomorrow 01/12/24

## 2024-01-11 NOTE — Telephone Encounter (Signed)
 Type of form received: FMLA Forms  Additional comments:   Received by: [employer] - Front Desk  Form should be Faxed/mailed to: (address/ fax #) Fax to [phone]  Is patient requesting call for pickup: N/A  Form placed:  Labeled & placed in provider bin  Attach charge sheet.  Provider will determine charge.  Individual made aware of 3-5 business day turn around? N/A

## 2024-01-11 NOTE — Telephone Encounter (Signed)
 Papers filled signed,  faxed, copied and scanned.

## 2024-01-12 ENCOUNTER — Other Ambulatory Visit: Payer: Self-pay

## 2024-01-12 ENCOUNTER — Ambulatory Visit: Admitting: Student in an Organized Health Care Education/Training Program

## 2024-01-12 ENCOUNTER — Other Ambulatory Visit (HOSPITAL_COMMUNITY): Payer: Self-pay

## 2024-01-12 ENCOUNTER — Encounter: Payer: Self-pay | Admitting: Student in an Organized Health Care Education/Training Program

## 2024-01-12 ENCOUNTER — Telehealth: Payer: Self-pay | Admitting: Student in an Organized Health Care Education/Training Program

## 2024-01-12 ENCOUNTER — Ambulatory Visit: Admitting: Infectious Disease

## 2024-01-12 VITALS — BP 122/78 | HR 103 | Temp 98.1°F | Wt 199.0 lb

## 2024-01-12 DIAGNOSIS — F419 Anxiety disorder, unspecified: Secondary | ICD-10-CM | POA: Diagnosis not present

## 2024-01-12 DIAGNOSIS — A449 Bartonellosis, unspecified: Secondary | ICD-10-CM

## 2024-01-12 HISTORY — DX: Anxiety disorder, unspecified: F41.9

## 2024-01-12 MED ORDER — ESCITALOPRAM OXALATE 5 MG PO TABS
5.0000 mg | ORAL_TABLET | Freq: Every day | ORAL | 1 refills | Status: AC
  Filled 2024-01-12: qty 90, 90d supply, fill #0
  Filled 2024-07-17: qty 90, 90d supply, fill #1

## 2024-01-12 NOTE — Assessment & Plan Note (Signed)
 Acute issue over the last 4 weeks.  Great to hear it is now improving.  No fevers for 2 days.  Energy levels improving.  Clinically making good improvements.  Plan will be to continue doxycycline for a total of 4 weeks and rifampin for total of 2 weeks.  He has a consultation planned with ID to help decide on the total antibiotic course.

## 2024-01-12 NOTE — Telephone Encounter (Signed)
 Brought papers from front desk, Dr.Vincent signed, Faxed at 3:32pm on 01/12/2024

## 2024-01-12 NOTE — Progress Notes (Signed)
   Acute Office Visit  Subjective:     Patient ID: Billy Allen, male    DOB: 25-Sep-2001, 23 y.o.   MRN: [id number]  Chief Complaint  Patient presents with   Fever    3 day follow up for fevers  Patient states things are going better and has not had a fever since 01/09/2024    HPI  Patient is in today for Bartonella infection.  He is feeling much better than the last 2 visits with me.  He reports that the fevers have resolved over the last 2 days.  Last fever was Sunday night.  Good adherence with antibiotics.  Not having side effects.  Still some vague vision symptoms, but sounds like floaters, no visual field defects.  Energy levels are improving.  Eating and drinking well.  Becoming more functional in the day.  Mood is doing well.      Objective:    BP 122/78   Pulse (!) 103   Temp 98.1 F (36.7 C) (Temporal)   Wt 199 lb (90.3 kg)   SpO2 98%   BMI 30.26 kg/m    Physical Exam  Gen: Well-appearing Neck: Right cervical lymph node feels smaller today, measures around 2.5 cm.  No overlying skin changes.  The scratch under his right earlobe is healing well. Heart: Regular, no murmurs Lungs: Unlabored, clear throughout Extremities: Warm, no lower extremity edema Psych: Appropriate mood and affect, not anxious or depressed appearing      Assessment & Plan:   Problem List Items Addressed This Visit       Unprioritized   Bartonella infection - Primary   Acute issue over the last 4 weeks.  Great to hear it is now improving.  No fevers for 2 days.  Energy levels improving.  Clinically making good improvements.  Plan will be to continue doxycycline for a total of 4 weeks and rifampin for total of 2 weeks.  He has a consultation planned with ID to help decide on the total antibiotic course.      Anxiety   Chronic and stable.  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  Lots of stress right now with the MCAT coming up in April.  Plan to continue Lexapro 5 mg daily which  the patient is responding well to.  I sent a 90-day refill.      Relevant Medications   escitalopram (LEXAPRO) 5 MG tablet    Meds ordered this encounter  Medications   escitalopram (LEXAPRO) 5 MG tablet    Sig: Take 1 tablet (5 mg total) by mouth at bedtime.    Dispense:  90 tablet    Refill:  1    Return in about 4 weeks (around 02/09/2024).  [name], MD

## 2024-01-12 NOTE — Telephone Encounter (Signed)
 Called patient to let him know there will be a copy of these papers sitting at the front desk for pick up. Patient verbalized understanding.

## 2024-01-12 NOTE — Telephone Encounter (Signed)
 Type of form received: Leave of Absence Forms  Additional comments:   Received by: [employer] - Front Desk  Form should be Faxed/mailed to: (address/ fax #) Fax to [phone]  Is patient requesting call for pickup: Yes, pt would like a copy of completed forms  Form placed:  Labeled & placed in provider bin  Attach charge sheet.  Provider will determine charge.  Individual made aware of 3-5 business day turn around? Yes

## 2024-01-12 NOTE — Assessment & Plan Note (Signed)
 Chronic and stable.  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  Lots of stress right now with the MCAT coming up in April.  Plan to continue Lexapro 5 mg daily which the patient is responding well to.  I sent a 90-day refill.

## 2024-01-13 ENCOUNTER — Other Ambulatory Visit (HOSPITAL_COMMUNITY): Payer: Self-pay

## 2024-01-15 LAB — CULTURE, BLOOD (SINGLE)
MICRO NUMBER:: 16209246
MICRO NUMBER:: 16209269
Result:: NO GROWTH
SPECIMEN QUALITY:: ADEQUATE

## 2024-01-16 ENCOUNTER — Telehealth: Payer: Self-pay | Admitting: Student in an Organized Health Care Education/Training Program

## 2024-01-16 NOTE — Telephone Encounter (Signed)
 Type of form received: FMLA Forms  Additional comments:   Received by: [employer] - Front Desk  Form should be Faxed/mailed to: (address/ fax #) Fax to [phone]  Is patient requesting call for pickup: N/A  Form placed:  Labeled & placed in provider bin  Attach charge sheet.  Provider will determine charge.  Individual made aware of 3-5 business day turn around?

## 2024-01-16 NOTE — Telephone Encounter (Signed)
 Placed in folder at nurse station

## 2024-01-16 NOTE — Telephone Encounter (Signed)
 Completed for continuous leave of abscence from 12/12/23 to 02/09/24 to recover from Bartonella infection.

## 2024-01-17 ENCOUNTER — Ambulatory Visit: Admitting: Internal Medicine

## 2024-01-26 ENCOUNTER — Ambulatory Visit: Admitting: Internal Medicine

## 2024-02-03 ENCOUNTER — Other Ambulatory Visit: Payer: Self-pay

## 2024-02-03 NOTE — Progress Notes (Signed)
 Specialty Pharmacy Refill Coordination Note  Billy Allen is a 23 y.o. male contacted today regarding refills of specialty medication(s) Adalimumab [name]   Patient requested Delivery   Delivery date: 02/16/24   Verified address: [address] [place], [place] [address]   Medication will be filled on 04.23.25.

## 2024-02-06 ENCOUNTER — Ambulatory Visit: Admitting: Internal Medicine

## 2024-02-13 ENCOUNTER — Ambulatory Visit: Admitting: Student in an Organized Health Care Education/Training Program

## 2024-02-13 ENCOUNTER — Ambulatory Visit: Admitting: Internal Medicine

## 2024-02-15 ENCOUNTER — Other Ambulatory Visit

## 2024-02-15 ENCOUNTER — Other Ambulatory Visit (HOSPITAL_COMMUNITY): Payer: Self-pay

## 2024-02-15 ENCOUNTER — Other Ambulatory Visit: Payer: Self-pay

## 2024-02-15 DIAGNOSIS — K5010 Crohn's disease of large intestine without complications: Secondary | ICD-10-CM

## 2024-02-16 DIAGNOSIS — J3089 Other allergic rhinitis: Secondary | ICD-10-CM | POA: Diagnosis not present

## 2024-02-16 DIAGNOSIS — J301 Allergic rhinitis due to pollen: Secondary | ICD-10-CM | POA: Diagnosis not present

## 2024-02-16 DIAGNOSIS — J3081 Allergic rhinitis due to animal (cat) (dog) hair and dander: Secondary | ICD-10-CM | POA: Diagnosis not present

## 2024-02-18 LAB — CALPROTECTIN, FECAL: Calprotectin, Fecal: 917 ug/g — ABNORMAL HIGH (ref 0–120)

## 2024-02-20 ENCOUNTER — Other Ambulatory Visit: Payer: Self-pay | Admitting: *Deleted

## 2024-02-20 DIAGNOSIS — Z79899 Other long term (current) drug therapy: Secondary | ICD-10-CM

## 2024-02-20 DIAGNOSIS — K5010 Crohn's disease of large intestine without complications: Secondary | ICD-10-CM

## 2024-03-05 ENCOUNTER — Other Ambulatory Visit: Payer: Self-pay

## 2024-03-05 DIAGNOSIS — J301 Allergic rhinitis due to pollen: Secondary | ICD-10-CM | POA: Diagnosis not present

## 2024-03-05 DIAGNOSIS — J3081 Allergic rhinitis due to animal (cat) (dog) hair and dander: Secondary | ICD-10-CM | POA: Diagnosis not present

## 2024-03-05 DIAGNOSIS — J3089 Other allergic rhinitis: Secondary | ICD-10-CM | POA: Diagnosis not present

## 2024-03-07 ENCOUNTER — Other Ambulatory Visit: Payer: Self-pay

## 2024-03-07 ENCOUNTER — Other Ambulatory Visit: Payer: Self-pay | Admitting: Pharmacy Technician

## 2024-03-07 NOTE — Progress Notes (Signed)
 Specialty Pharmacy Refill Coordination Note  Billy Allen is a 23 y.o. male contacted today regarding refills of specialty medication(s) Adalimumab  (HUMIRA )   Patient requested Delivery   Delivery date: 03/21/24   Verified address: [address]  [place]   Medication will be filled on 03/20/24.

## 2024-03-14 DIAGNOSIS — J3081 Allergic rhinitis due to animal (cat) (dog) hair and dander: Secondary | ICD-10-CM | POA: Diagnosis not present

## 2024-03-14 DIAGNOSIS — J3089 Other allergic rhinitis: Secondary | ICD-10-CM | POA: Diagnosis not present

## 2024-03-14 DIAGNOSIS — J301 Allergic rhinitis due to pollen: Secondary | ICD-10-CM | POA: Diagnosis not present

## 2024-03-21 DIAGNOSIS — J301 Allergic rhinitis due to pollen: Secondary | ICD-10-CM | POA: Diagnosis not present

## 2024-03-21 DIAGNOSIS — J3081 Allergic rhinitis due to animal (cat) (dog) hair and dander: Secondary | ICD-10-CM | POA: Diagnosis not present

## 2024-03-21 DIAGNOSIS — J3089 Other allergic rhinitis: Secondary | ICD-10-CM | POA: Diagnosis not present

## 2024-03-28 DIAGNOSIS — J3081 Allergic rhinitis due to animal (cat) (dog) hair and dander: Secondary | ICD-10-CM | POA: Diagnosis not present

## 2024-03-28 DIAGNOSIS — J301 Allergic rhinitis due to pollen: Secondary | ICD-10-CM | POA: Diagnosis not present

## 2024-03-28 DIAGNOSIS — J3089 Other allergic rhinitis: Secondary | ICD-10-CM | POA: Diagnosis not present

## 2024-04-05 DIAGNOSIS — J3081 Allergic rhinitis due to animal (cat) (dog) hair and dander: Secondary | ICD-10-CM | POA: Diagnosis not present

## 2024-04-05 DIAGNOSIS — J301 Allergic rhinitis due to pollen: Secondary | ICD-10-CM | POA: Diagnosis not present

## 2024-04-05 DIAGNOSIS — J3089 Other allergic rhinitis: Secondary | ICD-10-CM | POA: Diagnosis not present

## 2024-04-10 ENCOUNTER — Ambulatory Visit: Admitting: Gastroenterology

## 2024-04-11 DIAGNOSIS — J3081 Allergic rhinitis due to animal (cat) (dog) hair and dander: Secondary | ICD-10-CM | POA: Diagnosis not present

## 2024-04-11 DIAGNOSIS — J3089 Other allergic rhinitis: Secondary | ICD-10-CM | POA: Diagnosis not present

## 2024-04-11 DIAGNOSIS — J301 Allergic rhinitis due to pollen: Secondary | ICD-10-CM | POA: Diagnosis not present

## 2024-04-18 ENCOUNTER — Other Ambulatory Visit: Payer: Self-pay

## 2024-04-18 ENCOUNTER — Other Ambulatory Visit (HOSPITAL_COMMUNITY): Payer: Self-pay

## 2024-04-18 ENCOUNTER — Other Ambulatory Visit: Payer: Self-pay | Admitting: Pharmacist

## 2024-04-18 ENCOUNTER — Other Ambulatory Visit: Payer: Self-pay | Admitting: Gastroenterology

## 2024-04-18 DIAGNOSIS — K5010 Crohn's disease of large intestine without complications: Secondary | ICD-10-CM

## 2024-04-18 MED ORDER — ADALIMUMAB 40 MG/0.8ML ~~LOC~~ AJKT
40.0000 mg | AUTO-INJECTOR | SUBCUTANEOUS | 0 refills | Status: DC
  Filled 2024-04-18: qty 4, 28d supply, fill #0
  Filled 2024-05-21 – 2024-05-28 (×4): qty 4, 28d supply, fill #1
  Filled 2024-06-19: qty 4, 28d supply, fill #2

## 2024-04-18 MED ORDER — ADALIMUMAB 40 MG/0.8ML ~~LOC~~ AJKT
40.0000 mg | AUTO-INJECTOR | SUBCUTANEOUS | 0 refills | Status: DC
  Filled 2024-04-18: qty 12, 84d supply, fill #0

## 2024-04-18 NOTE — Progress Notes (Signed)
 Specialty Pharmacy Refill Coordination Note  Billy Allen is a 23 y.o. male contacted today regarding refills of specialty medication(s) Adalimumab  (HUMIRA )   Patient requested Delivery   Delivery date: 04/25/24   Verified address: [address]  [place]   Medication will be filled on 04/24/24, pending refill approval.   Refill request sent to original provider. Send to [place] for rewrite when approved.

## 2024-04-20 DIAGNOSIS — J3081 Allergic rhinitis due to animal (cat) (dog) hair and dander: Secondary | ICD-10-CM | POA: Diagnosis not present

## 2024-04-20 DIAGNOSIS — J301 Allergic rhinitis due to pollen: Secondary | ICD-10-CM | POA: Diagnosis not present

## 2024-04-20 DIAGNOSIS — J3089 Other allergic rhinitis: Secondary | ICD-10-CM | POA: Diagnosis not present

## 2024-04-24 ENCOUNTER — Other Ambulatory Visit: Payer: Self-pay

## 2024-04-26 DIAGNOSIS — J301 Allergic rhinitis due to pollen: Secondary | ICD-10-CM | POA: Diagnosis not present

## 2024-04-26 DIAGNOSIS — J3081 Allergic rhinitis due to animal (cat) (dog) hair and dander: Secondary | ICD-10-CM | POA: Diagnosis not present

## 2024-04-26 DIAGNOSIS — J3089 Other allergic rhinitis: Secondary | ICD-10-CM | POA: Diagnosis not present

## 2024-05-03 DIAGNOSIS — J301 Allergic rhinitis due to pollen: Secondary | ICD-10-CM | POA: Diagnosis not present

## 2024-05-03 DIAGNOSIS — J3081 Allergic rhinitis due to animal (cat) (dog) hair and dander: Secondary | ICD-10-CM | POA: Diagnosis not present

## 2024-05-03 DIAGNOSIS — J3089 Other allergic rhinitis: Secondary | ICD-10-CM | POA: Diagnosis not present

## 2024-05-07 ENCOUNTER — Other Ambulatory Visit: Payer: Self-pay

## 2024-05-09 ENCOUNTER — Other Ambulatory Visit

## 2024-05-09 DIAGNOSIS — Z79899 Other long term (current) drug therapy: Secondary | ICD-10-CM | POA: Diagnosis not present

## 2024-05-09 DIAGNOSIS — K5010 Crohn's disease of large intestine without complications: Secondary | ICD-10-CM

## 2024-05-10 DIAGNOSIS — J301 Allergic rhinitis due to pollen: Secondary | ICD-10-CM | POA: Diagnosis not present

## 2024-05-10 DIAGNOSIS — J3089 Other allergic rhinitis: Secondary | ICD-10-CM | POA: Diagnosis not present

## 2024-05-10 DIAGNOSIS — J3081 Allergic rhinitis due to animal (cat) (dog) hair and dander: Secondary | ICD-10-CM | POA: Diagnosis not present

## 2024-05-15 ENCOUNTER — Ambulatory Visit: Admitting: Gastroenterology

## 2024-05-15 ENCOUNTER — Encounter: Payer: Self-pay | Admitting: Gastroenterology

## 2024-05-15 VITALS — BP 118/70 | HR 72 | Ht 68.25 in | Wt 207.0 lb

## 2024-05-15 DIAGNOSIS — Z79899 Other long term (current) drug therapy: Secondary | ICD-10-CM | POA: Diagnosis not present

## 2024-05-15 DIAGNOSIS — K5010 Crohn's disease of large intestine without complications: Secondary | ICD-10-CM | POA: Diagnosis not present

## 2024-05-15 DIAGNOSIS — Z5181 Encounter for therapeutic drug level monitoring: Secondary | ICD-10-CM

## 2024-05-15 LAB — CALPROTECTIN: Calprotectin: 1020 ug/g — ABNORMAL HIGH

## 2024-05-15 NOTE — Patient Instructions (Signed)
 Will follow up with your results once they are available.   _______________________________________________________  If your blood pressure at your visit was 140/90 or greater, please contact your primary care physician to follow up on this.  _______________________________________________________  If you are age 22 or older, your body mass index should be between 23-30. Your Body mass index is 31.24 kg/m. If this is out of the aforementioned range listed, please consider follow up with your Primary Care Provider.  If you are age 14 or younger, your body mass index should be between 19-25. Your Body mass index is 31.24 kg/m. If this is out of the aformentioned range listed, please consider follow up with your Primary Care Provider.   ________________________________________________________  The [facility] providers would like to encourage you to use MYCHART to communicate with providers for non-urgent requests or questions.  Due to long hold times on the telephone, sending your provider a message by [facility] may be a faster and more efficient way to get a response.  Please allow 48 business hours for a response.  Please remember that this is for non-urgent requests.  _______________________________________________________

## 2024-05-15 NOTE — Progress Notes (Signed)
 05/15/2024 Billy Allen [id number] 02-14-2001   HISTORY OF PRESENT ILLNESS:  This is a 23 year old male who is a patient Dr. Hassan.  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   Crohn's disease history: Dx of Crohn's colitis when he presented in April 2021 with severe diarrhea.  Previously followed by Dr. Leatrice at [facility].  Initially treated with methotrexate  and folic acid, colonoscopy June 2022 showed active disease.  He was started on Humira  at that time.    Colonoscopy June 2023 showed some mildly active colitis and his Humira  levels with subtherapeutic with no antibodies so they increased his Humira  to once weekly dosing.   Visit here was in May 2024 and fecal calprotectin at that time was normal.  He was feeling well.   He has since developed a perianal abscess that was treated by surgery back in October.  Fecal calprotectin in November was elevated 159.   Visit 11/2023:  Since resolution of that perianal abscess he has had no issues.  He denies any gut complaints.  Says that he is moving his bowels regularly about once a day.  No rectal bleeding.  No abdominal pain.  He says pretty much eats what he wants.  He did have his flu shot this year.  He had a low-grade fever between 100.4 and 101 degrees for a few days last week, the last that occurred was a couple of days ago.  No other upper respiratory symptoms.  CBC at an urgent care was normal.  He is due for his Humira  injection today.  No other joint/skin/eye complaints.  Humira  Ab and levels good in February 2025.  Fecal calprotectin elevated at 917 in April 2025 but had been off of Humira  for 6 weeks because of having fever and Bartonella infection that required several weeks of antibiotics.  Says that he was not having any Crohn's symptoms even when off the Humira .  Just submitted another fecal calprotectin last week and that is still pending.  Says that he feels fine without any GI complaints.   Has 1-2 formed bowel movements daily.  Does not see any blood.  No abdominal pain.  No further perianal issues.  No rashes.  No eye issues.  Is here with his mother today.     IBD Health Care Maintenance: Annual Flu Vaccine - Date: 2024 UTD Pneumococcal Vaccine if receiving immunosuppression: - Date: PSV13 2023 TB testing if on anti-TNF, yearly - Date 01/2022 - DUE Vitamin D  screening - Date; 02/2022 Last Colonoscopy - Date: 03/2022   Endoscopic history:  EGD 03/2021: - Normal esophagus.  - Normal stomach. Biopsied.  - Normal examined duodenum. Biopsied.   - Normal esophagus. Biopsied.    Colonoscopy 03/2021: The perianal and digital rectal examinations were normal.  The terminal ileum appeared normal. Estimated blood loss was minimal.  Biopsies were taken with a cold forceps for histology.  Inflammation characterized by multiple shallow ulcerations was found as patches in the transverse colon and in the ascending colon. The anus, the rectum, the recto-sigmoid colon, the sigmoid colon, the descending colon, the splenic flexure and the distal transverse colon were spared. The inflammation was moderate in severity. Biopsies were taken with a cold forceps for histology. Estimated blood loss was minimal.    Diagnosis     A: Stomach, biopsy - Gastric mucosa with no diagnostic alterations including no Helicobacter pylori organisms seen   B: Small bowel, duodenum, biopsy - Duodenal mucosa with no  diagnostic alterations including no evidence of celiac disease   C: Esophagus, biopsy - Fragments of esophageal squamous epithelium with no diagnostic alterations including no increased eosinophil infiltrate   D: Colon, right, biopsy - Chronic active colitis with mild to moderate activity, consistent with the patient's clinical history of Crohn's disease - No evidence of dysplasia   E: Colon, transverse, biopsy - Chronic active colitis with moderate activity and granulomatous formation, consistent  with the patient's clinical history of Crohn's disease - No evidence of dysplasia   F: Colon, left, biopsy - Chronic colitis with focal mild activity and granulomatous formation, consistent with the patient's clinical history of Crohn's disease - No evidence of dysplasia   G: Small bowel, terminal ileum, biopsy - Small bowel mucosa with prominent lymphoid aggregates, consistent with ileal mucosa with no diagnostic alterations     EGD 01/2020:    - Localized nodularity of the distal esophagus,        otherwise normal esophagus. Biopsied.      - Nodular mucosa in the gastric antrum. Biopsied.      - Normal examined duodenum. Biopsied.    Colonoscopy 01/2020: - Congested, erythematous areas of colonic mucosa with apthae. Biopsied.  - The examined portion of the ileum was normal.  Biopsied.    A: Stomach, biopsy - Chronic superficial active gastritis with rare intramucosal granulomas compatible with involvement by patient's known history of inflammatory bowel disease - Reactive foveolar hyperplasia - An immunohistochemical stain for H. Pylori has been ordered and the results will be issued in an addendum - No evidence of malignancy, dysplasia, or metaplasia   B: Esophagus, biopsy - Superficial strips of benign squamous mucosa - No evidence of dysplasia or metaplasia   C: Small bowel, duodenum, biopsy - Acute and chronic duodenitis compatible with involvement by patient's known history of inflammatory bowel disease - No evidence of malignancy or dysplasia   D: Colon, right, biopsy - Focally active chronic colitis consistent with involvement by patient's known history of inflammatory bowel disease - No evidence of malignancy, dysplasia, granulomas, or viral cytopathic effect   E: Colon, transverse, biopsy - Focally active chronic colitis consistent with involvement by patient's known history of inflammatory bowel disease - No evidence of malignancy, dysplasia, granulomas, or viral  cytopathic effect   F: Colon, left, biopsy - Features of chronic mucosal injury - No evidence of active colitis in this specimen - No evidence of malignancy, dysplasia, granulomas, or viral cytopathic effect   G: Small bowel, terminal ileum, biopsy - Focally active chronic ileitis compatible with involvement by patient's known history of inflammatory bowel disease - No evidence of malignancy, dysplasia, granulomas, or viral cytopathic effect     Colonoscopy 04/09/22: The perianal and digital rectal examinations were normal. - The terminal ileum appeared normal. - Patchy very faint inflammatory characterized by granularity and loss of vascularity was found in the sigmoid colon, in the transverse colon and in the ascending colon. No ulcerations or erosions noted. - Internal hemorrhoids were found during retroflexion. The hemorrhoids were small. - The exam was otherwise without abnormality. - Biopsies were taken with a cold forceps for histology from right, transverse, and left colon.     1. Surgical [P], right colon biopsies MILD TO MODERATE ACTIVE COLITIS WITH ARCHITECTURAL DISARRAY. LYMPHOPLASMACYTIC INFILTRATE AND LYMPHOID AGGREGATES IN THE LAMINA PROPRIA CONSISTENT WITH CLINICAL HISTORY OF CROHN'S DISEASE. NO DYSPLASIA, GRANULOMATOUS INFLAMMATION OR MALIGNANCY IS SEEN. 2. Surgical [P], transverse colon biopsies MILD ACTIVE COLITIS WITH ARCHITECTURAL DISARRAY. LYMPHOPLASMACYTIC  INFILTRATE WITH LYMPHOID AGGREGATES ARE CONSISTENT WITH CLINICAL HISTORY OF CROHN'S DISEASE. NO DYSPLASIA, GRANULOMATOUS INFLAMMATION OR MALIGNANCY IS SEEN. 3. Surgical [P], left colon and rectal biopsies MILD ACTIVE COLITIS WITH ARCHITECTURAL DISARRAY. PROMINENT LYMPHOPLASMACYTIC INFILTRATE AND LYMPHOID AGGREGATES ARE SEEN CONSISTENT WITH CLINICAL HISTORY OF CROHN'S DISEASE. NO DYSPLASIA, GRANULOMATOUS INFLAMMATION OR MALIGNANCY IS SEEN. COMMENT: IMMUNOHISTOCHEMISTRY WITH APPROPRIATE CONTROLS FOR CD3  AND CD20 WAS PERFORMED ON ALL 3 BIOPSIES AND CONFIRMS THE POLYMORPHOUS NATURE OF THE LYMPHOID AGGREGATES. IMMUNOHISTOCHEMISTRY FOR CMV IS BEING ORDERED AND WILL BE REPORTED IN AN ADDENDUM. CLINICAL AND RADIOLOGICAL CORRELATION IS REQUIRED. THE CASE WAS DISCUSSED IN INTRADEPARTMENTAL CONSULTATION WITH DR. MARK LEGOLVAN AND DR. SMIR WHO CONCUR WITH THE FINDINGS.   Fecal calpro 07/2022 - 39 and November 2024 was 159.    Humira  Ab and levels good in February 2025.  Fecal calprotectin elevated at 917 in April 2025 but had been off of Humira  for 6 weeks because of having fever and Bartonella infection that required several weeks of antibiotics.     Past Medical History:  Diagnosis Date   Allergy    Anxiety 01/12/2024   Cat scratch fever    12/2023 (cervical LAD, fever, malaise x 1 wk, very high titer bartonella IgM   Crohn's disease (HCC)    Depression    Eczema    Uveitis    Past Surgical History:  Procedure Laterality Date   TURBINATE REDUCTION Bilateral 03/10/2021   Procedure: BILATERAL TURBINATE REDUCTION;  Surgeon: Karis Clunes, MD;  Location: [facility];  Service: ENT;  Laterality: Bilateral;    reports that he has never smoked. He has never used smokeless tobacco. He reports that he does not drink alcohol and does not use drugs. family history includes Diabetes in his maternal uncle and mother; Glaucoma in his maternal grandfather; Hyperlipidemia in his maternal grandfather; Hypertension in his father, maternal grandfather, maternal grandmother, and mother; Prostate cancer in his maternal grandfather. No Known Allergies    Outpatient Encounter Medications as of 05/15/2024  Medication Sig   acetaminophen  (TYLENOL ) 325 MG tablet Take 650 mg by mouth every 6 (six) hours as needed.   adalimumab  (HUMIRA ) 40 MG/0.8ML AJKT pen Inject 0.8 mLs (40 mg total) into the skin once a week.   Crisaborole  (EUCRISA ) 2 % OINT Apply twice daily to affected areas of skin. Once control  noted, switch to once daily   EPINEPHrine  (EPIPEN  2-PAK) 0.3 mg/0.3 mL IJ SOAJ injection Inject one epipen  into the thigh muscle as needed for systemic reaction. May repeat dose once if needed   escitalopram  (LEXAPRO ) 5 MG tablet Take 1 tablet (5 mg total) by mouth at bedtime.   finasteride  (PROPECIA ) 1 MG tablet Take 1 tablet (1 mg total) by mouth daily.   fluticasone  (FLONASE ) 50 MCG/ACT nasal spray Use 2 sprays into both nostrils daily   loratadine (CLARITIN REDITABS) 10 MG dissolvable tablet Take by mouth.   loratadine (CLARITIN) 10 MG tablet Take 10 mg by mouth daily.   tretinoin  (RETIN-A ) 0.05 % cream 1 application on the skin NIGHTLY Use a few nights a week and increase to nightly as tolerated.   triamcinolone  cream (KENALOG ) 0.1 % Apply 1 small Application topically to affected area 2 (two) times daily for eczema.   [DISCONTINUED] amitriptyline (ELAVIL) 25 MG tablet Take 1 tablet (25 mg total) by mouth at bedtime as needed for sleep.   [DISCONTINUED] Fluocinolone  Acetonide Body 0.01 % OIL Apply 1 small Application topically to affected area of scalp at bedtime  for 2 weeks.   [DISCONTINUED] metoprolol  succinate (TOPROL -XL) 25 MG 24 hr tablet Take 1 tablet (25 mg total) by mouth daily.   [DISCONTINUED] metroNIDAZOLE  (METROGEL ) 0.75 % gel Apply 1 small Application topically to face 2 (two) times daily.   [DISCONTINUED] montelukast (SINGULAIR) 10 MG tablet Take 10 mg by mouth at bedtime.   [DISCONTINUED] mupirocin  ointment (BACTROBAN ) 2 % Apply to affected area three times daily for 7 days.   No facility-administered encounter medications on file as of 05/15/2024.     REVIEW OF SYSTEMS  : All other systems reviewed and negative except where noted in the History of Present Illness.   PHYSICAL EXAM: BP 118/70 (BP Location: Left Arm, Patient Position: Sitting, Cuff Size: Large)   Pulse 72   Ht 5' 8.25 (1.734 m) Comment: height measured without shoes  Wt 207 lb (93.9 kg)   BMI 31.24 kg/m   General: Well developed Asian male in no acute distress Head: Normocephalic and atraumatic Eyes:  Sclerae anicteric, conjunctiva pink. Ears: Normal auditory acuity Lungs: Clear throughout to auscultation; no W/R/R. Heart: Regular rate and rhythm; no M/R/G. Abdomen: Soft, non-distended.  BS present.  Non-tender. Musculoskeletal: Symmetrical with no gross deformities  Skin: No lesions on visible extremities Extremities: No edema  Neurological: Alert oriented x 4, grossly non-focal Psychological:  Alert and cooperative. Normal mood and affect  ASSESSMENT AND PLAN: 23 y.o. male here for assessment of the following   1. Crohn's disease of large intestine without complication (HCC)   2. High risk medication use     As above, had subtherapeutic Humira  levels with active colitis in 2023.  Humira  was adjusted to once weekly dosing.  He has been on methotrexate  in the past, they had discussed thiopurines but held off.  He clinically is doing really well.  He has no active Crohn's symptoms, no uveitis, but did have a perianal abscess in October 2024.SABRA  He is tolerating Humira  well.  Fecal calprotectin elevated in April but he had been off of his Humira  for 6 weeks when he had Bartonella.  Submitted another fecal calprotectin last week, still pending.    -Follow-up results of fecal calprotectin and further recs per Dr. Leigh pending those results.   CC:  Jerrell Cleatus Ned,*

## 2024-05-15 NOTE — Progress Notes (Signed)
 Agree with assessment and plan as outlined. If he feels well and fecal calprotectin has normalized then continue current dosing of Humira . If symptoms or fecal calprotectin is elevated we will need to consider changing his regimen. Harlene please let me know when fecal calprotectin returns.

## 2024-05-16 ENCOUNTER — Ambulatory Visit: Payer: Self-pay | Admitting: Gastroenterology

## 2024-05-16 ENCOUNTER — Other Ambulatory Visit: Payer: Self-pay

## 2024-05-16 ENCOUNTER — Encounter: Payer: Self-pay | Admitting: Gastroenterology

## 2024-05-16 DIAGNOSIS — K5010 Crohn's disease of large intestine without complications: Secondary | ICD-10-CM

## 2024-05-17 ENCOUNTER — Other Ambulatory Visit: Payer: Self-pay

## 2024-05-17 ENCOUNTER — Other Ambulatory Visit (HOSPITAL_COMMUNITY): Payer: Self-pay

## 2024-05-17 DIAGNOSIS — J3089 Other allergic rhinitis: Secondary | ICD-10-CM | POA: Diagnosis not present

## 2024-05-17 DIAGNOSIS — J3081 Allergic rhinitis due to animal (cat) (dog) hair and dander: Secondary | ICD-10-CM | POA: Diagnosis not present

## 2024-05-17 DIAGNOSIS — J301 Allergic rhinitis due to pollen: Secondary | ICD-10-CM | POA: Diagnosis not present

## 2024-05-17 MED ORDER — NA SULFATE-K SULFATE-MG SULF 17.5-3.13-1.6 GM/177ML PO SOLN
ORAL | 0 refills | Status: DC
  Filled 2024-05-17: qty 354, 1d supply, fill #0

## 2024-05-21 ENCOUNTER — Other Ambulatory Visit (HOSPITAL_COMMUNITY): Payer: Self-pay

## 2024-05-21 ENCOUNTER — Other Ambulatory Visit: Payer: Self-pay

## 2024-05-21 NOTE — Progress Notes (Signed)
 Benefits Investigation Started  Rejection: Prior Authorization Required  Routed to: Tiffany P.

## 2024-05-21 NOTE — Progress Notes (Signed)
 [facility] patient. Routing to [employer]

## 2024-05-22 ENCOUNTER — Ambulatory Visit (AMBULATORY_SURGERY_CENTER): Admitting: Gastroenterology

## 2024-05-22 ENCOUNTER — Telehealth: Payer: Self-pay

## 2024-05-22 ENCOUNTER — Other Ambulatory Visit (HOSPITAL_COMMUNITY): Payer: Self-pay

## 2024-05-22 ENCOUNTER — Encounter: Payer: Self-pay | Admitting: Gastroenterology

## 2024-05-22 VITALS — BP 105/56 | HR 61 | Temp 98.4°F | Resp 11 | Ht 68.0 in | Wt 207.0 lb

## 2024-05-22 DIAGNOSIS — K5010 Crohn's disease of large intestine without complications: Secondary | ICD-10-CM | POA: Diagnosis not present

## 2024-05-22 DIAGNOSIS — K529 Noninfective gastroenteritis and colitis, unspecified: Secondary | ICD-10-CM | POA: Diagnosis not present

## 2024-05-22 DIAGNOSIS — K5150 Left sided colitis without complications: Secondary | ICD-10-CM | POA: Diagnosis not present

## 2024-05-22 MED ORDER — SODIUM CHLORIDE 0.9 % IV SOLN: 500.0000 mL | Freq: Once | INTRAVENOUS | Status: DC

## 2024-05-22 NOTE — Progress Notes (Signed)
 History and Physical Interval Note: see office note 05/15/24 for details. No interval changes. Crohn's colitis on Humira  with therapeutic levels, fecal calprotectin > 1000. Colonoscopy to assess disease activity, consideration for changing regimen. He otherwise feels pretty well without complaints.    05/22/2024 8:15 AM  Billy Allen  has presented today for endoscopic procedure(s), with the diagnosis of  Encounter Diagnosis  Name Primary?   Crohn's disease of large intestine without complication (HCC) Yes  .  The various methods of evaluation and treatment have been discussed with the patient and/or family. After consideration of risks, benefits and other options for treatment, the patient has consented to  the endoscopic procedure(s).   The patient's history has been reviewed, patient examined, no change in status, stable for surgery.  I have reviewed the patient's chart and labs.  Questions were answered to the patient's satisfaction.    Marcey Naval, MD [facility]

## 2024-05-22 NOTE — Telephone Encounter (Signed)
 Pharmacy Patient Advocate Encounter   Received notification from Call Center that prior authorization for Humira  (2 Pen) 40MG /0.8ML auto-injector kit is required/requested.   Insurance verification completed.   The patient is insured through [facility] .   Per test claim: PA required; PA submitted to above mentioned insurance via CoverMyMeds Key/confirmation #/EOC [id number] Status is pending

## 2024-05-22 NOTE — Op Note (Signed)
 [facility] Patient Name: Billy Allen Procedure Date: 05/22/2024 8:17 AM MRN: [id number] Endoscopist: Elspeth P. Leigh , MD, [id number] Age: 23 Referring MD:  Date of Birth: 2000-12-02 Gender: Male Account #: [id number] Procedure:                Colonoscopy Indications:              Disease activity assessment of Crohn's disease of                            the colon - on Humira  with therapeutic levels,                            fecal calprotectin has increased to over 1000                            recently Medicines:                Monitored Anesthesia Care Procedure:                Pre-Anesthesia Assessment:                           - Prior to the procedure, a History and Physical                            was performed, and patient medications and                            allergies were reviewed. The patient's tolerance of                            previous anesthesia was also reviewed. The risks                            and benefits of the procedure and the sedation                            options and risks were discussed with the patient.                            All questions were answered, and informed consent                            was obtained. Prior Anticoagulants: The patient has                            taken no anticoagulant or antiplatelet agents. ASA                            Grade Assessment: II - A patient with mild systemic                            disease. After reviewing the risks and benefits,  the patient was deemed in satisfactory condition to                            undergo the procedure.                           After obtaining informed consent, the colonoscope                            was passed under direct vision. Throughout the                            procedure, the patient's blood pressure, pulse, and                            oxygen saturations were monitored continuously. The                             CF [id number] #[id number] was introduced through the anus                            and advanced to the the terminal ileum, with                            identification of the appendiceal orifice and IC                            valve. The colonoscopy was performed without                            difficulty. The patient tolerated the procedure                            well. The quality of the bowel preparation was                            good. The terminal ileum, ileocecal valve,                            appendiceal orifice, and rectum were photographed. Scope In: 8:27:11 AM Scope Out: 8:43:48 AM Scope Withdrawal Time: 0 hours 13 minutes 13 seconds  Total Procedure Duration: 0 hours 16 minutes 37 seconds  Findings:                 The perianal and digital rectal examinations were                            normal.                           The terminal ileum appeared normal.                           The exam was otherwise without abnormality. No  active inflammation anywhere.                           Biopsies were taken with a cold forceps for                            histology for surveillance. Complications:            No immediate complications. Estimated blood loss:                            Minimal. Estimated Blood Loss:     Estimated blood loss was minimal. Impression:               - The examined portion of the ileum was normal.                           - The examination was otherwise normal. No active                            inflammation seen anywhere.                           - Biopsies were taken with a cold forceps for                            histology. Recommendation:           - Patient has a contact number available for                            emergencies. The signs and symptoms of potential                            delayed complications were discussed with the                            patient.  Return to normal activities tomorrow.                            Written discharge instructions were provided to the                            patient.                           - Resume previous diet.                           - Continue present medications.                           - Await pathology results.                           - Continue current regimen for now - endoscopically  colon is in remission. Consider MRE to evaluate the                            small bowel, will discuss with the patient [name]. Axel Frisk, MD 05/22/2024 8:50:20 AM This report has been signed electronically.

## 2024-05-22 NOTE — Progress Notes (Signed)
 Sedate, gd SR, tolerated procedure well, VSS, report to RN

## 2024-05-22 NOTE — Patient Instructions (Signed)
 Resume previous diet. Continue present medications. Awaiting pathology results.  Continue current regimen for now- endoscopically colon is in remission. Consider MRE to evaluate the small bowel, will discuss with the patient.   YOU HAD AN ENDOSCOPIC PROCEDURE TODAY AT THE [facility]:   Refer to the procedure report that was given to you for any specific questions about what was found during the examination.  If the procedure report does not answer your questions, please call your gastroenterologist to clarify.  If you requested that your care partner not be given the details of your procedure findings, then the procedure report has been included in a sealed envelope for you to review at your convenience later.  YOU SHOULD EXPECT: Some feelings of bloating in the abdomen. Passage of more gas than usual.  Walking can help get rid of the air that was put into your GI tract during the procedure and reduce the bloating. If you had a lower endoscopy (such as a colonoscopy or flexible sigmoidoscopy) you may notice spotting of blood in your stool or on the toilet paper. If you underwent a bowel prep for your procedure, you may not have a normal bowel movement for a few days.  Please Note:  You might notice some irritation and congestion in your nose or some drainage.  This is from the oxygen used during your procedure.  There is no need for concern and it should clear up in a day or so.  SYMPTOMS TO REPORT IMMEDIATELY:  Following lower endoscopy (colonoscopy or flexible sigmoidoscopy):  Excessive amounts of blood in the stool  Significant tenderness or worsening of abdominal pains  Swelling of the abdomen that is new, acute  Fever of 100F or higher  For urgent or emergent issues, a gastroenterologist can be reached at any hour by calling [phone]. Do not use MyChart messaging for urgent concerns.    DIET:  We do recommend a small meal at first, but then you may proceed to your  regular diet.  Drink plenty of fluids but you should avoid alcoholic beverages for 24 hours.  ACTIVITY:  You should plan to take it easy for the rest of today and you should NOT DRIVE or use heavy machinery until tomorrow (because of the sedation medicines used during the test).    FOLLOW UP: Our staff will call the number listed on your records the next business day following your procedure.  We will call around 7:15- 8:00 am to check on you and address any questions or concerns that you may have regarding the information given to you following your procedure. If we do not reach you, we will leave a message.     If any biopsies were taken you will be contacted by phone or by letter within the next 1-3 weeks.  Please call us  at [phone] if you have not heard about the biopsies in 3 weeks.    SIGNATURES/CONFIDENTIALITY: You and/or your care partner have signed paperwork which will be entered into your electronic medical record.  These signatures attest to the fact that that the information above on your After Visit Summary has been reviewed and is understood.  Full responsibility of the confidentiality of this discharge information lies with you and/or your care-partner.

## 2024-05-22 NOTE — Progress Notes (Signed)
 Pt's states no medical or surgical changes since previsit or office visit.

## 2024-05-22 NOTE — Progress Notes (Signed)
 Called to room to assist during endoscopic procedure.  Patient ID and intended procedure confirmed with present staff. Received instructions for my participation in the procedure from the performing physician.

## 2024-05-23 ENCOUNTER — Telehealth: Payer: Self-pay | Admitting: *Deleted

## 2024-05-23 ENCOUNTER — Other Ambulatory Visit (HOSPITAL_COMMUNITY): Payer: Self-pay

## 2024-05-23 NOTE — Telephone Encounter (Signed)
  Follow up Call-     05/22/2024    7:36 AM 04/09/2022    3:14 PM  Call back number  Post procedure Call Back phone  # [phone]  Permission to leave phone message Yes Yes     Patient questions:  Do you have a fever, pain , or abdominal swelling? No. Pain Score  0 *  Have you tolerated food without any problems? Yes  Have you been able to return to your normal activities? Yes  Do you have any questions about your discharge instructions: Diet   No. Medications  No. Follow up visit  No.  Do you have questions or concerns about your Care? No.  Actions: * If pain score is 4 or above: No action needed, pain <4.

## 2024-05-24 ENCOUNTER — Other Ambulatory Visit: Payer: Self-pay

## 2024-05-24 LAB — SURGICAL PATHOLOGY

## 2024-05-25 ENCOUNTER — Other Ambulatory Visit: Payer: Self-pay

## 2024-05-25 ENCOUNTER — Other Ambulatory Visit (HOSPITAL_COMMUNITY): Payer: Self-pay

## 2024-05-25 ENCOUNTER — Ambulatory Visit: Payer: Self-pay | Admitting: Gastroenterology

## 2024-05-25 DIAGNOSIS — J3089 Other allergic rhinitis: Secondary | ICD-10-CM | POA: Diagnosis not present

## 2024-05-25 DIAGNOSIS — K5010 Crohn's disease of large intestine without complications: Secondary | ICD-10-CM

## 2024-05-25 DIAGNOSIS — J3081 Allergic rhinitis due to animal (cat) (dog) hair and dander: Secondary | ICD-10-CM | POA: Diagnosis not present

## 2024-05-25 DIAGNOSIS — J301 Allergic rhinitis due to pollen: Secondary | ICD-10-CM | POA: Diagnosis not present

## 2024-05-25 NOTE — Telephone Encounter (Signed)
 Pharmacy Patient Advocate Encounter  Received notification from [facility] that Prior Authorization for Humira  (2 Pen) 40MG /0.8ML auto-injector kit has been APPROVED from 05-24-2024 to 05-22-2025   PA #/Case ID/Reference #: [id number]

## 2024-05-28 ENCOUNTER — Other Ambulatory Visit: Payer: Self-pay

## 2024-05-28 NOTE — Progress Notes (Signed)
 Specialty Pharmacy Refill Coordination Note  Billy Allen is a 23 y.o. male contacted today regarding refills of specialty medication(s) Adalimumab  (HUMIRA )   Patient requested Delivery   Delivery date: 05/29/24   Verified address: [address]  [place]   Medication will be filled on 05/28/24.

## 2024-05-30 ENCOUNTER — Ambulatory Visit (HOSPITAL_COMMUNITY)
Admission: RE | Admit: 2024-05-30 | Discharge: 2024-05-30 | Disposition: A | Discharge status: Home / Self Care | Source: Ambulatory Visit | Attending: Gastroenterology | Admitting: Gastroenterology

## 2024-05-30 DIAGNOSIS — K5010 Crohn's disease of large intestine without complications: Secondary | ICD-10-CM | POA: Diagnosis not present

## 2024-05-30 DIAGNOSIS — K5090 Crohn's disease, unspecified, without complications: Secondary | ICD-10-CM | POA: Diagnosis not present

## 2024-05-30 DIAGNOSIS — K769 Liver disease, unspecified: Secondary | ICD-10-CM | POA: Diagnosis not present

## 2024-05-30 MED ORDER — GADOBUTROL 1 MMOL/ML IV SOLN
9.0000 mL | Freq: Once | INTRAVENOUS | Status: AC | PRN
  Administered 2024-05-30: 9 mL via INTRAVENOUS

## 2024-06-04 ENCOUNTER — Ambulatory Visit: Payer: Self-pay | Admitting: Gastroenterology

## 2024-06-05 ENCOUNTER — Telehealth: Payer: Self-pay | Admitting: Pharmacist

## 2024-06-05 NOTE — Telephone Encounter (Signed)
 Called patient to schedule an appointment for the [facility] Specialty [facility]ication [facility]. I was unable to reach the patient so I left a HIPAA-compliant message requesting that the patient return my call.   Billy Allen, PharmD, JAQUELINE, CPP Clinical Pharmacist [facility] & [facility] [phone]

## 2024-06-08 DIAGNOSIS — J3081 Allergic rhinitis due to animal (cat) (dog) hair and dander: Secondary | ICD-10-CM | POA: Diagnosis not present

## 2024-06-08 DIAGNOSIS — J3089 Other allergic rhinitis: Secondary | ICD-10-CM | POA: Diagnosis not present

## 2024-06-08 DIAGNOSIS — J301 Allergic rhinitis due to pollen: Secondary | ICD-10-CM | POA: Diagnosis not present

## 2024-06-15 DIAGNOSIS — J3081 Allergic rhinitis due to animal (cat) (dog) hair and dander: Secondary | ICD-10-CM | POA: Diagnosis not present

## 2024-06-15 DIAGNOSIS — J301 Allergic rhinitis due to pollen: Secondary | ICD-10-CM | POA: Diagnosis not present

## 2024-06-15 DIAGNOSIS — J3089 Other allergic rhinitis: Secondary | ICD-10-CM | POA: Diagnosis not present

## 2024-06-19 ENCOUNTER — Other Ambulatory Visit: Payer: Self-pay

## 2024-06-19 NOTE — Progress Notes (Signed)
 Specialty Pharmacy Refill Coordination Note  Billy Allen is a 23 y.o. male contacted today regarding refills of specialty medication(s) Adalimumab  (HUMIRA )   Patient requested Delivery   Delivery date: 06/22/24   Verified address: [address]  [place]   Medication will be filled on 06/21/24.

## 2024-06-20 ENCOUNTER — Encounter: Payer: Self-pay | Admitting: Gastroenterology

## 2024-06-20 ENCOUNTER — Other Ambulatory Visit: Payer: Self-pay

## 2024-06-21 DIAGNOSIS — J3089 Other allergic rhinitis: Secondary | ICD-10-CM | POA: Diagnosis not present

## 2024-06-21 DIAGNOSIS — J3081 Allergic rhinitis due to animal (cat) (dog) hair and dander: Secondary | ICD-10-CM | POA: Diagnosis not present

## 2024-06-21 DIAGNOSIS — J301 Allergic rhinitis due to pollen: Secondary | ICD-10-CM | POA: Diagnosis not present

## 2024-06-29 DIAGNOSIS — J3089 Other allergic rhinitis: Secondary | ICD-10-CM | POA: Diagnosis not present

## 2024-06-29 DIAGNOSIS — J301 Allergic rhinitis due to pollen: Secondary | ICD-10-CM | POA: Diagnosis not present

## 2024-06-29 DIAGNOSIS — J3081 Allergic rhinitis due to animal (cat) (dog) hair and dander: Secondary | ICD-10-CM | POA: Diagnosis not present

## 2024-07-13 ENCOUNTER — Other Ambulatory Visit: Payer: Self-pay

## 2024-07-13 ENCOUNTER — Other Ambulatory Visit: Payer: Self-pay | Admitting: Pharmacy Technician

## 2024-07-13 ENCOUNTER — Other Ambulatory Visit: Payer: Self-pay | Admitting: Gastroenterology

## 2024-07-13 DIAGNOSIS — J3081 Allergic rhinitis due to animal (cat) (dog) hair and dander: Secondary | ICD-10-CM | POA: Diagnosis not present

## 2024-07-13 DIAGNOSIS — J3089 Other allergic rhinitis: Secondary | ICD-10-CM | POA: Diagnosis not present

## 2024-07-13 DIAGNOSIS — J301 Allergic rhinitis due to pollen: Secondary | ICD-10-CM | POA: Diagnosis not present

## 2024-07-13 DIAGNOSIS — K5010 Crohn's disease of large intestine without complications: Secondary | ICD-10-CM

## 2024-07-13 MED ORDER — ADALIMUMAB 40 MG/0.8ML ~~LOC~~ AJKT
40.0000 mg | AUTO-INJECTOR | SUBCUTANEOUS | 0 refills | Status: DC
  Filled 2024-07-13 – 2024-07-19 (×2): qty 4, 28d supply, fill #0

## 2024-07-13 NOTE — Progress Notes (Signed)
 Specialty Pharmacy Refill Coordination Note  Billy Allen is a 23 y.o. male contacted today regarding refills of specialty medication(s) Adalimumab  (HUMIRA )   Patient requested (Patient-Rptd) Delivery   Delivery date: 07/18/24   Verified address: [address]  [place] [address]   Medication will be filled on 07/17/24. Injection dates: 1 inj for 9/23 and 9/30, 10/7 Answered questionnaire.  This fill date is pending response to refill request from provider. Sent Patient mychart message and if they have not received fill by intended date they must follow up with pharmacy.

## 2024-07-16 ENCOUNTER — Other Ambulatory Visit: Payer: Self-pay

## 2024-07-17 ENCOUNTER — Ambulatory Visit: Attending: Student in an Organized Health Care Education/Training Program | Admitting: Pharmacist

## 2024-07-17 ENCOUNTER — Other Ambulatory Visit: Payer: Self-pay

## 2024-07-17 ENCOUNTER — Other Ambulatory Visit (HOSPITAL_COMMUNITY): Payer: Self-pay

## 2024-07-17 ENCOUNTER — Encounter: Payer: Self-pay | Admitting: Pharmacist

## 2024-07-17 DIAGNOSIS — Z7189 Other specified counseling: Secondary | ICD-10-CM

## 2024-07-17 NOTE — Progress Notes (Signed)
 Called & Spoke with patient. Needs to speak with Herlene before he can send in the re-write. Patient aware & provide patient with Herlene number [phone].

## 2024-07-17 NOTE — Progress Notes (Signed)
  S: Patient presents for review of their specialty medication therapy.  Patient is about to start Humira  for Crohn's disease. Patient is managed by Dr. Leigh for this.   Adherence: confirmed  Efficacy: better symptom control with 40 mg once weekly.   Dosing:  Crohn disease: SubQ (may continue aminosalicylates and/or corticosteroids; if necessary, azathioprine, mercaptopurine, or methotrexate  may also be continued): Maintenance: 40 mg every other week beginning day 29. Note: Some patients may require 40 mg every week as maintenance therapy Landrum 2009).  Dose adjustments: Renal: no dose adjustments (has not been studied) Hepatic: no dose adjustments (has not been studied)  Drug-drug interactions:none identified   Screening: TB test: negative Hepatitis:  - Hep B: Heb b Ab pos; surface Ag, core ag negative  Monitoring: S/sx of infection: none CBC: stable from 01/09/2024 S/sx of hypersensitivity: none S/sx of malignancy: none S/sx of heart failure: none   Other side effects: none  O: Lab Results  Component Value Date   WBC 9.9 01/09/2024   HGB 12.2 (L) 01/09/2024   HCT 36.8 (L) 01/09/2024   MCV 90.4 01/09/2024   PLT 565.0 (H) 01/09/2024      Chemistry      Component Value Date/Time   NA 137 01/09/2024 0849   K 4.0 01/09/2024 0849   CL 103 01/09/2024 0849   CO2 24 01/09/2024 0849   BUN 11 01/09/2024 0849   CREATININE 0.74 01/09/2024 0849   CREATININE 0.75 09/10/2021 1448      Component Value Date/Time   CALCIUM 10.0 01/09/2024 0849   ALKPHOS 89 01/09/2024 0849   AST 20 01/09/2024 0849   ALT 57 (H) 01/09/2024 0849   BILITOT 1.0 01/09/2024 0849       A/P: 1. Medication review: Patient currently taking Humira  for Crohn's disease. Reviewed the medication with the patient, including the following: Humira  is a TNF blocking agent indicated for ankylosing spondylitis, Crohn's disease, Hidradenitis suppurativa, psoriatic arthritis, plaque psoriasis,  ulcerative colitis, and uveitis. Patient educated on purpose, proper use and potential adverse effects of Humira . Possible adverse effects are increased risk of infections, headache, and injection site reactions. There is the possibility of an increased risk of malignancy but it is not well understood if this increased risk is due to there medication or the disease state. There are rare cases of pancytopenia and aplastic anemia. For SubQ injection at separate sites in the thigh or lower abdomen (avoiding areas within 2 inches of navel); rotate injection sites. May leave at room temperature for ~15 to 30 minutes prior to use; do not remove cap or cover while allowing product to reach room temperature. Do not use if solution is discolored or contains particulate matter. Do not administer to skin which is red, tender, bruised, hard, or that has scars, stretch marks, or psoriasis plaques. Needle cap of the prefilled syringe or needle cover for the adalimumab  pen may contain latex. Prefilled pens and syringes are available for use by patients and the full amount of the syringe should be injected (self-administration); the vial is intended for institutional use only. Vials do not contain a preservative; discard unused portion. No recommendations for any changes at this time.  Herlene Fleeta Morris, PharmD, JAQUELINE, CPP Clinical Pharmacist [facility] & [facility] [phone]

## 2024-07-18 ENCOUNTER — Other Ambulatory Visit: Payer: Self-pay

## 2024-07-19 ENCOUNTER — Other Ambulatory Visit: Payer: Self-pay

## 2024-07-19 NOTE — Progress Notes (Signed)
 Specialty Pharmacy Ongoing Clinical Assessment Note  Billy Allen is a 23 y.o. male who is being followed by the specialty pharmacy service for RxSp Crohn's Disease   Patient's specialty medication(s) reviewed today: Adalimumab  (HUMIRA )   Missed doses in the last 4 weeks: 0   Patient/Caregiver did not have any additional questions or concerns.   Therapeutic benefit summary: Patient is achieving benefit   Adverse events/side effects summary: No adverse events/side effects   Patient's therapy is appropriate to: Continue    Goals Addressed             This Visit's Progress    Minimize recurrence of flares   On track    Patient is on track. Patient will maintain adherence         Follow up: 1 year  Powell CHRISTELLA Gallus Specialty Pharmacist

## 2024-07-19 NOTE — Progress Notes (Signed)
 Specialty Pharmacy Refill Coordination Note  Billy Allen is a 23 y.o. male contacted today regarding refills of specialty medication(s) Adalimumab  (HUMIRA )   Patient requested Delivery   Delivery date: 07/24/24   Verified address: [address]  [place] [address]   Medication will be filled on 07/23/24.

## 2024-07-20 ENCOUNTER — Other Ambulatory Visit: Payer: Self-pay

## 2024-07-20 ENCOUNTER — Other Ambulatory Visit (HOSPITAL_COMMUNITY): Payer: Self-pay

## 2024-07-24 ENCOUNTER — Other Ambulatory Visit (HOSPITAL_COMMUNITY): Payer: Self-pay

## 2024-07-24 ENCOUNTER — Other Ambulatory Visit: Payer: Self-pay | Admitting: Pharmacist

## 2024-07-24 ENCOUNTER — Other Ambulatory Visit: Payer: Self-pay

## 2024-07-24 DIAGNOSIS — K5010 Crohn's disease of large intestine without complications: Secondary | ICD-10-CM

## 2024-07-24 MED ORDER — ADALIMUMAB 40 MG/0.8ML ~~LOC~~ AJKT
40.0000 mg | AUTO-INJECTOR | SUBCUTANEOUS | 0 refills | Status: DC
  Filled 2024-07-24 (×2): qty 4, 28d supply, fill #0

## 2024-07-25 ENCOUNTER — Encounter (INDEPENDENT_AMBULATORY_CARE_PROVIDER_SITE_OTHER): Payer: Self-pay

## 2024-07-25 ENCOUNTER — Other Ambulatory Visit: Payer: Self-pay

## 2024-07-26 ENCOUNTER — Encounter (INDEPENDENT_AMBULATORY_CARE_PROVIDER_SITE_OTHER): Payer: Self-pay

## 2024-07-30 ENCOUNTER — Other Ambulatory Visit: Payer: Self-pay

## 2024-08-03 DIAGNOSIS — J3089 Other allergic rhinitis: Secondary | ICD-10-CM | POA: Diagnosis not present

## 2024-08-03 DIAGNOSIS — J3081 Allergic rhinitis due to animal (cat) (dog) hair and dander: Secondary | ICD-10-CM | POA: Diagnosis not present

## 2024-08-03 DIAGNOSIS — J301 Allergic rhinitis due to pollen: Secondary | ICD-10-CM | POA: Diagnosis not present

## 2024-08-15 ENCOUNTER — Other Ambulatory Visit (HOSPITAL_BASED_OUTPATIENT_CLINIC_OR_DEPARTMENT_OTHER): Payer: Self-pay

## 2024-08-15 ENCOUNTER — Other Ambulatory Visit: Payer: Self-pay | Admitting: Internal Medicine

## 2024-08-15 DIAGNOSIS — K5010 Crohn's disease of large intestine without complications: Secondary | ICD-10-CM

## 2024-08-15 MED ORDER — FLUZONE 0.5 ML IM SUSY
0.5000 mL | PREFILLED_SYRINGE | Freq: Once | INTRAMUSCULAR | 0 refills | Status: AC
  Filled 2024-08-15: qty 0.5, 1d supply, fill #0

## 2024-08-15 MED ORDER — COMIRNATY 30 MCG/0.3ML IM SUSY
0.3000 mL | PREFILLED_SYRINGE | Freq: Once | INTRAMUSCULAR | 0 refills | Status: AC
  Filled 2024-08-15: qty 0.3, 1d supply, fill #0

## 2024-08-16 ENCOUNTER — Telehealth: Payer: Self-pay | Admitting: Gastroenterology

## 2024-08-16 ENCOUNTER — Other Ambulatory Visit: Payer: Self-pay | Admitting: Gastroenterology

## 2024-08-16 ENCOUNTER — Other Ambulatory Visit: Payer: Self-pay

## 2024-08-16 ENCOUNTER — Other Ambulatory Visit (HOSPITAL_COMMUNITY): Payer: Self-pay

## 2024-08-16 DIAGNOSIS — K5010 Crohn's disease of large intestine without complications: Secondary | ICD-10-CM

## 2024-08-16 NOTE — Progress Notes (Signed)
 Specialty Pharmacy Refill Coordination Note  Billy Allen is a 23 y.o. male contacted today regarding refills of specialty medication(s) Adalimumab  (HUMIRA )   Patient requested (Patient-Rptd) Pickup at [facility] date: (Patient-Rptd) 08/29/24   Medication will be filled on 08/28/24.   This fill date is pending response to refill request from provider. Patient was made aware via mychart message and if they have not received fill by intended date they must follow up with pharmacy.

## 2024-08-16 NOTE — Telephone Encounter (Signed)
 PT is calling to discuss Humira  denial. Requesting to speak with a nurse.

## 2024-08-16 NOTE — Telephone Encounter (Signed)
  error

## 2024-08-16 NOTE — Progress Notes (Signed)
 Refill request denied, sent patient mychart message.

## 2024-08-17 ENCOUNTER — Other Ambulatory Visit: Payer: Self-pay

## 2024-08-17 ENCOUNTER — Other Ambulatory Visit (HOSPITAL_COMMUNITY): Payer: Self-pay

## 2024-08-17 MED ORDER — ADALIMUMAB 40 MG/0.8ML ~~LOC~~ AJKT
40.0000 mg | AUTO-INJECTOR | SUBCUTANEOUS | 0 refills | Status: DC
  Filled 2024-08-17: qty 4, 28d supply, fill #0

## 2024-08-17 NOTE — Telephone Encounter (Signed)
 Inbound call from patient stating Humira  denial. I went ahead and scheduled patient for next availability in office. Patient requesting medication to be sent to pharmacy. Please advise  Thank you

## 2024-08-17 NOTE — Telephone Encounter (Signed)
Humira Rx sent.

## 2024-08-23 ENCOUNTER — Other Ambulatory Visit (HOSPITAL_COMMUNITY): Payer: Self-pay

## 2024-08-23 ENCOUNTER — Other Ambulatory Visit: Payer: Self-pay

## 2024-08-23 NOTE — Progress Notes (Signed)
 Patient's mother called to see if she could pick up tomorrow (10/31).  Filling today for pick- up at [facility] tomorrow.

## 2024-08-24 ENCOUNTER — Other Ambulatory Visit (HOSPITAL_COMMUNITY): Payer: Self-pay

## 2024-08-24 DIAGNOSIS — J301 Allergic rhinitis due to pollen: Secondary | ICD-10-CM | POA: Diagnosis not present

## 2024-08-24 DIAGNOSIS — J3089 Other allergic rhinitis: Secondary | ICD-10-CM | POA: Diagnosis not present

## 2024-08-24 DIAGNOSIS — J3081 Allergic rhinitis due to animal (cat) (dog) hair and dander: Secondary | ICD-10-CM | POA: Diagnosis not present

## 2024-08-28 ENCOUNTER — Other Ambulatory Visit (HOSPITAL_COMMUNITY): Payer: Self-pay

## 2024-08-30 ENCOUNTER — Other Ambulatory Visit: Payer: Self-pay | Admitting: Pharmacist

## 2024-08-30 ENCOUNTER — Other Ambulatory Visit: Payer: Self-pay

## 2024-08-30 DIAGNOSIS — K5010 Crohn's disease of large intestine without complications: Secondary | ICD-10-CM

## 2024-08-30 MED ORDER — ADALIMUMAB 40 MG/0.8ML ~~LOC~~ AJKT
40.0000 mg | AUTO-INJECTOR | SUBCUTANEOUS | 0 refills | Status: DC
  Filled 2024-08-31 – 2024-09-15 (×2): qty 4, 28d supply, fill #0

## 2024-08-31 ENCOUNTER — Other Ambulatory Visit: Payer: Self-pay

## 2024-09-12 ENCOUNTER — Other Ambulatory Visit (HOSPITAL_COMMUNITY): Payer: Self-pay

## 2024-09-14 DIAGNOSIS — J301 Allergic rhinitis due to pollen: Secondary | ICD-10-CM | POA: Diagnosis not present

## 2024-09-14 DIAGNOSIS — J3089 Other allergic rhinitis: Secondary | ICD-10-CM | POA: Diagnosis not present

## 2024-09-14 DIAGNOSIS — J3081 Allergic rhinitis due to animal (cat) (dog) hair and dander: Secondary | ICD-10-CM | POA: Diagnosis not present

## 2024-09-15 ENCOUNTER — Other Ambulatory Visit (HOSPITAL_COMMUNITY): Payer: Self-pay

## 2024-09-17 ENCOUNTER — Other Ambulatory Visit: Payer: Self-pay

## 2024-09-17 NOTE — Progress Notes (Signed)
 Specialty Pharmacy Refill Coordination Note  Billy Allen is a 23 y.o. male contacted today regarding refills of specialty medication(s) Adalimumab  (HUMIRA )   Patient requested [facility] at [facility] [place] date: 09/19/24   Medication will be filled on: 09/18/24

## 2024-09-28 ENCOUNTER — Other Ambulatory Visit (HOSPITAL_COMMUNITY): Payer: Self-pay

## 2024-09-28 DIAGNOSIS — J301 Allergic rhinitis due to pollen: Secondary | ICD-10-CM | POA: Diagnosis not present

## 2024-09-28 DIAGNOSIS — J3089 Other allergic rhinitis: Secondary | ICD-10-CM | POA: Diagnosis not present

## 2024-09-28 DIAGNOSIS — J3081 Allergic rhinitis due to animal (cat) (dog) hair and dander: Secondary | ICD-10-CM | POA: Diagnosis not present

## 2024-10-01 ENCOUNTER — Other Ambulatory Visit (HOSPITAL_BASED_OUTPATIENT_CLINIC_OR_DEPARTMENT_OTHER): Payer: Self-pay

## 2024-10-01 ENCOUNTER — Other Ambulatory Visit (HOSPITAL_COMMUNITY): Payer: Self-pay

## 2024-10-01 MED ORDER — TRETINOIN 0.05 % EX CREA
TOPICAL_CREAM | CUTANEOUS | 2 refills | Status: AC
  Filled 2024-10-01: qty 20, 30d supply, fill #0

## 2024-10-02 ENCOUNTER — Ambulatory Visit: Admitting: Nurse Practitioner

## 2024-10-02 ENCOUNTER — Encounter: Payer: Self-pay | Admitting: Nurse Practitioner

## 2024-10-02 ENCOUNTER — Other Ambulatory Visit

## 2024-10-02 VITALS — BP 132/82 | HR 81 | Ht 69.0 in | Wt 197.5 lb

## 2024-10-02 DIAGNOSIS — Z79620 Long term (current) use of immunosuppressive biologic: Secondary | ICD-10-CM | POA: Diagnosis not present

## 2024-10-02 DIAGNOSIS — K5010 Crohn's disease of large intestine without complications: Secondary | ICD-10-CM

## 2024-10-02 DIAGNOSIS — K5080 Crohn's disease of both small and large intestine without complications: Secondary | ICD-10-CM | POA: Diagnosis not present

## 2024-10-02 LAB — CBC WITH DIFFERENTIAL/PLATELET
Basophils Absolute: 0.1 K/uL (ref 0.0–0.1)
Basophils Relative: 0.8 % (ref 0.0–3.0)
Eosinophils Absolute: 0.2 K/uL (ref 0.0–0.7)
Eosinophils Relative: 2.1 % (ref 0.0–5.0)
HCT: 41 % (ref 39.0–52.0)
Hemoglobin: 13.9 g/dL (ref 13.0–17.0)
Lymphocytes Relative: 39.8 % (ref 12.0–46.0)
Lymphs Abs: 3.6 K/uL (ref 0.7–4.0)
MCHC: 33.9 g/dL (ref 30.0–36.0)
MCV: 93.1 fl (ref 78.0–100.0)
Monocytes Absolute: 0.6 K/uL (ref 0.1–1.0)
Monocytes Relative: 7.1 % (ref 3.0–12.0)
Neutro Abs: 4.5 K/uL (ref 1.4–7.7)
Neutrophils Relative %: 50.2 % (ref 43.0–77.0)
Platelets: 287 K/uL (ref 150.0–400.0)
RBC: 4.41 Mil/uL (ref 4.22–5.81)
RDW: 12.2 % (ref 11.5–15.5)
WBC: 9 K/uL (ref 4.0–10.5)

## 2024-10-02 LAB — COMPREHENSIVE METABOLIC PANEL WITH GFR
ALT: 20 U/L (ref 0–53)
AST: 22 U/L (ref 0–37)
Albumin: 4.6 g/dL (ref 3.5–5.2)
Alkaline Phosphatase: 54 U/L (ref 39–117)
BUN: 20 mg/dL (ref 6–23)
CO2: 27 meq/L (ref 19–32)
Calcium: 9.3 mg/dL (ref 8.4–10.5)
Chloride: 103 meq/L (ref 96–112)
Creatinine, Ser: 1.13 mg/dL (ref 0.40–1.50)
GFR: 92.32 mL/min (ref >60.00)
Glucose, Bld: 77 mg/dL (ref 70–99)
Potassium: 3.9 meq/L (ref 3.5–5.1)
Sodium: 139 meq/L (ref 135–145)
Total Bilirubin: 0.4 mg/dL (ref 0.2–1.2)
Total Protein: 7.6 g/dL (ref 6.0–8.3)

## 2024-10-02 LAB — C-REACTIVE PROTEIN: CRP: <0.5 mg/dL (ref 0.5–20.0)

## 2024-10-02 LAB — VITAMIN D 25 HYDROXY (VIT D DEFICIENCY, FRACTURES): VITD: 27.12 ng/mL — ABNORMAL LOW (ref 30.00–100.00)

## 2024-10-02 LAB — SEDIMENTATION RATE: Sed Rate: 7 mm/h (ref 0–15)

## 2024-10-02 NOTE — Progress Notes (Signed)
 Agree with assessment / plan as outlined.

## 2024-10-02 NOTE — Patient Instructions (Signed)
 Your provider has requested that you go to the basement level for lab work before leaving today. Press B on the elevator. The lab is located at the first door on the left as you exit the elevator.  Continue Humira  weekly  Avoid NSAIDS  _______________________________________________________  If your blood pressure at your visit was 140/90 or greater, please contact your primary care physician to follow up on this.  _______________________________________________________  If you are age 22 or older, your body mass index should be between 23-30. Your Body mass index is 29.17 kg/m. If this is out of the aforementioned range listed, please consider follow up with your Primary Care Provider.  If you are age 41 or younger, your body mass index should be between 19-25. Your Body mass index is 29.17 kg/m. If this is out of the aformentioned range listed, please consider follow up with your Primary Care Provider.   ________________________________________________________  The [facility] providers would like to encourage you to use MYCHART to communicate with providers for non-urgent requests or questions.  Due to long hold times on the telephone, sending your provider a message by [facility] may be a faster and more efficient way to get a response.  Please allow 48 business hours for a response.  Please remember that this is for non-urgent requests.  _______________________________________________________  [facility] is using a team-based approach to care.  Your team is made up of your doctor and two to three APPS. Our APPS (Nurse Practitioners and Physician Assistants) work with your physician to ensure care continuity for you. They are fully qualified to address your health concerns and develop a treatment plan. They communicate directly with your gastroenterologist to care for you. Seeing the Advanced Practice Practitioners on your physician's team can help you by facilitating care more  promptly, often allowing for earlier appointments, access to diagnostic testing, procedures, and other specialty referrals.

## 2024-10-02 NOTE — Progress Notes (Signed)
 10/02/2024 GREGG WINCHELL [id number] May 15, 2001   Chief Complaint: Crohn's disease follow up  History of Present Illness: Billy Allen is a 23 year old male with a past history of ileocolonic Crohn's disease initially diagnosed 01/2020.  He is followed by Dr. Leigh.  He presents today for routine Crohn's follow-up.  He remains on Humira  40 mg injection once weekly.  He denies having any abdominal pain.  He is passing a normal formed brown stool daily.  No blood or mucus per the rectum.  He endorsed receiving the flu injection and COVID vaccine 1 or 2 months ago.  Infrequent NSAID use.  Appetite is good.  Weight is stable.   Crohn's disease history: Dx of Crohn's colitis when he presented in April 2021 with severe diarrhea. Previously followed by Dr. Leatrice at [facility].  Initially treated with methotrexate  and folic acid, colonoscopy June 2022 showed active disease.  He was started on Humira  at that time.    Colonoscopy June 2023 showed some mildly active colitis and his Humira  levels with subtherapeutic with no antibodies so they increased his Humira  to once weekly dosing.   Visit here was in May 2024 and fecal calprotectin at that time was normal.  He was feeling well.   He has since developed a perianal abscess that was treated by surgery back in October.  Fecal calprotectin in November was elevated 159.   Visit 11/2023:  Since resolution of that perianal abscess he has had no issues.  He denies any gut complaints.  Says that he is moving his bowels regularly about once a day.  No rectal bleeding.  No abdominal pain.  He says pretty much eats what he wants.  He did have his flu shot this year.  He had a low-grade fever between 100.4 and 101 degrees for a few days last week, the last that occurred was a couple of days ago.  No other upper respiratory symptoms.  CBC at an urgent care was normal.  He is due for his Humira  injection today.  No other joint/skin/eye complaints.    Humira  Ab and levels good in February 2025.   Fecal calprotectin elevated at 917 in April 2025 but had been off of Humira  for 6 weeks because of having fever and Bartonella infection that required several weeks of antibiotics.  Repeat fecal calprotectin level 1,020 on 05/09/2024.  He was last seen in office by Harlene Rand PA-C 05/15/2024 and at that time he was passing 1-2 formed stools daily without experiencing any abdominal pain.  Small bowel MRI 05/30/2024 identified prominent RLQ lymph nodes in the ileocolic mesentery, nonspecific without bowel inflammation.    His most recent colonoscopy 05/22/2024 showed chronically mild active colitis to the right colon, transverse colon and left colon.      Latest Ref Rng & Units 01/09/2024    8:49 AM 01/06/2024    2:24 PM 01/02/2024    6:05 PM  CBC  WBC 4.0 - 10.5 K/uL 9.9  13.3  13.3   Hemoglobin 13.0 - 17.0 g/dL 87.7  88.0  88.0   Hematocrit 39.0 - 52.0 % 36.8  36.1  34.5   Platelets 150.0 - 400.0 K/uL 565.0  461.0  286        Latest Ref Rng & Units 01/09/2024    8:49 AM 01/06/2024    2:24 PM 01/02/2024    6:05 PM  CMP  Glucose 70 - 99 mg/dL 79  83  93  BUN 6 - 23 mg/dL 11  9  12    Creatinine 0.40 - 1.50 mg/dL 9.25  9.16  9.20   Sodium 135 - 145 mEq/L 137  132  131   Potassium 3.5 - 5.1 mEq/L 4.0  4.1  3.3   Chloride 96 - 112 mEq/L 103  97  97   CO2 19 - 32 mEq/L 24  26  21    Calcium 8.4 - 10.5 mg/dL 89.9  9.4  9.3   Total Protein 6.0 - 8.3 g/dL 9.6  8.8  8.2   Total Bilirubin 0.2 - 1.2 mg/dL 1.0  0.6  1.0   Alkaline Phos 39 - 117 U/L 89  101  79   AST 0 - 37 U/L 20  34  33   ALT 0 - 53 U/L 57  66  52     Quantiferon gold: 12/29/2023:  Small bowel MRI without contrast 05/30/2024: FINDINGS: Minimal distension of multiple loops of small bowel and large volume of stool throughout the colon limits examination. Within this context:   COMBINED FINDINGS FOR BOTH MR ABDOMEN AND PELVIS   Lower chest: Minimally evaluated.    Hepatobiliary: Limited evaluation of the liver on this examination as protocol for evaluation of the enteric structures. Previously identified hepatic lesions are not confidently seen or well evaluated on this examination. Noninflamed gallbladder. No biliary ductal dilation.   Pancreas: Limited evaluation reveals no pancreatic ductal dilation or evidence of acute inflammation.   Spleen:  No splenomegaly.   Adrenals/Urinary Tract: No adrenal nodule/mass identified. No hydronephrosis.   Stomach/Bowel: Stomach is unremarkable for degree of distension within the limitation of the examination. Minimal distension of bowel loops. No pathologic dilation of small or large bowel. No evidence of acute bowel inflammation. Colonic stool burden compatible with constipation. Noninflamed appendix.   Vascular/Lymphatic: Prominent right lower quadrant lymph nodes for instance in the ileocolic mesentery measuring 6 mm in short axis on image 31/13   Reproductive: No acute finding.   Other: No significant abdominopelvic free fluid. No evidence of perianal disease although evaluation is limited on this study as protocol.   Musculoskeletal: No suspicious bone lesions identified.   IMPRESSION: Minimal distension of multiple loops of small bowel and large volume of stool throughout the colon limits examination. Within this context:   1. No evidence of acute bowel inflammation. 2. Prominent right lower quadrant lymph nodes for instance in the ileocolic mesentery measuring 6 mm in short axis, nonspecific. 3. Previously identified hepatic lesions are not confidently seen or well evaluated on this examination.  Endoscopic history:  EGD 03/2021: - Normal esophagus.  - Normal stomach. Biopsied.  - Normal examined duodenum. Biopsied.   - Normal esophagus. Biopsied.    Colonoscopy 03/2021: The perianal and digital rectal examinations were normal.  The terminal ileum appeared normal. Estimated  blood loss was minimal.  Biopsies were taken with a cold forceps for histology.  Inflammation characterized by multiple shallow ulcerations was found as patches in the transverse colon and in the ascending colon. The anus, the rectum, the recto-sigmoid colon, the sigmoid colon, the descending colon, the splenic flexure and the distal transverse colon were spared. The inflammation was moderate in severity. Biopsies were taken with a cold forceps for histology. Estimated blood loss was minimal.    Diagnosis     A: Stomach, biopsy - Gastric mucosa with no diagnostic alterations including no Helicobacter pylori organisms seen   B: Small bowel, duodenum, biopsy - Duodenal mucosa with no diagnostic  alterations including no evidence of celiac disease   C: Esophagus, biopsy - Fragments of esophageal squamous epithelium with no diagnostic alterations including no increased eosinophil infiltrate   D: Colon, right, biopsy - Chronic active colitis with mild to moderate activity, consistent with the patient's clinical history of Crohn's disease - No evidence of dysplasia   E: Colon, transverse, biopsy - Chronic active colitis with moderate activity and granulomatous formation, consistent with the patient's clinical history of Crohn's disease - No evidence of dysplasia   F: Colon, left, biopsy - Chronic colitis with focal mild activity and granulomatous formation, consistent with the patient's clinical history of Crohn's disease - No evidence of dysplasia   G: Small bowel, terminal ileum, biopsy - Small bowel mucosa with prominent lymphoid aggregates, consistent with ileal mucosa with no diagnostic alterations     EGD 01/2020:    - Localized nodularity of the distal esophagus,        otherwise normal esophagus. Biopsied.      - Nodular mucosa in the gastric antrum. Biopsied.      - Normal examined duodenum. Biopsied.    Colonoscopy 01/2020: - Congested, erythematous areas of colonic mucosa with  apthae. Biopsied.  - The examined portion of the ileum was normal.  Biopsied.    A: Stomach, biopsy - Chronic superficial active gastritis with rare intramucosal granulomas compatible with involvement by patient's known history of inflammatory bowel disease - Reactive foveolar hyperplasia - An immunohistochemical stain for H. Pylori has been ordered and the results will be issued in an addendum - No evidence of malignancy, dysplasia, or metaplasia   B: Esophagus, biopsy - Superficial strips of benign squamous mucosa - No evidence of dysplasia or metaplasia   C: Small bowel, duodenum, biopsy - Acute and chronic duodenitis compatible with involvement by patient's known history of inflammatory bowel disease - No evidence of malignancy or dysplasia   D: Colon, right, biopsy - Focally active chronic colitis consistent with involvement by patient's known history of inflammatory bowel disease - No evidence of malignancy, dysplasia, granulomas, or viral cytopathic effect   E: Colon, transverse, biopsy - Focally active chronic colitis consistent with involvement by patient's known history of inflammatory bowel disease - No evidence of malignancy, dysplasia, granulomas, or viral cytopathic effect   F: Colon, left, biopsy - Features of chronic mucosal injury - No evidence of active colitis in this specimen - No evidence of malignancy, dysplasia, granulomas, or viral cytopathic effect   G: Small bowel, terminal ileum, biopsy - Focally active chronic ileitis compatible with involvement by patient's known history of inflammatory bowel disease - No evidence of malignancy, dysplasia, granulomas, or viral cytopathic effect     Colonoscopy 04/09/22: The perianal and digital rectal examinations were normal. - The terminal ileum appeared normal. - Patchy very faint inflammatory characterized by granularity and loss of vascularity was found in the sigmoid colon, in the transverse colon and in the  ascending colon. No ulcerations or erosions noted. - Internal hemorrhoids were found during retroflexion. The hemorrhoids were small. - The exam was otherwise without abnormality. - Biopsies were taken with a cold forceps for histology from right, transverse, and left colon.     1. Surgical [P], right colon biopsies MILD TO MODERATE ACTIVE COLITIS WITH ARCHITECTURAL DISARRAY. LYMPHOPLASMACYTIC INFILTRATE AND LYMPHOID AGGREGATES IN THE LAMINA PROPRIA CONSISTENT WITH CLINICAL HISTORY OF CROHN'S DISEASE. NO DYSPLASIA, GRANULOMATOUS INFLAMMATION OR MALIGNANCY IS SEEN. 2. Surgical [P], transverse colon biopsies MILD ACTIVE COLITIS WITH ARCHITECTURAL DISARRAY. LYMPHOPLASMACYTIC INFILTRATE  WITH LYMPHOID AGGREGATES ARE CONSISTENT WITH CLINICAL HISTORY OF CROHN'S DISEASE. NO DYSPLASIA, GRANULOMATOUS INFLAMMATION OR MALIGNANCY IS SEEN. 3. Surgical [P], left colon and rectal biopsies MILD ACTIVE COLITIS WITH ARCHITECTURAL DISARRAY. PROMINENT LYMPHOPLASMACYTIC INFILTRATE AND LYMPHOID AGGREGATES ARE SEEN CONSISTENT WITH CLINICAL HISTORY OF CROHN'S DISEASE. NO DYSPLASIA, GRANULOMATOUS INFLAMMATION OR MALIGNANCY IS SEEN. COMMENT: IMMUNOHISTOCHEMISTRY WITH APPROPRIATE CONTROLS FOR CD3 AND CD20 WAS PERFORMED ON ALL 3 BIOPSIES AND CONFIRMS THE POLYMORPHOUS NATURE OF THE LYMPHOID AGGREGATES. IMMUNOHISTOCHEMISTRY FOR CMV IS BEING ORDERED AND WILL BE REPORTED IN AN ADDENDUM. CLINICAL AND RADIOLOGICAL CORRELATION IS REQUIRED. THE CASE WAS DISCUSSED IN INTRADEPARTMENTAL CONSULTATION WITH DR. MARK LEGOLVAN AND DR. SMIR WHO CONCUR WITH THE FINDINGS.   Fecal calpro 07/2022 - 39 and November 2024 was 159.     Humira  Ab and levels good in February 2025.   Fecal calprotectin elevated at 917 in April 2025 but had been off of Humira  for 6 weeks because of having fever and Bartonella infection that required several weeks of antibiotics.    Colonoscopy 05/22/2024: - The examined portion of the ileum was normal.   - The examination was otherwise normal. No active inflammation seen anywhere.  - Biopsies were taken with a cold forceps for histology.  1. Surgical [P], right colon :       - CHRONIC MILDLY ACTIVE COLITIS       - NEGATIVE FOR DYSPLASIA OR MALIGNANCY        2. Surgical [P], colon, transverse :       - CHRONIC MILDLY ACTIVE COLITIS       - NEGATIVE FOR DYSPLASIA OR MALIGNANCY        3. Surgical [P], left colon :       - CHRONIC MILDLY ACTIVE COLITIS       - NEGATIVE FOR DYSPLASIA OR MALIGNANCY   . Current Outpatient Medications on File Prior to Visit  Medication Sig Dispense Refill   acetaminophen  (TYLENOL ) 325 MG tablet Take 650 mg by mouth every 6 (six) hours as needed.     adalimumab  (HUMIRA ) 40 MG/0.8ML AJKT pen Inject 0.8 mLs (40 mg total) into the skin once a week. PLEASE KEEP 09/2024 OFFICE VISIT FOR ADDITIONAL REFILLS 4 each 0   Crisaborole  (EUCRISA ) 2 % OINT Apply twice daily to affected areas of skin. Once control noted, switch to once daily 100 g 2   EPINEPHrine  (EPIPEN  2-PAK) 0.3 mg/0.3 mL IJ SOAJ injection Inject one epipen  into the thigh muscle as needed for systemic reaction. May repeat dose once if needed 2 each 1   escitalopram  (LEXAPRO ) 5 MG tablet Take 1 tablet (5 mg total) by mouth at bedtime. 90 tablet 1   finasteride  (PROPECIA ) 1 MG tablet Take 1 tablet (1 mg total) by mouth daily. 90 tablet 3   fluticasone  (FLONASE ) 50 MCG/ACT nasal spray Use 2 sprays into both nostrils daily 16 g 5   loratadine (CLARITIN REDITABS) 10 MG dissolvable tablet Take by mouth.     loratadine (CLARITIN) 10 MG tablet Take 10 mg by mouth daily.     tretinoin  (RETIN-A ) 0.05 % cream Use a few nights a week and increase to nightly as tolerated. 20 g 2   triamcinolone  cream (KENALOG ) 0.1 % Apply 1 small Application topically to affected area 2 (two) times daily for eczema. 80 g 1   [DISCONTINUED] amitriptyline (ELAVIL) 25 MG tablet Take 1 tablet (25 mg total) by mouth at bedtime as needed for  sleep. 30 tablet 5   No current  facility-administered medications on file prior to visit.   No Known Allergies  Current Medications, Allergies, Past Medical History, Past Surgical History, Family History and Social History were reviewed in [employer] record.  Review of Systems:   Constitutional: Negative for fever, sweats, chills or weight loss.  Respiratory: Negative for shortness of breath.   Cardiovascular: Negative for chest pain, palpitations and leg swelling.  Gastrointestinal: See HPI.  Musculoskeletal: Negative for back pain or muscle aches.  Neurological: Negative for dizziness, headaches or paresthesias.   Physical Exam: BP 132/82   Pulse 81   Ht 5' 9 (1.753 m)   Wt 197 lb 8 oz (89.6 kg)   BMI 29.17 kg/m  General: 23 year old male in no acute distress. Head: Normocephalic and atraumatic. Eyes: No scleral icterus. Conjunctiva pink . Ears: Normal auditory acuity. Mouth: Dentition intact. No ulcers or lesions.  Lungs: Clear throughout to auscultation. Heart: Regular rate and rhythm, no murmur. Abdomen: Soft, nontender and nondistended. No masses or hepatomegaly. Normal bowel sounds x 4 quadrants.  Rectal: Deferred. Musculoskeletal: Symmetrical with no gross deformities. Extremities: No edema. Neurological: Alert oriented x 4. No focal deficits.  Psychological: Alert and cooperative. Normal mood and affect  Assessment and Recommendations:  23 year old male  with ileocolonic Crohn's disease.  Asymptomatic.  On Humira  40 mg injection once weekly.  Fecal calprotectin level 1,020 July 2025.  Small bowel MRI identified prominent RLQ lymph nodes in the ileocolic mesentery, nonspecific without bowel inflammation.  His most recent colonoscopy 05/22/2024 showed chronically mild active colitis to the right colon, transverse colon and left colon. - CBC, CMP, CRP, sed rate and vitamin D  level - Fecal calprotectin level to be completed 10/2024 as previously  recommended by Dr. Leigh - Continue Humira  40 mg injections once weekly - Due for QuantiFERON gold level 12/2024 - Avoid NSAID - Further recommendation to be determined after the above lab results reviewed  Today's encounter was 25 minutes which included precharting, chart/result review, history/exam, face-to-face time used for counseling, formulating a treatment plan with follow-up and documentation.

## 2024-10-04 ENCOUNTER — Ambulatory Visit: Payer: Self-pay | Admitting: Nurse Practitioner

## 2024-10-04 DIAGNOSIS — Z111 Encounter for screening for respiratory tuberculosis: Secondary | ICD-10-CM

## 2024-10-04 DIAGNOSIS — E559 Vitamin D deficiency, unspecified: Secondary | ICD-10-CM

## 2024-10-09 ENCOUNTER — Other Ambulatory Visit: Payer: Self-pay | Admitting: Pharmacist

## 2024-10-09 ENCOUNTER — Other Ambulatory Visit: Payer: Self-pay

## 2024-10-09 ENCOUNTER — Other Ambulatory Visit (HOSPITAL_COMMUNITY): Payer: Self-pay

## 2024-10-09 ENCOUNTER — Other Ambulatory Visit: Payer: Self-pay | Admitting: Gastroenterology

## 2024-10-09 DIAGNOSIS — K5010 Crohn's disease of large intestine without complications: Secondary | ICD-10-CM

## 2024-10-09 MED ORDER — HUMIRA (2 PEN) 40 MG/0.8ML ~~LOC~~ AJKT
40.0000 mg | AUTO-INJECTOR | SUBCUTANEOUS | 2 refills | Status: AC
  Filled 2024-10-09 – 2024-10-10 (×2): qty 4, 28d supply, fill #0
  Filled 2024-11-06: qty 4, 28d supply, fill #1

## 2024-10-09 MED ORDER — HUMIRA (2 PEN) 40 MG/0.8ML ~~LOC~~ AJKT
40.0000 mg | AUTO-INJECTOR | SUBCUTANEOUS | 2 refills | Status: DC
  Filled 2024-10-09: qty 4, 28d supply, fill #0

## 2024-10-10 ENCOUNTER — Other Ambulatory Visit: Payer: Self-pay

## 2024-10-11 ENCOUNTER — Other Ambulatory Visit: Payer: Self-pay

## 2024-10-11 NOTE — Progress Notes (Signed)
 Specialty Pharmacy Refill Coordination Note  Billy Allen is a 23 y.o. male contacted today regarding refills of specialty medication(s) Adalimumab  (Humira  (2 Pen))   Patient requested (Patient-Rptd) Delivery   Delivery date: 10/17/24   Verified address: (Patient-Rptd) [phone]   Medication will be filled on: 10/16/24

## 2024-10-12 DIAGNOSIS — J301 Allergic rhinitis due to pollen: Secondary | ICD-10-CM | POA: Diagnosis not present

## 2024-10-12 DIAGNOSIS — J3089 Other allergic rhinitis: Secondary | ICD-10-CM | POA: Diagnosis not present

## 2024-10-12 DIAGNOSIS — J3081 Allergic rhinitis due to animal (cat) (dog) hair and dander: Secondary | ICD-10-CM | POA: Diagnosis not present

## 2024-10-30 NOTE — Telephone Encounter (Signed)
-----   Message from [facility] Clarita H sent at 06/04/2024  8:38 PM EDT ----- Regarding: fecal calprotectin due in Jan Patient due for fecal calprotectin in mid January

## 2024-10-30 NOTE — Telephone Encounter (Signed)
 MyChart message to patient to go to the lab for fecal calprotectin

## 2024-11-06 ENCOUNTER — Other Ambulatory Visit (HOSPITAL_COMMUNITY): Payer: Self-pay

## 2024-11-06 ENCOUNTER — Other Ambulatory Visit: Payer: Self-pay

## 2024-11-06 ENCOUNTER — Encounter (HOSPITAL_COMMUNITY): Payer: Self-pay

## 2024-11-06 NOTE — Progress Notes (Signed)
 Specialty Pharmacy Refill Coordination Note  MyChart Questionnaire Submission  Billy Allen is a 24 y.o. male contacted today regarding refills of specialty medication(s) Humira .  Doses on hand: 1 for 11/11/24   Next inj: 11/18/24  Patient requested: (Patient-Rptd) Delivery   Delivery date: 11/16/24  Verified address: [address] [place] [address]  Medication will be filled on 11/15/24

## 2024-11-12 ENCOUNTER — Other Ambulatory Visit (HOSPITAL_COMMUNITY): Payer: Self-pay

## 2024-11-14 ENCOUNTER — Other Ambulatory Visit: Payer: Self-pay
